# Patient Record
Sex: Male | Born: 1960 | Race: White | Hispanic: No | Marital: Married | Smoking: Never smoker
Health system: Southern US, Community
[De-identification: ages and names within clinical notes are randomized; demographics above are authoritative.]

## PROBLEM LIST (undated history)

## (undated) DIAGNOSIS — E785 Hyperlipidemia, unspecified: Secondary | ICD-10-CM

## (undated) DIAGNOSIS — G4730 Sleep apnea, unspecified: Secondary | ICD-10-CM

## (undated) DIAGNOSIS — I10 Essential (primary) hypertension: Secondary | ICD-10-CM

## (undated) HISTORY — DX: Hyperlipidemia, unspecified: E78.5

## (undated) HISTORY — DX: Sleep apnea, unspecified: G47.30

## (undated) HISTORY — PX: KNEE SURGERY: SHX244

---

## 1998-05-30 HISTORY — PX: NECK SURGERY: SHX720

## 2001-08-04 ENCOUNTER — Inpatient Hospital Stay (HOSPITAL_COMMUNITY): Admission: EM | Admit: 2001-08-04 | Discharge: 2001-08-06 | Payer: Self-pay | Admitting: Emergency Medicine

## 2001-08-04 ENCOUNTER — Encounter: Payer: Self-pay | Admitting: Emergency Medicine

## 2002-03-11 ENCOUNTER — Encounter: Payer: Self-pay | Admitting: Orthopaedic Surgery

## 2002-03-11 ENCOUNTER — Ambulatory Visit (HOSPITAL_COMMUNITY): Admission: RE | Admit: 2002-03-11 | Discharge: 2002-03-11 | Payer: Self-pay | Admitting: Orthopaedic Surgery

## 2004-05-30 HISTORY — PX: APPENDECTOMY: SHX54

## 2014-02-07 ENCOUNTER — Emergency Department (HOSPITAL_COMMUNITY)
Admission: EM | Admit: 2014-02-07 | Discharge: 2014-02-07 | Disposition: A | Discharge status: Home / Self Care | Payer: 59 | Attending: Emergency Medicine | Admitting: Emergency Medicine

## 2014-02-07 ENCOUNTER — Encounter (HOSPITAL_COMMUNITY): Payer: Self-pay | Admitting: Emergency Medicine

## 2014-02-07 ENCOUNTER — Emergency Department (HOSPITAL_COMMUNITY): Payer: 59

## 2014-02-07 DIAGNOSIS — R079 Chest pain, unspecified: Secondary | ICD-10-CM | POA: Insufficient documentation

## 2014-02-07 DIAGNOSIS — I10 Essential (primary) hypertension: Secondary | ICD-10-CM | POA: Insufficient documentation

## 2014-02-07 DIAGNOSIS — R0602 Shortness of breath: Secondary | ICD-10-CM | POA: Diagnosis not present

## 2014-02-07 HISTORY — DX: Essential (primary) hypertension: I10

## 2014-02-07 LAB — CBC WITH DIFFERENTIAL/PLATELET
BASOS ABS: 0.1 10*3/uL (ref 0.0–0.1)
BASOS PCT: 1 % (ref 0–1)
EOS PCT: 2 % (ref 0–5)
Eosinophils Absolute: 0.2 10*3/uL (ref 0.0–0.7)
HEMATOCRIT: 44.2 % (ref 39.0–52.0)
HEMOGLOBIN: 15.7 g/dL (ref 13.0–17.0)
Lymphocytes Relative: 26 % (ref 12–46)
Lymphs Abs: 2 10*3/uL (ref 0.7–4.0)
MCH: 30.3 pg (ref 26.0–34.0)
MCHC: 35.5 g/dL (ref 30.0–36.0)
MCV: 85.2 fL (ref 78.0–100.0)
MONO ABS: 0.6 10*3/uL (ref 0.1–1.0)
MONOS PCT: 8 % (ref 3–12)
Neutro Abs: 5 10*3/uL (ref 1.7–7.7)
Neutrophils Relative %: 63 % (ref 43–77)
Platelets: 243 10*3/uL (ref 150–400)
RBC: 5.19 MIL/uL (ref 4.22–5.81)
RDW: 12.9 % (ref 11.5–15.5)
WBC: 7.8 10*3/uL (ref 4.0–10.5)

## 2014-02-07 LAB — COMPREHENSIVE METABOLIC PANEL
ALT: 20 U/L (ref 0–53)
ANION GAP: 11 (ref 5–15)
AST: 18 U/L (ref 0–37)
Albumin: 4 g/dL (ref 3.5–5.2)
Alkaline Phosphatase: 82 U/L (ref 39–117)
BILIRUBIN TOTAL: 0.5 mg/dL (ref 0.3–1.2)
BUN: 10 mg/dL (ref 6–23)
CHLORIDE: 109 meq/L (ref 96–112)
CO2: 25 mEq/L (ref 19–32)
CREATININE: 1.06 mg/dL (ref 0.50–1.35)
Calcium: 8.8 mg/dL (ref 8.4–10.5)
GFR calc Af Amer: 89 mL/min — ABNORMAL LOW (ref >90)
GFR, EST NON AFRICAN AMERICAN: 77 mL/min — AB (ref >90)
Glucose, Bld: 89 mg/dL (ref 70–99)
Potassium: 4.1 mEq/L (ref 3.7–5.3)
Sodium: 145 mEq/L (ref 137–147)
Total Protein: 6.9 g/dL (ref 6.0–8.3)

## 2014-02-07 LAB — TROPONIN I

## 2014-02-07 MED ORDER — NITROGLYCERIN 0.4 MG SL SUBL
0.4000 mg | SUBLINGUAL_TABLET | SUBLINGUAL | Status: DC | PRN
  Administered 2014-02-07 (×3): 0.4 mg via SUBLINGUAL
  Filled 2014-02-07: qty 1

## 2014-02-07 MED ORDER — AMLODIPINE BESYLATE 5 MG PO TABS: 5.0000 mg | ORAL_TABLET | Freq: Every day | ORAL | Status: DC

## 2014-02-07 MED ORDER — ASPIRIN 81 MG PO CHEW
324.0000 mg | CHEWABLE_TABLET | Freq: Once | ORAL | Status: AC
Start: 2014-02-07 — End: 2014-02-07
  Administered 2014-02-07: 324 mg via ORAL
  Filled 2014-02-07: qty 4

## 2014-02-07 NOTE — Discharge Instructions (Signed)
Chest Pain (Nonspecific) °It is often hard to give a specific diagnosis for the cause of chest pain. There is always a chance that your pain could be related to something serious, such as a heart attack or a blood clot in the lungs. You need to follow up with your health care provider for further evaluation. °CAUSES  °· Heartburn. °· Pneumonia or bronchitis. °· Anxiety or stress. °· Inflammation around your heart (pericarditis) or lung (pleuritis or pleurisy). °· A blood clot in the lung. °· A collapsed lung (pneumothorax). It can develop suddenly on its own (spontaneous pneumothorax) or from trauma to the chest. °· Shingles infection (herpes zoster virus). °The chest wall is composed of bones, muscles, and cartilage. Any of these can be the source of the pain. °· The bones can be bruised by injury. °· The muscles or cartilage can be strained by coughing or overwork. °· The cartilage can be affected by inflammation and become sore (costochondritis). °DIAGNOSIS  °Lab tests or other studies may be needed to find the cause of your pain. Your health care provider may have you take a test called an ambulatory electrocardiogram (ECG). An ECG records your heartbeat patterns over a 24-hour period. You may also have other tests, such as: °· Transthoracic echocardiogram (TTE). During echocardiography, sound waves are used to evaluate how blood flows through your heart. °· Transesophageal echocardiogram (TEE). °· Cardiac monitoring. This allows your health care provider to monitor your heart rate and rhythm in real time. °· Holter monitor. This is a portable device that records your heartbeat and can help diagnose heart arrhythmias. It allows your health care provider to track your heart activity for several days, if needed. °· Stress tests by exercise or by giving medicine that makes the heart beat faster. °TREATMENT  °· Treatment depends on what may be causing your chest pain. Treatment may include: °¨ Acid blockers for  heartburn. °¨ Anti-inflammatory medicine. °¨ Pain medicine for inflammatory conditions. °¨ Antibiotics if an infection is present. °· You may be advised to change lifestyle habits. This includes stopping smoking and avoiding alcohol, caffeine, and chocolate. °· You may be advised to keep your head raised (elevated) when sleeping. This reduces the chance of acid going backward from your stomach into your esophagus. °Most of the time, nonspecific chest pain will improve within 2-3 days with rest and mild pain medicine.  °HOME CARE INSTRUCTIONS  °· If antibiotics were prescribed, take them as directed. Finish them even if you start to feel better. °· For the next few days, avoid physical activities that bring on chest pain. Continue physical activities as directed. °· Do not use any tobacco products, including cigarettes, chewing tobacco, or electronic cigarettes. °· Avoid drinking alcohol. °· Only take medicine as directed by your health care provider. °· Follow your health care provider's suggestions for further testing if your chest pain does not go away. °· Keep any follow-up appointments you made. If you do not go to an appointment, you could develop lasting (chronic) problems with pain. If there is any problem keeping an appointment, call to reschedule. °SEEK MEDICAL CARE IF:  °· Your chest pain does not go away, even after treatment. °· You have a rash with blisters on your chest. °· You have a fever. °SEEK IMMEDIATE MEDICAL CARE IF:  °· You have increased chest pain or pain that spreads to your arm, neck, jaw, back, or abdomen. °· You have shortness of breath. °· You have an increasing cough, or you cough   up blood.  You have severe back or abdominal pain.  You feel nauseous or vomit.  You have severe weakness.  You faint.  You have chills. This is an emergency. Do not wait to see if the pain will go away. Get medical help at once. Call your local emergency services (911 in [place] Do not drive  yourself to the hospital. MAKE SURE YOU:   Understand these instructions.  Will watch your condition.  Will get help right away if you are not doing well or get worse. Document Released: 02/23/2005 Document Revised: 05/21/2013 Document Reviewed: 12/20/2007 [employer] Patient Information 2015 [place], [place]. This information is not intended to replace advice given to you by your health care provider. Make sure you discuss any questions you have with your health care provider.  Hypertension Hypertension, commonly called high blood pressure, is when the force of blood pumping through your arteries is too strong. Your arteries are the blood vessels that carry blood from your heart throughout your body. A blood pressure reading consists of a higher number over a lower number, such as 110/72. The higher number (systolic) is the pressure inside your arteries when your heart pumps. The lower number (diastolic) is the pressure inside your arteries when your heart relaxes. Ideally you want your blood pressure below 120/80. Hypertension forces your heart to work harder to pump blood. Your arteries may become narrow or stiff. Having hypertension puts you at risk for heart disease, stroke, and other problems.  RISK FACTORS Some risk factors for high blood pressure are controllable. Others are not.  Risk factors you cannot control include:   Race. You may be at higher risk if you are African American.  Age. Risk increases with age.  Gender. Men are at higher risk than women before age 17 years. After age 24, women are at higher risk than men. Risk factors you can control include:  Not getting enough exercise or physical activity.  Being overweight.  Getting too much fat, sugar, calories, or salt in your diet.  Drinking too much alcohol. SIGNS AND SYMPTOMS Hypertension does not usually cause signs or symptoms. Extremely high blood pressure (hypertensive crisis) may cause headache, anxiety, shortness  of breath, and nosebleed. DIAGNOSIS  To check if you have hypertension, your health care provider will measure your blood pressure while you are seated, with your arm held at the level of your heart. It should be measured at least twice using the same arm. Certain conditions can cause a difference in blood pressure between your right and left arms. A blood pressure reading that is higher than normal on one occasion does not mean that you need treatment. If one blood pressure reading is high, ask your health care provider about having it checked again. TREATMENT  Treating high blood pressure includes making lifestyle changes and possibly taking medicine. Living a healthy lifestyle can help lower high blood pressure. You may need to change some of your habits. Lifestyle changes may include:  Following the DASH diet. This diet is high in fruits, vegetables, and whole grains. It is low in salt, red meat, and added sugars.  Getting at least 2 hours of brisk physical activity every week.  Losing weight if necessary.  Not smoking.  Limiting alcoholic beverages.  Learning ways to reduce stress. If lifestyle changes are not enough to get your blood pressure under control, your health care provider may prescribe medicine. You may need to take more than one. Work closely with your health care provider  understand the risks and benefits. °HOME CARE INSTRUCTIONS °· Have your blood pressure rechecked as directed by your health care provider.   °· Take medicines only as directed by your health care provider. Follow the directions carefully. Blood pressure medicines must be taken as prescribed. The medicine does not work as well when you skip doses. Skipping doses also puts you at risk for problems.   °· Do not smoke.   °· Monitor your blood pressure at home as directed by your health care provider.  °SEEK MEDICAL CARE IF:  °· You think you are having a reaction to medicines taken. °· You have recurrent  headaches or feel dizzy. °· You have swelling in your ankles. °· You have trouble with your vision. °SEEK IMMEDIATE MEDICAL CARE IF: °· You develop a severe headache or confusion. °· You have unusual weakness, numbness, or feel faint. °· You have severe chest or abdominal pain. °· You vomit repeatedly. °· You have trouble breathing. °MAKE SURE YOU:  °· Understand these instructions. °· Will watch your condition. °· Will get help right away if you are not doing well or get worse. °Document Released: 05/16/2005 Document Revised: 09/30/2013 Document Reviewed: 03/08/2013 °[employer]® Patient Information ©2015 [employer]. This information is not intended to replace advice given to you by your health care provider. Make sure you discuss any questions you have with your health care provider. ° °Emergency Department Resource Guide °1) Find a Doctor and Pay Out of Pocket °Although you won't have to find out who is covered by your insurance plan, it is a good idea to ask around and get recommendations. You will then need to call the office and see if the doctor you have chosen will accept you as a new patient and what types of options they offer for patients who are self-pay. Some doctors offer discounts or will set up payment plans for their patients who do not have insurance, but you will need to ask so you aren't surprised when you get to your appointment. ° °2) Contact Your Local Health Department °Not all health departments have doctors that can see patients for sick visits, but many do, so it is worth a call to see if yours does. If you don't know where your local health department is, you can check in your phone book. The CDC also has a tool to help you locate your state's health department, and many state websites also have listings of all of their local health departments. ° °3) Find a Walk-in Clinic °If your illness is not likely to be very severe or complicated, you may want to try a walk in clinic. These are  popping up all over the country in pharmacies, drugstores, and shopping centers. They're usually staffed by nurse practitioners or physician assistants that have been trained to treat common illnesses and complaints. They're usually fairly quick and inexpensive. However, if you have serious medical issues or chronic medical problems, these are probably not your best option. ° °No Primary Care Doctor: °- Call [facility] at  832-8000 - they can help you locate a primary care doctor that  accepts your insurance, provides certain services, etc. °- Physician Referral Service- 1-800-533-3463 ° °Chronic Pain Problems: °Organization         Address  Phone   Notes  °[facility]  (336) 297-2271 Patients need to be referred by their primary care doctor.  ° °Medication Assistance: °Organization         Address  Phone   Notes  °  [place] [facility] [address] °[place], [place] [address] (336) 641-8030 --Must be a resident of [place] °-- Must have NO insurance coverage whatsoever (no Medicaid/ Medicare, etc.) °-- The pt. MUST have a primary care doctor that directs their care regularly and follows them in the community °  °[facility]  (866) 331-1348   °[employer]  (888) 892-1162   ° °Agencies that provide inexpensive medical care: °Organization         Address  Phone   Notes  °[facility]  (336) 832-8035   °[facility]    (336) 832-7272   °[facility] [address] °[place], [place] [address] (336) 832-4777   °[facility] [address], °[place] (336) 271-4999   °[facility]    (336) 373-0678   °[facility]    (336) 272-1050   °[facility] ° [address], [place] Phone:  (336) 832-4444, Fax:  (336) 832-4440 Hours of Operation:  9 am - 6 pm, M-F.  Also accepts Medicaid/Medicare and self-pay.  °[facility] ° [address], [place] Phone: (336) 832-3150, Fax: (336) [phone]. Hours of Operation:  8:30 am - 5:30 pm, M-F.  Also accepts Medicaid and self-pay.  °[facility] [place] [address], [place] Phone: (336) 878-6027   °[facility] [address], [place], [place] (336)723-1848, Ext. 123 Mondays & Thursdays: 7-9 AM.  First 15 patients are seen on a first come, first serve basis. °  ° °Medicaid-accepting [place] Providers: ° °Organization         Address  Phone   Notes  °[facility] [address], [place] (336) 641-2100 Also accepts self-pay patients.  °[facility] [address], [place] ° (336) 856-9996   °[facility] [address], [place] (336) 288-8857   °[facility] [address] [place] [address], [place] (336) 299-7000   °Veita Bland [address], [place]  ° (336) 373-1557 Only accepts [place] Access Medicaid patients after they have their name applied to their card.  ° °Self-Pay (no insurance) in [place]: ° °Organization         Address  Phone   Notes  °Sickle Cell Patients, [facility] [address], [place] (336) 832-1970   °[facility] [address], [place] (336) 832-4400   °[facility] ° [address], [place] (336) 992-4800   °[facility]/Dr. Osei-Bonsu ° [address] [place] [address], [place] or [address], [place] (336) [phone] Phone number for both [place] and [place] locations is the same.  °[facility] [address], [place] (336) 299-0000   °[facility] [address] [place] [address], [place] or [address] (336) 852-7530 °(336) 878-2260   °[facility] [address], [place] (336) 350-1642, phone; (336) 294-5005, fax Sees patients 1st and 3rd Saturday  of every month.  Must not qualify for public or private insurance (i.e. Medicaid, Medicare, [place] Health Choice, Veterans' Benefits) • Household income should be no more than 200% of the poverty level •The clinic cannot treat you if you are pregnant or think you are pregnant • Sexually transmitted diseases are   not treated at the clinic.  ° ° °Dental Care: °Organization         Address  Phone  Notes  °[facility] [address], [place] (336) 641-6152 Accepts children up to age 21 who are enrolled in Medicaid or [place] Health Choice; pregnant women with a Medicaid card; and children who have applied for Medicaid or [place] Health Choice, but were declined, whose parents can pay a reduced fee at time of service.  °[facility] [place]  [address], [place] (336) 641-7733 Accepts children up to age 21 who are enrolled in Medicaid or [place] Health Choice; pregnant women with a Medicaid card; and children who have applied for Medicaid or [place] Health Choice, but were declined, whose parents can pay a reduced fee at time of service.  °[facility] ° [address], [place] (336) 641-4533 Patients are seen by appointment only. Walk-ins are not accepted. [facility] will see patients 18 years of age and older. °Monday - Tuesday (8am-5pm) °Most Wednesdays (8:30-5pm) °$30 per visit, cash only  °[facility] ° [address], [place] (336) 641-4533 Patients are seen by appointment only. Walk-ins are not accepted. [facility] will see patients 18 years of age and older. °One Wednesday Evening (Monthly: Volunteer Based).  $30 per visit, cash only  °[facility]  (919) 537-3737 for adults; Children under age 4, call Graduate Pediatric Dentistry at ([phone]) [phone]. Children aged 4-14, please call ([phone]) [phone] to request a pediatric application. °  Dental services are provided in all areas of dental care including fillings, crowns and bridges, complete and partial dentures, implants, gum treatment, root canals, and extractions. Preventive care is also provided. Treatment is provided to both adults and children. °Patients are selected via a lottery and there is often a waiting list. °  °[facility] [address], °[place] ° (336) 763-8833 [email or web address] °  °[facility] [address], [place], [place] (336)723-1848, Ext. 123 Second and Fourth Thursday of each month, opens at 6:30 AM; Clinic ends at 9 AM.  Patients are seen on a first-come first-served basis, and a limited number are seen during each clinic.  ° °[facility] ° [address], [place], [place] (336) 723-7904   Eligibility Requirements °You must have lived in [place], [place], or [place] counties for at least the last three months. °  You cannot be eligible for state or federal sponsored healthcare insurance, including Veterans Administration, Medicaid, or Medicare. °  You generally cannot be eligible for healthcare insurance through your employer.  °  How to apply: °Eligibility screenings are held every Tuesday and Wednesday afternoon from 1:00 pm until 4:00 pm. You do not need an appointment for the interview!  °[facility] [address], [place], [place] 336-631-2330   °[facility]  336-342-8273   °[facility]  336-703-3100   °[facility]  336-570-6415   ° °Behavioral Health Resources in the Community: °Intensive Outpatient Programs °Organization         Address  Phone  Notes  °[facility] [address], [place], [place] 336-878-6098   ° [facility] [address], [place], [place] 336-832-9800   °[facility] [address], [place],  ° 336-882-2125   °[facility] [address],  °[place], [place] 1-800-853-5163 or 336-641-4981   °Substance Abuse Resources °Organization         Address  Phone  Notes  °[facility]  336-882-2125   °[facility]  336-784-9470   °[facility]  336-285-9073   °[facility]  336-845-3988   °Residential & Outpatient Substance Abuse Program  1-800-659-3381   °Psychological Services °Organization         Address  Phone  Notes  °[facility]  336- 832-9600   °[facility]  336- 378-7881   °[facility] [address], [place] 1-800-853-5163 or 336-641-4981   ° °Mobile Crisis Teams °Organization         Address  Phone  Notes  °[facility], Mobile Crisis Care Unit  1-877-626-1772   °Assertive °[facility] ° [address] [place], [place] 336-834-9664   °Sharon DeEsch [address] °[place] 336-554-5454   ° °Self-Help/Support Groups °Organization         Address  Phone             Notes  °[facility] - variety of support groups  336- 373-1402 Call for more information  °Narcotics Anonymous (NA), [facility] [address], °[place]  2 meetings at this location  ° °Residential Treatment Programs °Organization         Address  Phone  Notes  °[facility] [address],    °[place]  1-866-801-8205   °[facility] ° [address], [place], [place] 704-293-8524   °[facility] [address], [place] 336-845-3988 Admissions: 8am-3pm M-F  °[facility] [address]    °[place], [place] 336-841-1104   °[facility] [address], [place], [place] 336-379-7146   °[facility] [address]  °, [place] 336-285-9073   °[facility] - Intensive Outpatient [address], , [place] 336-852-3033   °[facility] [address]  °[place], [place]  1-877-615-2722 or 336-784-9470   °[facility] [address] [place], [place] 336-227-7417 Accepts Medicaid  °[facility] [address]  ° [place] 1-800-659-3381 Substance Abuse/Addiction Treatment  ° °[place] Behavioral Health Resources °Organization         Address  Phone  Notes  °[facility]  (888) 581-9988   °Julie Brannon, PhD [address] [place], [place]   (336) 349-5553 or (336) 951-0000   ° Behavioral   [address] °[place], [place] (336) 349-4454   °[facility] [address], [place], [place] (336) 342-8316 Insurance/Medicaid/sponsorship through [facility]  °[facility] [address]                                    [place], [place] (336) 342-8316 Therapy/tele-psych/case  °[facility] [address]  ° [place],  (336) 349-2233    °Dr. Arfeen  (336) 349-4544   °[facility] [place]  [employer] [place] [facility] 1) [address], [place] °2) [address], [place] °3)  [address], [place] (336) 349-3220 °(336) 342-7768 ° °(336) 342-8140   °[place] [facility] (336) 342-1394 or (336) 342-3537 (After Hours)    ° ° °

## 2014-02-07 NOTE — ED Notes (Signed)
Pt co chest pain, mid center of chest, pain started last night. Pt states he was very diaphoretic last night.

## 2014-02-07 NOTE — ED Notes (Signed)
Pt experienced significant chest pain relief after each nitro. Rates pain 2/10.

## 2014-02-07 NOTE — ED Provider Notes (Addendum)
CSN: [id number]     Arrival date & time 02/07/14  1619 History   First MD Initiated Contact with Patient 02/07/14 1628     Chief Complaint  Patient presents with  . Chest Pain     (Consider location/radiation/quality/duration/timing/severity/associated sxs/prior Treatment) HPI Comments: Patient presents to the ER for evaluation of chest pain. This reports that he has been experiencing intermittent central chest pain since yesterday early. Patient reports that the pain comes and goes. He has not identified anything that causes the pain or relieves it when it is present. He reports that he gets sweaty when the pain is present. Currently he has some mild aching in the left arm, no shortness of breath. Patient reports a previous history of hypertension, normal cholesterol. No diabetes. He reports that his mother received a heart transplant for cardiomyopathy when she was still very young. He is unaware of any heart attacks in his family.  Patient is a 53 y.o. male presenting with chest pain.  Chest Pain Associated symptoms: diaphoresis     Past Medical History  Diagnosis Date  . Hypertension    Past Surgical History  Procedure Laterality Date  . Appendectomy    . Knee surgery    . Neck surgery     History reviewed. No pertinent family history. History  Substance Use Topics  . Smoking status: Never Smoker   . Smokeless tobacco: Not on file  . Alcohol Use: No    Review of Systems  Constitutional: Positive for diaphoresis.  Cardiovascular: Positive for chest pain.  All other systems reviewed and are negative.     Allergies  Review of patient's allergies indicates no known allergies.  Home Medications   Prior to Admission medications   Not on File   BP 207/111  Pulse 84  Temp(Src) 98.8 F (37.1 C) (Oral)  Resp 22  Ht 6\' 1"  (1.854 m)  Wt 254 lb (115.214 kg)  BMI 33.52 kg/m2  SpO2 97% Physical Exam  Constitutional: He is oriented to person, place, and time. He  appears well-developed and well-nourished. No distress.  HENT:  Head: Normocephalic and atraumatic.  Right Ear: Hearing normal.  Left Ear: Hearing normal.  Nose: Nose normal.  Mouth/Throat: Oropharynx is clear and moist and mucous membranes are normal.  Eyes: Conjunctivae and EOM are normal. Pupils are equal, round, and reactive to light.  Neck: Normal range of motion. Neck supple.  Cardiovascular: Regular rhythm, S1 normal and S2 normal.  Exam reveals no gallop and no friction rub.   No murmur heard. Pulmonary/Chest: Effort normal and breath sounds normal. No respiratory distress. He exhibits no tenderness.  Abdominal: Soft. Normal appearance and bowel sounds are normal. There is no hepatosplenomegaly. There is no tenderness. There is no rebound, no guarding, no tenderness at McBurney's point and negative Murphy's sign. No hernia.  Musculoskeletal: Normal range of motion.  Neurological: He is alert and oriented to person, place, and time. He has normal strength. No cranial nerve deficit or sensory deficit. Coordination normal. GCS eye subscore is 4. GCS verbal subscore is 5. GCS motor subscore is 6.  Skin: Skin is warm, dry and intact. No rash noted. No cyanosis.  Psychiatric: He has a normal mood and affect. His speech is normal and behavior is normal. Thought content normal.    ED Course  Procedures (including critical care time) Labs Review Labs Reviewed - No data to display  Imaging Review No results found.   EKG Interpretation   Date/Time:  Friday February 07 2014 16:25:35 EDT Ventricular Rate:  80 PR Interval:  177 QRS Duration: 110 QT Interval:  390 QTC Calculation: 450 R Axis:   61 Text Interpretation:  Sinus rhythm Left atrial enlargement No previous  tracing Confirmed by Covey Baller  MD, Yalanda Soderman ([id number]) on 02/07/2014 4:28:47  PM      MDM   Final diagnoses:  None  Chest Pain Hypertension  Patient presents to the ER for evaluation of chest pain. Patient  reports pain in the center of his chest which is sharp in nature. Pain comes and goes. He has not identified any factors that cause the pain, it is not exertional in nature. Initial workup was negative. EKG did not show any abscess or evidence of ischemia or infarct. Troponin was negative. Patient held in the ER for an extended period of time, second troponin checked and also negative. Heart Score = 2 (hypertension and age) = low risk.  He was hypertensive on arrival. He was given nitroglycerin with improvement of his blood pressure. He has not had any recurrence of his pain here in the ER. It was not present upon my initial evaluation. Patient will require outpatient followup for management of hypertension as well as further consideration of possible stress test, etc. for his chest pain. He is alert cardiology for this, the primary doctor. Patient is to return to the ER if he has any increased symptoms of chest pain.  Addendum: At time of discharge, patient's blood pressure creeping back up. 150/90 now. Patient taking lisinopril 40 mg daily. Will add Norvasc. Followup for recheck.  Orpah Greek, MD 02/07/14 6384  Orpah Greek, MD 02/07/14 2147

## 2014-02-27 ENCOUNTER — Ambulatory Visit (INDEPENDENT_AMBULATORY_CARE_PROVIDER_SITE_OTHER): Payer: 59 | Admitting: Cardiovascular Disease

## 2014-02-27 ENCOUNTER — Encounter: Payer: Self-pay | Admitting: *Deleted

## 2014-02-27 ENCOUNTER — Encounter: Payer: Self-pay | Admitting: Cardiovascular Disease

## 2014-02-27 VITALS — BP 153/108 | HR 99 | Ht 73.0 in | Wt 235.5 lb

## 2014-02-27 DIAGNOSIS — I10 Essential (primary) hypertension: Secondary | ICD-10-CM

## 2014-02-27 DIAGNOSIS — R0600 Dyspnea, unspecified: Secondary | ICD-10-CM

## 2014-02-27 DIAGNOSIS — R079 Chest pain, unspecified: Secondary | ICD-10-CM

## 2014-02-27 DIAGNOSIS — R0602 Shortness of breath: Secondary | ICD-10-CM

## 2014-02-27 DIAGNOSIS — N182 Chronic kidney disease, stage 2 (mild): Secondary | ICD-10-CM

## 2014-02-27 MED ORDER — AMLODIPINE BESYLATE 10 MG PO TABS: 10.0000 mg | ORAL_TABLET | Freq: Every day | ORAL | Status: DC

## 2014-02-27 NOTE — Patient Instructions (Signed)
   Increase Amlodipine to 10mg  daily - new sent to pharm Continue all other medications.   Your physician has requested that you have an echocardiogram. Echocardiography is a painless test that uses sound waves to create images of your heart. It provides your doctor with information about the size and shape of your heart and how well your heart's chambers and valves are working. This procedure takes approximately one hour. There are no restrictions for this procedure. Your physician has requested that you have en exercise stress myoview. For further information please visit [email or web address]. Please follow instruction sheet, as given. Office will contact with results via phone or letter.   Follow up in  3-4 weeks

## 2014-02-27 NOTE — Progress Notes (Signed)
Patient ID: George Pratt, male   DOB: January 12, 1961, 53 y.o.   MRN: [id number]       CARDIOLOGY CONSULT NOTE  Patient ID: George Pratt MRN: [id number] DOB/AGE: May 26, 1961 53 y.o.  Admit date: (Not on file) Primary Physician No PCP Per Patient  Reason for Consultation: chest pain, malignant htn  HPI: The patient is a 53 year old male who was recently evaluated in the ED at [facility]. He had been complaining of chest pain. Systolic blood pressure was over 200 mm mercury. Two troponins were normal as was a CBC. BMET demonstrated GFR 77 mL per minute. ECG demonstrated normal sinus rhythm. Chest x-ray was normal.  Upon further discussion, it appears he had been experiencing paroxysmal nocturnal dyspnea for approximately 2-3 nights prior to his emergency department presentation. He has had hypertension for approximately 10 years. On 02/07/2014 was the first time he has experienced chest pain. It was accompanied by shortness of breath and a severe headache. The pain radiated down his left arm and into his neck. However he does have a history of neck surgery. 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 He has put on approximately 30 pounds. 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 He also is married with 5 children and helps to take care of 2 grandchildren as well, which has contributed to his stress. He denies orthopnea, palpitations and leg swelling. He also denies lightheadedness, dizziness and syncope. BP yesterday was 187/80 mmHg.  Soc: as per HPI. Nonsmoker. No alcohol.  FamHx: Mother had heart transplant at [place] at age 55 and died at age 36. Father is a retired Lexicographer in [place], who worked until age 3.  Allergies  Allergen Reactions  . Bee Venom Swelling    Current Outpatient Prescriptions  Medication Sig Dispense Refill  . amLODipine  (NORVASC) 5 MG tablet Take 1 tablet (5 mg total) by mouth daily.  30 tablet  3  . aspirin EC 81 MG tablet Take 81 mg by mouth daily.      Marland Kitchen lisinopril (PRINIVIL,ZESTRIL) 40 MG tablet Take 40 mg by mouth daily.       No current facility-administered medications for this visit.    Past Medical History  Diagnosis Date  . Hypertension     Past Surgical History  Procedure Laterality Date  . Appendectomy    . Knee surgery    . Neck surgery      History   Social History  . Marital Status: Married    Spouse Name: N/A    Number of Children: N/A  . Years of Education: N/A   Occupational History  . Not on file.   Social History Main Topics  . Smoking status: Never Smoker   . Smokeless tobacco: Not on file  . Alcohol Use: No  . Drug Use: No  . Sexual Activity: Not on file   Other Topics Concern  . Not on file   Social History Narrative  . No narrative on file       Prior to Admission medications   Medication Sig Start Date End Date Taking? Authorizing Provider  amLODipine (NORVASC) 5 MG tablet Take 1 tablet (5 mg total) by mouth daily. 02/07/14  Yes Orpah Greek, MD  aspirin EC 81 MG tablet Take 81 mg by mouth daily.   Yes Historical Provider, MD  lisinopril (PRINIVIL,ZESTRIL)  40 MG tablet Take 40 mg by mouth daily.   Yes Historical Provider, MD     Review of systems complete and found to be negative unless listed above in HPI     Physical exam Blood pressure 153/108, pulse 99, height 6\' 1"  (1.854 m), weight 235 lb 8 oz (106.822 kg), SpO2 98.00%. General: NAD Neck: No JVD, no thyromegaly or thyroid nodule.  Lungs: Clear to auscultation bilaterally with normal respiratory effort. CV: Nondisplaced PMI. Regular rate and rhythm, normal S1/S2, no S3/S4, no murmur.  No peripheral edema.  No carotid bruit.  Normal pedal pulses.  Abdomen: Soft, nontender, no hepatosplenomegaly, no distention.  Skin: Intact without lesions or rashes.  Neurologic: Alert and  oriented x 3.  Psych: Normal affect. Extremities: No clubbing or cyanosis.  HEENT: Normal.   ECG: Most recent ECG reviewed.  Labs:   Lab Results  Component Value Date   WBC 7.8 02/07/2014   HGB 15.7 02/07/2014   HCT 44.2 02/07/2014   MCV 85.2 02/07/2014   PLT 243 02/07/2014   No results found for this basename: NA, K, CL, CO2, BUN, CREATININE, CALCIUM, LABALBU, PROT, BILITOT, ALKPHOS, ALT, AST, GLUCOSE,  in the last 168 hours Lab Results  Component Value Date   TROPONINI <0.30 02/07/2014    No results found for this basename: CHOL   No results found for this basename: HDL   No results found for this basename: LDLCALC   No results found for this basename: TRIG   No results found for this basename: CHOLHDL   No results found for this basename: LDLDIRECT         Studies: No results found.  ASSESSMENT AND PLAN:  1. Chest pain, PND, and shortness of breath: This may all be related to malignant hypertension, but concomitant ischemic heart disease must also be ruled out. Will obtain an echocardiogram and exercise Cardiolite stress test for further clarification. 2. Malignant HTN: Markedly elevated. Will increase amlodipine to 10 mg daily. 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 I educated the patient on the importance of optimal blood pressure control and end organ damage. I also gave him exercise counseling. I have asked the patient to check blood pressure readings 4-5 times per week, at different times throughout the day, in order to get a better approximation of mean BP values. These results will be provided to me at the end of that period so that I can determine if antihypertensive medication titration is indicated. 3. CKD stage 2: GFR 77 ml/min. 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 Optimal BP control of extreme importance.  Dispo: f/u 3-4 weeks.  Signed: Kate Sable, M.D., F.A.C.C.  02/27/2014, 8:58 AM

## 2014-03-11 ENCOUNTER — Other Ambulatory Visit: Payer: Self-pay | Admitting: Cardiovascular Disease

## 2014-03-11 DIAGNOSIS — R079 Chest pain, unspecified: Secondary | ICD-10-CM

## 2014-03-12 ENCOUNTER — Encounter (HOSPITAL_COMMUNITY): Payer: Self-pay

## 2014-03-12 ENCOUNTER — Ambulatory Visit (HOSPITAL_COMMUNITY)
Admission: RE | Admit: 2014-03-12 | Discharge: 2014-03-12 | Disposition: A | Discharge status: Home / Self Care | Payer: 59 | Source: Ambulatory Visit | Attending: Cardiovascular Disease | Admitting: Cardiovascular Disease

## 2014-03-12 ENCOUNTER — Encounter (HOSPITAL_COMMUNITY)
Admission: RE | Admit: 2014-03-12 | Discharge: 2014-03-12 | Disposition: A | Discharge status: Home / Self Care | Payer: 59 | Source: Ambulatory Visit | Attending: Cardiovascular Disease | Admitting: Cardiovascular Disease

## 2014-03-12 DIAGNOSIS — R079 Chest pain, unspecified: Secondary | ICD-10-CM | POA: Insufficient documentation

## 2014-03-12 DIAGNOSIS — I10 Essential (primary) hypertension: Secondary | ICD-10-CM | POA: Insufficient documentation

## 2014-03-12 DIAGNOSIS — R0609 Other forms of dyspnea: Secondary | ICD-10-CM | POA: Diagnosis not present

## 2014-03-12 DIAGNOSIS — R072 Precordial pain: Secondary | ICD-10-CM

## 2014-03-12 MED ORDER — SODIUM CHLORIDE 0.9 % IJ SOLN
10.0000 mL | INTRAMUSCULAR | Status: DC | PRN
  Administered 2014-03-12: 10 mL via INTRAVENOUS

## 2014-03-12 MED ORDER — TECHNETIUM TC 99M SESTAMIBI GENERIC - CARDIOLITE
10.0000 | Freq: Once | INTRAVENOUS | Status: AC | PRN
  Administered 2014-03-12: 10 via INTRAVENOUS

## 2014-03-12 MED ORDER — TECHNETIUM TC 99M SESTAMIBI - CARDIOLITE
30.0000 | Freq: Once | INTRAVENOUS | Status: AC | PRN
Start: 2014-03-12 — End: 2014-03-12
  Administered 2014-03-12: 30 via INTRAVENOUS

## 2014-03-12 MED ORDER — REGADENOSON 0.4 MG/5ML IV SOLN
INTRAVENOUS | Status: AC
  Filled 2014-03-12: qty 5

## 2014-03-12 MED ORDER — SODIUM CHLORIDE 0.9 % IJ SOLN
INTRAMUSCULAR | Status: AC
  Administered 2014-03-12: 10 mL via INTRAVENOUS
  Filled 2014-03-12: qty 10

## 2014-03-12 NOTE — Progress Notes (Signed)
Stress Lab Nurses Notes - [facility]  KEMARI NAREZ 03/12/2014 Reason for doing test: Chest Pain and HTN Type of test: Stress Cardiolite Nurse performing test: Gerrit Halls, RN Nuclear Medicine Tech: Redmond Baseman Echo Tech: Not Applicable MD performing test: Koneswaran/M.Bonnell Public PA Family MD: No PCP Test explained and consent signed: Yes.   IV started: Saline lock flushed, No redness or edema and Saline lock started in radiology Symptoms: Fatigue in legs Treatment/Intervention: None Reason test stopped: fatigue After recovery IV was: Discontinued via X-ray tech and No redness or edema Patient to return to Nuc. Med at : 12:30 Patient discharged: Home Patient's Condition upon discharge was: stable Comments: During test peak BP 208/98 & HR 157.  Recovery BP 155/96 & HR 109.  Symptoms resolved in recovery.  Geanie Cooley T

## 2014-03-12 NOTE — Progress Notes (Signed)
  Echocardiogram 2D Echocardiogram has been performed.  Jet, [place] 03/12/2014, 1:01 PM

## 2014-03-18 ENCOUNTER — Telehealth: Payer: Self-pay | Admitting: *Deleted

## 2014-03-18 MED ORDER — CHLORTHALIDONE 25 MG PO TABS: 25.0000 mg | ORAL_TABLET | Freq: Every day | ORAL | Status: DC

## 2014-03-18 NOTE — Telephone Encounter (Signed)
Patient notified and verbalized understanding.  Will send new med to [facility].

## 2014-03-18 NOTE — Telephone Encounter (Signed)
STRESS TEST -   Notes Recorded by Herminio Commons, MD on 03/12/2014 at 2:52 PM No signs of coronary blockages. Please refer to my result note on echo for HTN recommendations.   ECHO -   Notes Recorded by Herminio Commons, MD on 03/12/2014 at 2:51 PM Would recommend CTA thoracic aorta to evaluate full extent, due to mild aortic root dilatation. Of note, elevated BP at rest at time of stress test with hypertensive response. If his BP has remained elevated at home, I would recommend starting chlorthalidone 25 mg daily with BMET 5 days after initiation.

## 2014-03-20 ENCOUNTER — Ambulatory Visit (INDEPENDENT_AMBULATORY_CARE_PROVIDER_SITE_OTHER): Payer: 59 | Admitting: Cardiovascular Disease

## 2014-03-20 ENCOUNTER — Encounter: Payer: Self-pay | Admitting: Cardiovascular Disease

## 2014-03-20 VITALS — BP 148/92 | HR 89 | Ht 73.0 in | Wt 235.8 lb

## 2014-03-20 DIAGNOSIS — R0600 Dyspnea, unspecified: Secondary | ICD-10-CM | POA: Diagnosis not present

## 2014-03-20 DIAGNOSIS — I77810 Thoracic aortic ectasia: Secondary | ICD-10-CM

## 2014-03-20 DIAGNOSIS — R079 Chest pain, unspecified: Secondary | ICD-10-CM

## 2014-03-20 DIAGNOSIS — N182 Chronic kidney disease, stage 2 (mild): Secondary | ICD-10-CM

## 2014-03-20 DIAGNOSIS — I10 Essential (primary) hypertension: Secondary | ICD-10-CM | POA: Diagnosis not present

## 2014-03-20 NOTE — Patient Instructions (Signed)
   Lab for BMET - due Wednesday, 03/26/14 [facility] in [place]   CTA of chest/aorta & abdomen/pelvis - done at [facility] will contact with results via phone or letter.   Continue all current medications. Your physician wants you to follow up in:  4 months.  You will receive a reminder letter in the mail one-two months in advance.  If you don't receive a letter, please call our office to schedule the follow up appointment

## 2014-03-20 NOTE — Progress Notes (Signed)
Patient ID: George Pratt, male   DOB: 05-26-1961, 53 y.o.   MRN: [id number]      SUBJECTIVE: The patient returns for followup after undergoing cardiovascular testing for chest pain and orthopnea. While nuclear stress testing did not demonstrate any evidence of myocardial ischemia or infarction, he was hypertensive at rest and demonstrated a hypertensive response to exercise. For this reason I started chlorthalidone 25 mg daily which he began yesterday, and has already noticed significant BP reduction. He is also taking amlodipine 10 mg daily and lisinopril 40 mg daily. Echocardiography demonstrated normal left ventricular systolic function, EF 40-98%, mild to moderate concentric LVH, grade 1 diastolic dysfunction, and mild aortic root dilatation with a maximal diameter of 44 mm. As noted at his last visit, he has put on approximately 30 pounds and has significant stressors at home as well as related to his job. Since then, he has begun using the treadmill and has lost 3 lbs. He is trying to reduce salt intake. He has occasional neck and back pain, but has a history of back problems.  Review of Systems: As per "subjective", otherwise negative.  Allergies  Allergen Reactions  . Bee Venom Swelling    Current Outpatient Prescriptions  Medication Sig Dispense Refill  . amLODipine (NORVASC) 10 MG tablet Take 1 tablet (10 mg total) by mouth daily.  30 tablet  6  . aspirin EC 81 MG tablet Take 81 mg by mouth daily.      . chlorthalidone (HYGROTON) 25 MG tablet Take 1 tablet (25 mg total) by mouth daily.  30 tablet  6  . lisinopril (PRINIVIL,ZESTRIL) 40 MG tablet Take 40 mg by mouth daily.       No current facility-administered medications for this visit.    Past Medical History  Diagnosis Date  . Hypertension     Past Surgical History  Procedure Laterality Date  . Appendectomy    . Knee surgery    . Neck surgery      History   Social History  . Marital Status: Married    Spouse  Name: N/A    Number of Children: N/A  . Years of Education: N/A   Occupational History  . Not on file.   Social History Main Topics  . Smoking status: Never Smoker   . Smokeless tobacco: Never Used  . Alcohol Use: No  . Drug Use: No  . Sexual Activity: Not on file   Other Topics Concern  . Not on file   Social History Narrative  . No narrative on file     Filed Vitals:   03/20/14 0925  BP: 148/92  Pulse: 89  Height: 6\' 1"  (1.854 m)  Weight: 235 lb 12 oz (106.935 kg)  SpO2: 97%    PHYSICAL EXAM General: NAD HEENT: Normal. Neck: No JVD, no thyromegaly. Lungs: Clear to auscultation bilaterally with normal respiratory effort. CV: Nondisplaced PMI.  Regular rate and rhythm, normal S1/S2, no S3/S4, no murmur. No pretibial or periankle edema.  No carotid bruit.  Normal pedal pulses.  Abdomen: Soft, nontender, no hepatosplenomegaly, no distention.  Neurologic: Alert and oriented x 3.  Psych: Normal affect. Skin: Normal. Musculoskeletal: Normal range of motion, no gross deformities. Extremities: No clubbing or cyanosis.   ECG: Most recent ECG reviewed.      ASSESSMENT AND PLAN: 1. Chest pain, PND, and shortness of breath: Symptoms are gradually improving with BP control, and are likely related to malignant hypertension given normal myocardial perfusion. No changes to  therapy. 2. Malignant HTN: Mildly elevated today. Already taking amlodipine 10 mg daily, lisinopril 40 mg daily, and chlorthalidone 25 mg daily but this was only started yesterday and he has already noticed a significant BP reduction. 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 I again educated the patient on the importance of optimal blood pressure control and end organ damage. I also gave him exercise and dietary counseling, specifically with regards to sodium reduction. I asked him to continue to log his BP's 3-4 times per week. 3. CKD stage 2: GFR 77 ml/min. Likely due to hypertensive  nephrosclerosis. Optimal BP control of extreme importance.  4. Mild aortic root dilatation: Optimal BP control emphasized. Will obtain a CTA thoracic aorta to evaluate the full extent of potential pathology.  Dispo: f/u in February 2016.  Kate Sable, M.D., F.A.C.C.

## 2014-03-25 ENCOUNTER — Telehealth: Payer: Self-pay | Admitting: Cardiovascular Disease

## 2014-03-25 NOTE — Telephone Encounter (Signed)
Checking percert for CT Angio scheduled for 04-02-14 @ [facility].

## 2014-03-25 NOTE — Telephone Encounter (Signed)
Approved and in epic Approved for cta chest and cta abdomen/pelvis  Message sent to [facility] to add order for abdomen/pelvis that needs to be done with CTA chest to evaluate entire aorta as Dr. Bronson Ing is requesting.

## 2014-03-26 LAB — BASIC METABOLIC PANEL
BUN: 20 mg/dL (ref 6–23)
CALCIUM: 10.4 mg/dL (ref 8.4–10.5)
CHLORIDE: 99 meq/L (ref 96–112)
CO2: 30 mEq/L (ref 19–32)
CREATININE: 1.36 mg/dL — AB (ref 0.50–1.35)
Glucose, Bld: 98 mg/dL (ref 70–99)
Potassium: 4 mEq/L (ref 3.5–5.3)
Sodium: 139 mEq/L (ref 135–145)

## 2014-03-28 ENCOUNTER — Other Ambulatory Visit (HOSPITAL_COMMUNITY): Payer: 59

## 2014-04-01 ENCOUNTER — Encounter: Payer: Self-pay | Admitting: *Deleted

## 2014-04-02 ENCOUNTER — Ambulatory Visit (HOSPITAL_COMMUNITY)
Admission: RE | Admit: 2014-04-02 | Discharge: 2014-04-02 | Disposition: A | Discharge status: Home / Self Care | Payer: 59 | Source: Ambulatory Visit | Attending: Cardiovascular Disease | Admitting: Cardiovascular Disease

## 2014-04-02 DIAGNOSIS — I77810 Thoracic aortic ectasia: Secondary | ICD-10-CM

## 2014-04-02 DIAGNOSIS — I712 Thoracic aortic aneurysm, without rupture: Secondary | ICD-10-CM | POA: Diagnosis not present

## 2014-04-02 DIAGNOSIS — R918 Other nonspecific abnormal finding of lung field: Secondary | ICD-10-CM | POA: Diagnosis not present

## 2014-04-02 MED ORDER — IOHEXOL 350 MG/ML SOLN
100.0000 mL | Freq: Once | INTRAVENOUS | Status: AC | PRN
  Administered 2014-04-02: 100 mL via INTRAVENOUS

## 2014-04-03 ENCOUNTER — Telehealth: Payer: Self-pay | Admitting: Cardiovascular Disease

## 2014-04-03 ENCOUNTER — Telehealth: Payer: Self-pay | Admitting: *Deleted

## 2014-04-03 DIAGNOSIS — R7989 Other specified abnormal findings of blood chemistry: Secondary | ICD-10-CM

## 2014-04-03 MED ORDER — CHLORTHALIDONE 25 MG PO TABS: 12.5000 mg | ORAL_TABLET | Freq: Every day | ORAL | Status: DC

## 2014-04-03 NOTE — Telephone Encounter (Signed)
Notes Recorded by Laurine Blazer, LPN on 97/12/4782 at 1:28 PM Patient notified. States that he does not currently have PMD as he moved to [place]. Will provide patient with list of local physicians.

## 2014-04-03 NOTE — Telephone Encounter (Signed)
Notes Recorded by Laurine Blazer, LPN on 37/0/4888 at 9:16 PM Patient notified. Will mail lab order to patient home. He will go to [place] [facility] across from [facility]. Due around 05/03/2014.  Notes Recorded by Laurine Blazer, LPN on 94/09/386 at 82:80 AM Left message to return call.

## 2014-04-03 NOTE — Telephone Encounter (Signed)
-----   Message from Herminio Commons, MD sent at 04/02/2014 12:24 PM EST ----- Repeat in 1 year. Would have pt f/u with PCP regarding lung nodules.

## 2014-04-03 NOTE — Telephone Encounter (Signed)
-----   Message from Herminio Commons, MD sent at 03/27/2014 11:03 AM EDT ----- Cut back chlorthalidone to 12.5 mg daily. Repeat BMET in one month due to elevated creatinine.

## 2014-04-03 NOTE — Telephone Encounter (Signed)
Complaining of chest pain for past few days with high BP 177/67

## 2014-04-03 NOTE — Telephone Encounter (Signed)
Patient c/o elevated BP readings.  Stated that numbers have stayed elevated since last OV on 03/20/14 with Dr. Bronson Ing.  Yesterday 177/97  92 & today 165/97  91.  States that his readings have been around that range consistently.  Stress test on 03/12/14 showed no signs of coronary blockages.  Stated that he had CTA yesterday & did not feel good prior to that, but felt even worse afterwards.  Does c/o slight dizziness today, chest pain 3/10, & SOB with a lot of walking which he thinks is different for him.  States that Dr. 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  States that he is under a lot of stress currently.  Patient aware that Dr. Bronson Ing is out of office this afternoon & prefers to wait for reply from him tomorrow.  Advised patient to go to ED / 911 if symptoms worsen.  Patient verbalized understanding.

## 2014-04-04 MED ORDER — LABETALOL HCL 100 MG PO TABS: 100.0000 mg | ORAL_TABLET | Freq: Two times a day (BID) | ORAL | Status: DC

## 2014-04-04 NOTE — Telephone Encounter (Signed)
Patient notified.  Patient states he is taking all medications.  Will send new rx to [facility].  Advised to continue to monitor & call back with update in a few weeks or sooner if no improvement.  Patient verbalized understanding.

## 2014-04-04 NOTE — Telephone Encounter (Signed)
Is he taking amlodipine 10 mg daily, lisinopril 40 mg daily, and chlorthalidone 25 mg daily? If so and if BP still elevated, start labetalol 100 mg bid.

## 2014-04-04 NOTE — Addendum Note (Signed)
Addended by: Laurine Blazer on: 04/04/2014 06:13 PM   Modules accepted: Orders

## 2014-04-22 ENCOUNTER — Other Ambulatory Visit: Payer: Self-pay | Admitting: *Deleted

## 2014-04-22 MED ORDER — LABETALOL HCL 100 MG PO TABS: 100.0000 mg | ORAL_TABLET | Freq: Two times a day (BID) | ORAL | Status: DC

## 2014-04-22 MED ORDER — LISINOPRIL 40 MG PO TABS: 40.0000 mg | ORAL_TABLET | Freq: Every day | ORAL | Status: DC

## 2014-04-22 MED ORDER — AMLODIPINE BESYLATE 10 MG PO TABS: 10.0000 mg | ORAL_TABLET | Freq: Every day | ORAL | Status: DC

## 2014-04-22 MED ORDER — CHLORTHALIDONE 25 MG PO TABS: 12.5000 mg | ORAL_TABLET | Freq: Every day | ORAL | Status: DC

## 2014-05-02 ENCOUNTER — Encounter: Payer: Self-pay | Admitting: *Deleted

## 2014-05-02 LAB — BASIC METABOLIC PANEL
BUN: 20 mg/dL (ref 6–23)
CO2: 27 mEq/L (ref 19–32)
Calcium: 9.2 mg/dL (ref 8.4–10.5)
Chloride: 107 mEq/L (ref 96–112)
Creat: 1.19 mg/dL (ref 0.50–1.35)
Glucose, Bld: 134 mg/dL — ABNORMAL HIGH (ref 70–99)
Potassium: 4 mEq/L (ref 3.5–5.3)
SODIUM: 143 meq/L (ref 135–145)

## 2014-07-09 ENCOUNTER — Ambulatory Visit: Payer: Medicare Other | Admitting: Cardiovascular Disease

## 2014-07-14 ENCOUNTER — Encounter: Payer: Self-pay | Admitting: Cardiovascular Disease

## 2014-07-14 ENCOUNTER — Ambulatory Visit (INDEPENDENT_AMBULATORY_CARE_PROVIDER_SITE_OTHER): Payer: 59 | Admitting: Cardiovascular Disease

## 2014-07-14 VITALS — BP 121/80 | HR 67 | Ht 73.0 in | Wt 233.8 lb

## 2014-07-14 DIAGNOSIS — R682 Dry mouth, unspecified: Secondary | ICD-10-CM | POA: Diagnosis not present

## 2014-07-14 DIAGNOSIS — K5900 Constipation, unspecified: Secondary | ICD-10-CM

## 2014-07-14 DIAGNOSIS — N522 Drug-induced erectile dysfunction: Secondary | ICD-10-CM

## 2014-07-14 DIAGNOSIS — N182 Chronic kidney disease, stage 2 (mild): Secondary | ICD-10-CM

## 2014-07-14 DIAGNOSIS — I10 Essential (primary) hypertension: Secondary | ICD-10-CM

## 2014-07-14 DIAGNOSIS — I77810 Thoracic aortic ectasia: Secondary | ICD-10-CM | POA: Diagnosis not present

## 2014-07-14 NOTE — Patient Instructions (Signed)
   Stop Chlorthalidone Continue all other medications.   Please call office with update in 3 weeks regarding blood pressure control.   Your physician wants you to follow up in: 6 months.  You will receive a reminder letter in the mail one-two months in advance.  If you don't receive a letter, please call our office to schedule the follow up appointment

## 2014-07-14 NOTE — Progress Notes (Signed)
Patient ID: TORBEN SOLOWAY, male   DOB: 12-Jun-1960, 54 y.o.   MRN: [id number]      SUBJECTIVE: Mr. Borquez presents for routine follow up. He denies chest pain, orthopnea, paroxysmal nocturnal dyspnea, shortness of breath, palpitations, and leg swelling. His primary complaints relate to constipation, dry mouth in the morning, and a lack of sexual desire and erectile dysfunction. He questions whether the side effects are due to the medications he is taking for blood pressure control. He is taking laxatives and regularly eats a diet rich in fruits and vegetables and also eats high-fiber cereal. If he doesn't take a laxative he has rectal bleeding from constipation. He regularly checks his blood pressure and says it has remained normal for the past few months.   Review of Systems: As per "subjective", otherwise negative.  Allergies  Allergen Reactions  . Bee Venom Swelling    Current Outpatient Prescriptions  Medication Sig Dispense Refill  . amLODipine (NORVASC) 10 MG tablet Take 1 tablet (10 mg total) by mouth daily. 90 tablet 3  . aspirin EC 81 MG tablet Take 81 mg by mouth daily.    . chlorthalidone (HYGROTON) 25 MG tablet Take 0.5 tablets (12.5 mg total) by mouth daily. 45 tablet 3  . labetalol (NORMODYNE) 100 MG tablet Take 1 tablet (100 mg total) by mouth 2 (two) times daily. 180 tablet 3  . lisinopril (PRINIVIL,ZESTRIL) 40 MG tablet Take 1 tablet (40 mg total) by mouth daily. 90 tablet 3   No current facility-administered medications for this visit.    Past Medical History  Diagnosis Date  . Hypertension     Past Surgical History  Procedure Laterality Date  . Appendectomy    . Knee surgery    . Neck surgery      History   Social History  . Marital Status: Married    Spouse Name: N/A  . Number of Children: N/A  . Years of Education: N/A   Occupational History  . Not on file.   Social History Main Topics  . Smoking status: Never Smoker   . Smokeless tobacco: Never  Used  . Alcohol Use: No  . Drug Use: No  . Sexual Activity: Not on file   Other Topics Concern  . Not on file   Social History Narrative     Filed Vitals:   07/14/14 1613  BP: 121/80  Pulse: 67  Height: 6\' 1"  (1.854 m)  Weight: 233 lb 12.8 oz (106.051 kg)  SpO2: 97%    PHYSICAL EXAM General: NAD HEENT: Normal. Neck: No JVD, no thyromegaly. Lungs: Clear to auscultation bilaterally with normal respiratory effort. CV: Nondisplaced PMI.  Regular rate and rhythm, normal S1/S2, no S3/S4, no murmur. No pretibial or periankle edema.  No carotid bruit.  Normal pedal pulses.  Abdomen: Soft, nontender, no hepatosplenomegaly, no distention.  Neurologic: Alert and oriented x 3.  Psych: Normal affect. Skin: Normal. Musculoskeletal: Normal range of motion, no gross deformities. Extremities: No clubbing or cyanosis.   ECG: Most recent ECG reviewed.      ASSESSMENT AND PLAN: 1. Chest pain, PND, and shortness of breath: Resolved with BP control, and were likely related to malignant hypertension given normal myocardial perfusion.  2. Malignant hypertension: Well controlled on current therapy. However, due to constipation, dry mouth, and lack of sexual desire and erectile dysfunction, I will d/c chlorthalidone altogether. If BP remains controlled 3 weeks from now, I would consider reducing labetalol dosage. If this helps to improve his symptoms  but BP begins elevating, I may consider an alternative antihypertensive strategy such as hydralazine. 3. CKD stage 2: BUN 20/SCr 1.19 on 04/30/14. 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 Optimal BP control of prime importance.  4. Mild aortic root dilatation: Optimal BP control is of prime importance. CTA thoracic aorta in 11/15 demonstrated mild dilatation, maximum diameter 4.4 cm, with no ascending aortic pathology.  Dispo: f/u 6 months.  Kate Sable, M.D., F.A.C.C.

## 2014-07-18 ENCOUNTER — Telehealth: Payer: Self-pay | Admitting: *Deleted

## 2014-07-18 NOTE — Telephone Encounter (Signed)
Given relief of symptoms when stopping chlorthalidone but increasing BP, will switch to hydralazine as detailed in my note. Will start with 25 mg tid. Continue to monitor BP.

## 2014-07-18 NOTE — Telephone Encounter (Signed)
Recently saw Dr. Bronson Ing on 07/14/14.  Chlorthalidone was stopped due to multiple symptoms.  BP started going up - running from 140's to 160's.  Patient did not document his readings.  Stated that he restarted on his on & BP is slowly coming back down.  Stated that being off of med did help all the other symptoms reported in [place].  Advised patient to document his readings with complete BP & HR.  Will send to provider for advice.

## 2014-07-21 NOTE — Telephone Encounter (Signed)
Left message to return call 

## 2014-07-25 NOTE — Telephone Encounter (Signed)
Left message to return call 

## 2014-07-31 MED ORDER — HYDRALAZINE HCL 25 MG PO TABS: 25.0000 mg | ORAL_TABLET | Freq: Three times a day (TID) | ORAL | Status: DC

## 2014-07-31 NOTE — Telephone Encounter (Signed)
Patient notified.  Will send new med to [facility].

## 2014-10-29 ENCOUNTER — Other Ambulatory Visit: Payer: Self-pay | Admitting: *Deleted

## 2014-10-29 MED ORDER — HYDRALAZINE HCL 25 MG PO TABS: 25.0000 mg | ORAL_TABLET | Freq: Three times a day (TID) | ORAL | Status: DC

## 2015-01-14 ENCOUNTER — Encounter: Payer: Self-pay | Admitting: Cardiovascular Disease

## 2015-01-14 ENCOUNTER — Ambulatory Visit (INDEPENDENT_AMBULATORY_CARE_PROVIDER_SITE_OTHER): Payer: 59 | Admitting: Physician Assistant

## 2015-01-14 ENCOUNTER — Encounter: Payer: Self-pay | Admitting: Physician Assistant

## 2015-01-14 ENCOUNTER — Ambulatory Visit: Payer: 59 | Admitting: Physician Assistant

## 2015-01-14 ENCOUNTER — Ambulatory Visit (INDEPENDENT_AMBULATORY_CARE_PROVIDER_SITE_OTHER): Payer: 59 | Admitting: Cardiovascular Disease

## 2015-01-14 VITALS — BP 150/78 | HR 78 | Ht 72.0 in | Wt 238.0 lb

## 2015-01-14 VITALS — BP 130/64 | HR 76 | Temp 98.7°F | Resp 16 | Ht 73.0 in | Wt 238.0 lb

## 2015-01-14 DIAGNOSIS — I10 Essential (primary) hypertension: Secondary | ICD-10-CM

## 2015-01-14 DIAGNOSIS — N182 Chronic kidney disease, stage 2 (mild): Secondary | ICD-10-CM | POA: Diagnosis not present

## 2015-01-14 DIAGNOSIS — Z1211 Encounter for screening for malignant neoplasm of colon: Secondary | ICD-10-CM | POA: Diagnosis not present

## 2015-01-14 DIAGNOSIS — K5900 Constipation, unspecified: Secondary | ICD-10-CM | POA: Diagnosis not present

## 2015-01-14 DIAGNOSIS — I77810 Thoracic aortic ectasia: Secondary | ICD-10-CM | POA: Diagnosis not present

## 2015-01-14 DIAGNOSIS — K5909 Other constipation: Secondary | ICD-10-CM

## 2015-01-14 DIAGNOSIS — Z125 Encounter for screening for malignant neoplasm of prostate: Secondary | ICD-10-CM

## 2015-01-14 DIAGNOSIS — Z1212 Encounter for screening for malignant neoplasm of rectum: Secondary | ICD-10-CM

## 2015-01-14 DIAGNOSIS — R5383 Other fatigue: Secondary | ICD-10-CM | POA: Diagnosis not present

## 2015-01-14 LAB — CBC WITH DIFFERENTIAL/PLATELET
Basophils Absolute: 0.1 10*3/uL (ref 0.0–0.1)
Basophils Relative: 1 % (ref 0–1)
EOS ABS: 0.3 10*3/uL (ref 0.0–0.7)
EOS PCT: 4 % (ref 0–5)
HCT: 42.2 % (ref 39.0–52.0)
Hemoglobin: 14.2 g/dL (ref 13.0–17.0)
LYMPHS ABS: 1.7 10*3/uL (ref 0.7–4.0)
Lymphocytes Relative: 23 % (ref 12–46)
MCH: 28.6 pg (ref 26.0–34.0)
MCHC: 33.6 g/dL (ref 30.0–36.0)
MCV: 85.1 fL (ref 78.0–100.0)
MONO ABS: 0.7 10*3/uL (ref 0.1–1.0)
MONOS PCT: 9 % (ref 3–12)
MPV: 10.1 fL (ref 8.6–12.4)
NEUTROS PCT: 63 % (ref 43–77)
Neutro Abs: 4.7 10*3/uL (ref 1.7–7.7)
PLATELETS: 273 10*3/uL (ref 150–400)
RBC: 4.96 MIL/uL (ref 4.22–5.81)
RDW: 13.8 % (ref 11.5–15.5)
WBC: 7.4 10*3/uL (ref 4.0–10.5)

## 2015-01-14 MED ORDER — HYDRALAZINE HCL 50 MG PO TABS: 50.0000 mg | ORAL_TABLET | Freq: Three times a day (TID) | ORAL | Status: DC

## 2015-01-14 MED ORDER — LINACLOTIDE 145 MCG PO CAPS: 145.0000 ug | ORAL_CAPSULE | Freq: Every day | ORAL | Status: DC

## 2015-01-14 NOTE — Progress Notes (Signed)
Patient ID: George Pratt, male   DOB: 1960-06-07, 54 y.o.   MRN: [id number]      SUBJECTIVE: The patient presents for follow-up of malignant hypertension. Still having problems with constipation, dry mouth in the morning, and a lack of sexual desire and erectile dysfunction. Seeing PCP later today. Denies chest pain, shortness of breath, and leg swelling. Says morning BP is higher but normalizes after medications. Tolerating hydralazine.   Review of Systems: As per "subjective", otherwise negative.  Allergies  Allergen Reactions  . Bee Venom Swelling    Current Outpatient Prescriptions  Medication Sig Dispense Refill  . amLODipine (NORVASC) 10 MG tablet Take 1 tablet (10 mg total) by mouth daily. 90 tablet 3  . aspirin EC 81 MG tablet Take 81 mg by mouth daily.    . hydrALAZINE (APRESOLINE) 25 MG tablet Take 1 tablet (25 mg total) by mouth 3 (three) times daily. 180 tablet 3  . labetalol (NORMODYNE) 100 MG tablet Take 1 tablet (100 mg total) by mouth 2 (two) times daily. 180 tablet 3  . lisinopril (PRINIVIL,ZESTRIL) 40 MG tablet Take 1 tablet (40 mg total) by mouth daily. 90 tablet 3   No current facility-administered medications for this visit.    Past Medical History  Diagnosis Date  . Hypertension     Past Surgical History  Procedure Laterality Date  . Appendectomy    . Knee surgery    . Neck surgery      Social History   Social History  . Marital Status: Married    Spouse Name: N/A  . Number of Children: N/A  . Years of Education: N/A   Occupational History  . Not on file.   Social History Main Topics  . Smoking status: Never Smoker   . Smokeless tobacco: Never Used  . Alcohol Use: No  . Drug Use: No  . Sexual Activity: Not on file   Other Topics Concern  . Not on file   Social History Narrative     Filed Vitals:   01/14/15 0932  BP: 150/78  Pulse: 78  Height: 6' (1.829 m)  Weight: 238 lb (107.956 kg)  SpO2: 98%    PHYSICAL EXAM General:  NAD HEENT: Normal. Neck: No JVD, no thyromegaly. Lungs: Clear to auscultation bilaterally with normal respiratory effort. CV: Nondisplaced PMI.  Regular rate and rhythm, normal S1/S2, no S3/S4, no murmur. No pretibial or periankle edema.  No carotid bruit.    Abdomen: Soft, nontender, no distention.  Neurologic: Alert and oriented x 3.  Psych: Normal affect. Skin: Normal. Musculoskeletal: No gross deformities. Extremities: No clubbing or cyanosis.   ECG: Most recent ECG reviewed.      ASSESSMENT AND PLAN: 1. Chest pain, PND, and shortness of breath: Resolved with BP control, and were likely related to malignant hypertension given normal myocardial perfusion.   2. Malignant hypertension: Elevated on hydralazine 25 mg tid. Will increase to 50 mg tid. Continues to have constipation, dry mouth, and lack of sexual desire and erectile dysfunction. Possibly due to labetalol but unclear. Will first aim to control BP. Already on amlodipine 10 mg, lisinopril 40 mg, and labetalol 100 mg bid.  3. CKD stage 2: BUN 20/SCr 1.19 on 04/30/14. 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 Optimal BP control of prime importance.   4. Mild aortic root dilatation: Optimal BP control is of prime importance. CTA thoracic aorta in 03/2014 demonstrated mild dilatation, maximum diameter 4.4 cm, with no ascending aortic pathology. Will repeat CTA in 04/2015.  Dispo: f/u 1 year.   Kate Sable, M.D., F.A.C.C.

## 2015-01-14 NOTE — Patient Instructions (Signed)
   Increase Hydralazine to 50mg  three times per day - may take 2 of your 25mg  tabs 3 x per day till finish current supply.  New 90 day supply sent to [facility] today.  Continue all other medications.   CTA - due in December, will receive reminder in the mail Your physician wants you to follow up in:  1 year.  You will receive a reminder letter in the mail one-two months in advance.  If you don't receive a letter, please call our office to schedule the follow up appointment

## 2015-01-14 NOTE — Progress Notes (Signed)
Patient ID: George Pratt MRN: [id number], DOB: 1961-05-10, 54 y.o. Date of Encounter: @DATE @  Chief Complaint:  Chief Complaint  Patient presents with  . Establish Care    here to establish care.    HPI: 54 y.o. year old white male  presents to establish care.  He states that he has never had a primary care provider. Says that his father is a pediatrician who practiced in [place]. Says that he did not retire until age 64 and he is now 54. Says his father has just always treated any medical problems he has had, so that's why patient had not established primary care until now.  Says that he does see Cardiologist routinely.  Says that he sees an eye doctor once a year just for his eyeglasses. Says that he has no other problems with his eyes. No glaucoma etc.  Says that he sees no other medical providers.  Says that the only problem he has been having is constipation. Says he's been having issues with this for the past year. Uses over-the-counter laxitive as needed. Says uses this about once a week on average. However says that he has problems with constipation every day but he just waits until it gets really bad before he uses medication. No other complaints or concerns.   Past Medical History  Diagnosis Date  . Hypertension      Home Meds: Outpatient Prescriptions Prior to Visit  Medication Sig Dispense Refill  . amLODipine (NORVASC) 10 MG tablet Take 1 tablet (10 mg total) by mouth daily. 90 tablet 3  . aspirin EC 81 MG tablet Take 81 mg by mouth daily.    . hydrALAZINE (APRESOLINE) 50 MG tablet Take 1 tablet (50 mg total) by mouth 3 (three) times daily. 270 tablet 3  . labetalol (NORMODYNE) 100 MG tablet Take 1 tablet (100 mg total) by mouth 2 (two) times daily. 180 tablet 3  . lisinopril (PRINIVIL,ZESTRIL) 40 MG tablet Take 1 tablet (40 mg total) by mouth daily. 90 tablet 3   No facility-administered medications prior to visit.    Allergies:  Allergies  Allergen  Reactions  . Bee Venom Swelling    Social History   Social History  . Marital Status: Married    Spouse Name: N/A  . Number of Children: N/A  . Years of Education: N/A   Occupational History  . Not on file.   Social History Main Topics  . Smoking status: Never Smoker   . Smokeless tobacco: Never Used  . Alcohol Use: No  . Drug Use: No  . Sexual Activity: Not on file   Other Topics Concern  . Not on file   Social History Narrative   Married.    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    --Is available for students to call him, email him, etc   Says it is for a group of multiple colleges--in [facility], etc.    Family History  Problem Relation Age of Onset  . Hypertension Mother   . Heart failure Mother   . Hypertension Father   . Hypertension Brother   . Hypertension Brother   . Hypertension Brother      Review of Systems:  See HPI for pertinent ROS. All other ROS negative.    Physical Exam: Blood pressure 130/64, pulse 76, temperature 98.7 F (37.1 C), temperature source Oral, resp. rate 16, height 6\' 1"  (1.854 m), weight 238 lb (107.956 kg)., Body  mass index is 31.41 kg/(m^2). General: WNWD WM. Appears in no acute distress. Head: Normocephalic, atraumatic, eyes without discharge, sclera non-icteric, nares are without discharge. Bilateral auditory canals clear, TM's are without perforation, pearly grey and translucent with reflective cone of light bilaterally. Oral cavity moist, posterior pharynx without exudate, erythema, peritonsillar abscess, or post nasal drip.  Neck: Supple. No thyromegaly. No lymphadenopathy.No carotid bruit. Lungs: Clear bilaterally to auscultation without wheezes, rales, or rhonchi. Breathing is unlabored. Heart: RRR with S1 S2. No murmurs, rubs, or gallops. Abdomen: Soft, non-tender, non-distended with normoactive bowel sounds. No hepatomegaly. No rebound/guarding. No obvious abdominal  masses. Musculoskeletal:  Strength and tone normal for age. DRE: Deferred by patient Extremities/Skin: Warm and dry.  No edema. No rashes or suspicious lesions. Neuro: Alert and oriented X 3. Moves all extremities spontaneously. Gait is normal. CNII-XII grossly in tact. Psych:  Responds to questions appropriately with a normal affect.     ASSESSMENT AND PLAN:  54 y.o. year old male with  1. Chronic constipation I gave him 2 boxes of samples of Linzess, each of which has 4 capsules for total of 8. Also gave him savings card to pay no more than $30 for 90 day supply. Also try a 30 day supply first and then can switch to 90 day supply if he ends up using the medication regularly. - TSH - Linaclotide (LINZESS) 145 MCG CAPS capsule; Take 1 capsule (145 mcg total) by mouth daily.  Dispense: 30 capsule; Refill: 11  2. HTN (hypertension), malignant Blood pressure is now controlled. Sees cardiology routinely.  3. Aortic root dilatation Managed by Cardiology  4. Prostate cancer screening He is agreeable to check PSA today. - PSA  5. Screening for colorectal cancer He has had no screening in the past and is agreeable to be referred to GI for colonoscopy. - Ambulatory referral to Gastroenterology  6. Other fatigue He states he has had a lipid panel with cardiology when he first got established with them. I see no recent CBC so will go ahead and check CBC and TSH while drawing labs for PSA. - CBC with Differential/Platelet - TSH  7. Immunizations: Flu:  N/A--August Tetanus: UTD--dates last tetanus was about 4 years ago which was approx 2012 Pneumonia vaccine--- he has no indication to require this until age 54 Zostavax-----will discuss at age 54  Signed, [place], [place], [facility] 01/14/2015 2:45 PM

## 2015-01-15 LAB — TSH: TSH: 1.775 u[IU]/mL (ref 0.350–4.500)

## 2015-01-15 LAB — PSA: PSA: 0.85 ng/mL (ref <=4.00)

## 2015-01-20 ENCOUNTER — Telehealth: Payer: Self-pay | Admitting: Family Medicine

## 2015-01-20 NOTE — Telephone Encounter (Signed)
PA requested for Linzess thru CMM case [id number].  Have rec'd approval.  [id number] good thru 07/18/15   Faxed to pt pharmacy

## 2015-01-23 ENCOUNTER — Encounter (INDEPENDENT_AMBULATORY_CARE_PROVIDER_SITE_OTHER): Payer: Self-pay | Admitting: *Deleted

## 2015-01-28 ENCOUNTER — Encounter: Payer: Self-pay | Admitting: Family Medicine

## 2015-04-27 ENCOUNTER — Encounter: Payer: Self-pay | Admitting: Physician Assistant

## 2015-04-27 ENCOUNTER — Encounter: Payer: Self-pay | Admitting: Family Medicine

## 2015-04-27 ENCOUNTER — Ambulatory Visit (INDEPENDENT_AMBULATORY_CARE_PROVIDER_SITE_OTHER): Payer: 59 | Admitting: Physician Assistant

## 2015-04-27 VITALS — BP 114/70 | HR 76 | Temp 98.6°F | Resp 20 | Wt 238.0 lb

## 2015-04-27 DIAGNOSIS — J988 Other specified respiratory disorders: Secondary | ICD-10-CM

## 2015-04-27 DIAGNOSIS — J029 Acute pharyngitis, unspecified: Secondary | ICD-10-CM | POA: Diagnosis not present

## 2015-04-27 DIAGNOSIS — B9689 Other specified bacterial agents as the cause of diseases classified elsewhere: Secondary | ICD-10-CM

## 2015-04-27 LAB — RAPID STREP SCREEN (MED CTR MEBANE ONLY): Streptococcus, Group A Screen (Direct): NEGATIVE

## 2015-04-27 MED ORDER — AZITHROMYCIN 250 MG PO TABS: ORAL_TABLET | ORAL | Status: DC

## 2015-04-27 NOTE — Progress Notes (Signed)
    Patient ID: MARCANTHONY BODIN MRN: [id number], DOB: 30-Jun-1960, 54 y.o. Date of Encounter: 04/27/2015, 11:50 AM    Chief Complaint:  Chief Complaint  Patient presents with  . sick x 5 days    fever, congestion,sore throat     HPI: 54 y.o. year old white male states that symptoms started on Wednesday and then got a lot worse on Thursday 04/23/15. Says it is 90% chest versus head. Says that he has been coughing up green phlegm. Says his sore secondary to cough. Not blowing anything from his nose. No fevers or chills.     Home Meds:   Outpatient Prescriptions Prior to Visit  Medication Sig Dispense Refill  . amLODipine (NORVASC) 10 MG tablet Take 1 tablet (10 mg total) by mouth daily. 90 tablet 3  . aspirin EC 81 MG tablet Take 81 mg by mouth daily.    . hydrALAZINE (APRESOLINE) 50 MG tablet Take 1 tablet (50 mg total) by mouth 3 (three) times daily. 270 tablet 3  . labetalol (NORMODYNE) 100 MG tablet Take 1 tablet (100 mg total) by mouth 2 (two) times daily. 180 tablet 3  . Linaclotide (LINZESS) 145 MCG CAPS capsule Take 1 capsule (145 mcg total) by mouth daily. 30 capsule 11  . lisinopril (PRINIVIL,ZESTRIL) 40 MG tablet Take 1 tablet (40 mg total) by mouth daily. 90 tablet 3   No facility-administered medications prior to visit.    Allergies:  Allergies  Allergen Reactions  . Bee Venom Swelling      Review of Systems: See HPI for pertinent ROS. All other ROS negative.    Physical Exam: Blood pressure 114/70, pulse 76, temperature 98.6 F (37 C), temperature source Oral, resp. rate 20, weight 238 lb (107.956 kg)., Body mass index is 31.41 kg/(m^2). General:  WNWD WM. Appears in no acute distress. HEENT: Normocephalic, atraumatic, eyes without discharge, sclera non-icteric, nares are without discharge. Bilateral auditory canals clear, TM's are without perforation, pearly grey and translucent with reflective cone of light bilaterally. Oral cavity moist, posterior pharynx  without exudate, erythema, peritonsillar abscess.  Neck: Supple. No thyromegaly. No lymphadenopathy. Lungs: Clear bilaterally to auscultation without wheezes, rales, or rhonchi. Breathing is unlabored. Heart: Regular rhythm. No murmurs, rubs, or gallops. Msk:  Strength and tone normal for age. Extremities/Skin: Warm and dry. Neuro: Alert and oriented X 3. Moves all extremities spontaneously. Gait is normal. CNII-XII grossly in tact. Psych:  Responds to questions appropriately with a normal affect.   Results for orders placed or performed in visit on 04/27/15  Rapid strep screen (not at [facility])  Result Value Ref Range   Source THROAT    Streptococcus, Group A Screen (Direct) NEG NEGATIVE     ASSESSMENT AND PLAN:  54 y.o. year old male with  1. Bacterial respiratory infection Start antibiotics immediately, take as directed. Continue Robitussin-DM expectorant. Note given for out of work today and tomorrow. Follow-up if symptoms do not resolve within 1 week after completion of antibiotic. - azithromycin (ZITHROMAX) 250 MG tablet; Day 1: Take 2 daily. Days 2-5: Take 1 daily.  Dispense: 6 tablet; Refill: 0  2. Sorethroat - Rapid strep screen (not at [facility])   Signed, [facility] [place], [place], [facility] 04/27/2015 11:50 AM

## 2015-04-29 ENCOUNTER — Encounter: Payer: Self-pay | Admitting: *Deleted

## 2015-05-06 ENCOUNTER — Telehealth: Payer: Self-pay | Admitting: Cardiovascular Disease

## 2015-05-06 NOTE — Telephone Encounter (Signed)
Please call patient

## 2015-05-06 NOTE — Telephone Encounter (Signed)
Patient said that CKD stage 2 is listed as a diagnosis in his office note dated 07/14/14. Patient said he does not have this diagnosis and wants it removed from his chart. Please advise.

## 2015-05-06 NOTE — Telephone Encounter (Signed)
GFR was 68 ml/min on 05/01/14, consistent with CKD stage 2. I would need to look at a more recent creatinine to see if this continues to hold true.

## 2015-05-06 NOTE — Telephone Encounter (Signed)
Mr. Dieleman returned call.  He requested to call him at [phone]

## 2015-05-07 NOTE — Telephone Encounter (Signed)
Patient informed and verbalized understanding of plan. 

## 2015-05-13 ENCOUNTER — Other Ambulatory Visit: Payer: Self-pay | Admitting: Cardiovascular Disease

## 2015-05-13 ENCOUNTER — Other Ambulatory Visit (INDEPENDENT_AMBULATORY_CARE_PROVIDER_SITE_OTHER): Payer: Self-pay | Admitting: *Deleted

## 2015-05-13 DIAGNOSIS — Z1211 Encounter for screening for malignant neoplasm of colon: Secondary | ICD-10-CM

## 2015-06-25 ENCOUNTER — Telehealth (INDEPENDENT_AMBULATORY_CARE_PROVIDER_SITE_OTHER): Payer: Self-pay | Admitting: *Deleted

## 2015-06-25 NOTE — Telephone Encounter (Signed)
Referring MD/PCP: mary dixon -- [facility]   Procedure: tcs  Reason/Indication:  screening  Has patient had this procedure before?  no  If so, when, by whom and where?    Is there a family history of colon cancer?  no  Who?  What age when diagnosed?    Is patient diabetic?   no      Does patient have prosthetic heart valve or mechanical valve?  no  Do you have a pacemaker?  no  Has patient ever had endocarditis? no  Has patient had joint replacement within last 12 months?  no  Does patient tend to be constipated or take laxatives? no  Does patient have a history of alcohol/drug use?  no  Is patient on Coumadin, Plavix and/or Aspirin? yes  Medications: norvasc 10 mg daily, asa 81 mg daily, hydralazine 50 mg tid, labetalol 100 mg bid, linzess 145 mg prn, lisinopril 40 mg daily  Allergies: bee venom  Medication Adjustment: asa 2 days  Procedure date & time: 07/23/15 at 730

## 2015-06-29 NOTE — Telephone Encounter (Signed)
agree

## 2015-07-23 ENCOUNTER — Other Ambulatory Visit (INDEPENDENT_AMBULATORY_CARE_PROVIDER_SITE_OTHER): Payer: Self-pay | Admitting: *Deleted

## 2015-07-23 ENCOUNTER — Encounter (HOSPITAL_COMMUNITY): Payer: Self-pay | Admitting: *Deleted

## 2015-07-23 ENCOUNTER — Ambulatory Visit (HOSPITAL_COMMUNITY)
Admission: RE | Admit: 2015-07-23 | Discharge: 2015-07-23 | Disposition: A | Discharge status: Home / Self Care | Payer: 59 | Source: Ambulatory Visit | Attending: Internal Medicine | Admitting: Internal Medicine

## 2015-07-23 ENCOUNTER — Encounter (HOSPITAL_COMMUNITY)
Admission: RE | Disposition: A | Discharge status: Home / Self Care | Payer: Self-pay | Source: Ambulatory Visit | Attending: Internal Medicine

## 2015-07-23 DIAGNOSIS — Z1211 Encounter for screening for malignant neoplasm of colon: Secondary | ICD-10-CM | POA: Insufficient documentation

## 2015-07-23 DIAGNOSIS — Z538 Procedure and treatment not carried out for other reasons: Secondary | ICD-10-CM | POA: Insufficient documentation

## 2015-07-23 SURGERY — CANCELLED PROCEDURE

## 2015-07-23 NOTE — OR Nursing (Signed)
Patient arrived for a colonoscopy. Patient stated he did not drink a prep and never received any instructions in the mail. Dr. Laural Golden notified. Procedure cancelled for today. Left message for Lacretia Nicks at Dr. Olevia Perches office to call back. Will get patient rescheduled.

## 2015-08-28 ENCOUNTER — Other Ambulatory Visit (INDEPENDENT_AMBULATORY_CARE_PROVIDER_SITE_OTHER): Payer: Self-pay | Admitting: *Deleted

## 2015-08-28 ENCOUNTER — Encounter (INDEPENDENT_AMBULATORY_CARE_PROVIDER_SITE_OTHER): Payer: Self-pay | Admitting: *Deleted

## 2015-08-28 NOTE — Telephone Encounter (Signed)
Patient needs trilyte 

## 2015-08-31 MED ORDER — PEG 3350-KCL-NA BICARB-NACL 420 G PO SOLR: 4000.0000 mL | Freq: Once | ORAL | Status: DC

## 2015-09-03 ENCOUNTER — Telehealth (INDEPENDENT_AMBULATORY_CARE_PROVIDER_SITE_OTHER): Payer: Self-pay | Admitting: *Deleted

## 2015-09-03 NOTE — Telephone Encounter (Signed)
Referring MD/PCP: mary dixon, [facility]   Procedure: tcs  Reason/Indication:  screening  Has patient had this procedure before?  no  If so, when, by whom and where?    Is there a family history of colon cancer?  no  Who?  What age when diagnosed?    Is patient diabetic?   no      Does patient have prosthetic heart valve or mechanical valve?  no  Do you have a pacemaker?  no  Has patient ever had endocarditis? no  Has patient had joint replacement within last 12 months?  no  Does patient tend to be constipated or take laxatives? no  Does patient have a history of alcohol/drug use?  no  Is patient on Coumadin, Plavix and/or Aspirin? yes  Medications: norvasc 10 mg daily, asa 81 mg daily, hydralazine 50 mg tid, labetolol 100 mg bid, linzess 145 mg prn, lisinopril 40 mg daily  Allergies: bee venom  Medication Adjustment: asa 2 days  Procedure date & time: 10/01/15 at 1030

## 2015-09-03 NOTE — Telephone Encounter (Signed)
agree

## 2015-10-01 ENCOUNTER — Encounter (HOSPITAL_COMMUNITY): Payer: Self-pay

## 2015-10-01 ENCOUNTER — Encounter (HOSPITAL_COMMUNITY)
Admission: RE | Disposition: A | Discharge status: Home / Self Care | Payer: Self-pay | Source: Ambulatory Visit | Attending: Internal Medicine

## 2015-10-01 ENCOUNTER — Ambulatory Visit (HOSPITAL_COMMUNITY)
Admission: RE | Admit: 2015-10-01 | Discharge: 2015-10-01 | Disposition: A | Discharge status: Home / Self Care | Payer: 59 | Source: Ambulatory Visit | Attending: Internal Medicine | Admitting: Internal Medicine

## 2015-10-01 DIAGNOSIS — K5730 Diverticulosis of large intestine without perforation or abscess without bleeding: Secondary | ICD-10-CM | POA: Insufficient documentation

## 2015-10-01 DIAGNOSIS — I10 Essential (primary) hypertension: Secondary | ICD-10-CM | POA: Diagnosis not present

## 2015-10-01 DIAGNOSIS — D122 Benign neoplasm of ascending colon: Secondary | ICD-10-CM | POA: Diagnosis not present

## 2015-10-01 DIAGNOSIS — Z7982 Long term (current) use of aspirin: Secondary | ICD-10-CM | POA: Insufficient documentation

## 2015-10-01 DIAGNOSIS — Z1211 Encounter for screening for malignant neoplasm of colon: Secondary | ICD-10-CM | POA: Diagnosis not present

## 2015-10-01 DIAGNOSIS — K644 Residual hemorrhoidal skin tags: Secondary | ICD-10-CM | POA: Diagnosis not present

## 2015-10-01 DIAGNOSIS — Z79899 Other long term (current) drug therapy: Secondary | ICD-10-CM | POA: Diagnosis not present

## 2015-10-01 HISTORY — PX: COLONOSCOPY: SHX5424

## 2015-10-01 SURGERY — COLONOSCOPY
Anesthesia: Moderate Sedation

## 2015-10-01 MED ORDER — MIDAZOLAM HCL 5 MG/5ML IJ SOLN
INTRAMUSCULAR | Status: AC
  Filled 2015-10-01: qty 10

## 2015-10-01 MED ORDER — SODIUM CHLORIDE 0.9 % IV SOLN
INTRAVENOUS | Status: DC
  Administered 2015-10-01: 20 mL/h via INTRAVENOUS

## 2015-10-01 MED ORDER — MEPERIDINE HCL 50 MG/ML IJ SOLN
INTRAMUSCULAR | Status: AC
  Filled 2015-10-01: qty 1

## 2015-10-01 MED ORDER — MIDAZOLAM HCL 5 MG/5ML IJ SOLN
INTRAMUSCULAR | Status: DC | PRN
  Administered 2015-10-01 (×5): 2 mg via INTRAVENOUS

## 2015-10-01 MED ORDER — MEPERIDINE HCL 50 MG/ML IJ SOLN
INTRAMUSCULAR | Status: DC | PRN
  Administered 2015-10-01 (×4): 25 mg via INTRAVENOUS

## 2015-10-01 MED ORDER — STERILE WATER FOR IRRIGATION IR SOLN
Status: DC | PRN
  Administered 2015-10-01: 10:00:00

## 2015-10-01 NOTE — Op Note (Signed)
[facility] Patient Name: George Pratt Procedure Date: 10/01/2015 10:22 AM MRN: [id number] Date of Birth: 09/16/1960 Attending MD: Hildred Laser , MD CSN: [id number] Age: 55 Admit Type: Outpatient Procedure:                Colonoscopy Indications:              Screening for colorectal malignant neoplasm Providers:                Hildred Laser, MD, Janeece Riggers, RN, Rosina Lowenstein, RN Referring MD:             Ms. Lonie Peak. Dixon, PAC Medicines:                Meperidine 100 mg IV, Midazolam 10 mg IV Complications:            No immediate complications. Estimated Blood Loss:     Estimated blood loss was minimal. Procedure:                Pre-Anesthesia Assessment:                           - Prior to the procedure, a History and Physical                            was performed, and patient medications and                            allergies were reviewed. The patient's tolerance of                            previous anesthesia was also reviewed. The risks                            and benefits of the procedure and the sedation                            options and risks were discussed with the patient.                            All questions were answered, and informed consent                            was obtained. Prior Anticoagulants: The patient                            last took aspirin 2 days prior to the procedure.                            ASA Grade Assessment: I - A normal, healthy                            patient. After reviewing the risks and benefits,                            the patient was deemed in satisfactory condition to  undergo the procedure.                           After obtaining informed consent, the colonoscope                            was passed under direct vision. Throughout the                            procedure, the patient's blood pressure, pulse, and                            oxygen saturations were monitored  continuously. The                            EC-[id number]) scope was introduced through                            the anus and advanced to the the cecum, identified                            by appendiceal orifice and ileocecal valve. The                            colonoscopy was performed without difficulty. The                            patient tolerated the procedure well. The quality                            of the bowel preparation was adequate. The                            ileocecal valve, appendiceal orifice, and rectum                            were photographed. Scope In: 10:36:02 AM Scope Out: 11:05:52 AM Total Procedure Duration: 0 hours 29 minutes 50 seconds  Findings:      A 6 mm polyp was found in the ascending colon. The polyp was sessile.       The polyp was removed with a cold snare. Resection and retrieval were       complete. To stop active bleeding, one hemostatic clip was successfully       placed (MR conditional). There was no bleeding at the end of the       procedure.      A few small-mouthed diverticula were found in the sigmoid colon.      External hemorrhoids were found during retroflexion. The hemorrhoids       were small. Impression:               - One 6 mm polyp in the ascending colon, removed                            with a cold snare. Resected and retrieved. Clip (MR  conditional) was placed.                           - Diverticulosis in the sigmoid colon.                           - External hemorrhoids. Moderate Sedation:      Moderate (conscious) sedation was administered by the endoscopy nurse       and supervised by the endoscopist. The following parameters were       monitored: oxygen saturation, heart rate, blood pressure, CO2       capnography and response to care. Total physician intraservice time was       32 minutes. Recommendation:           - Patient has a contact number available for                             emergencies. The signs and symptoms of potential                            delayed complications were discussed with the                            patient. Return to normal activities tomorrow.                            Written discharge instructions were provided to the                            patient.                           - High fiber diet today.                           - Continue present medications.                           - No aspirin, ibuprofen, naproxen, or other                            non-steroidal anti-inflammatory drugs for 7 days                            after polyp removal.                           - Await pathology results.                           - Repeat colonoscopy for surveillance based on                            pathology results. Procedure Code(s):        --- Professional ---  [id number], Colonoscopy, flexible; with removal of                            tumor(s), polyp(s), or other lesion(s) by snare                            technique                           99152, Moderate sedation services provided by the                            same physician or other qualified health care                            professional performing the diagnostic or                            therapeutic service that the sedation supports,                            requiring the presence of an independent trained                            observer to assist in the monitoring of the                            patient's level of consciousness and physiological                            status; initial 15 minutes of intraservice time,                            patient age 22 years or older                           [phone], Moderate sedation services; each additional                            15 minutes intraservice time Diagnosis Code(s):        --- Professional ---                           Z12.11, Encounter for screening  for malignant                            neoplasm of colon                           D12.2, Benign neoplasm of ascending colon                           K64.4, Residual hemorrhoidal skin tags                           K57.30, Diverticulosis of large  intestine without                            perforation or abscess without bleeding CPT copyright 2016 American Medical Association. All rights reserved. The codes documented in this report are preliminary and upon coder review may  be revised to meet current compliance requirements. Hildred Laser, MD Hildred Laser, MD 10/01/2015 11:10:27 AM This report has been signed electronically. Number of Addenda: 0

## 2015-10-01 NOTE — Discharge Instructions (Signed)
No aspirin or NSAIDs for 1 week. Resume usual medications and high fiber diet. No driving for 24 hours. Physician will call with biopsy results.   High-Fiber Diet Fiber, also called dietary fiber, is a type of carbohydrate found in fruits, vegetables, whole grains, and beans. A high-fiber diet can have many health benefits. Your health care provider may recommend a high-fiber diet to help:  Prevent constipation. Fiber can make your bowel movements more regular.  Lower your cholesterol.  Relieve hemorrhoids, uncomplicated diverticulosis, or irritable bowel syndrome.  Prevent overeating as part of a weight-loss plan.  Prevent heart disease, type 2 diabetes, and certain cancers. WHAT IS MY PLAN? The recommended daily intake of fiber includes:  38 grams for men under age 50.  53 grams for men over age 56.  33 grams for women under age 55.  51 grams for women over age 33. You can get the recommended daily intake of dietary fiber by eating a variety of fruits, vegetables, grains, and beans. Your health care provider may also recommend a fiber supplement if it is not possible to get enough fiber through your diet. WHAT DO I NEED TO KNOW ABOUT A HIGH-FIBER DIET?  Fiber supplements have not been widely studied for their effectiveness, so it is better to get fiber through food sources.  Always check the fiber content on thenutrition facts label of any prepackaged food. Look for foods that contain at least 5 grams of fiber per serving.  Ask your dietitian if you have questions about specific foods that are related to your condition, especially if those foods are not listed in the following section.  Increase your daily fiber consumption gradually. Increasing your intake of dietary fiber too quickly may cause bloating, cramping, or gas.  Drink plenty of water. Water helps you to digest fiber. WHAT FOODS CAN I EAT? Grains Whole-grain breads. Multigrain cereal. Oats and oatmeal. Brown  rice. Barley. Bulgur wheat. Seffner. Bran muffins. Popcorn. Rye wafer crackers. Vegetables Sweet potatoes. Spinach. Kale. Artichokes. Cabbage. Broccoli. Green peas. Carrots. Squash. Fruits Berries. Pears. Apples. Oranges. Avocados. Prunes and raisins. Dried figs. Meats and Other Protein Sources Navy, kidney, pinto, and soy beans. Split peas. Lentils. Nuts and seeds. Dairy Fiber-fortified yogurt. Beverages Fiber-fortified soy milk. Fiber-fortified orange juice. Other Fiber bars. The items listed above may not be a complete list of recommended foods or beverages. Contact your dietitian for more options. WHAT FOODS ARE NOT RECOMMENDED? Grains White bread. Pasta made with refined flour. White rice. Vegetables Fried potatoes. Canned vegetables. Well-cooked vegetables.  Fruits Fruit juice. Cooked, strained fruit. Meats and Other Protein Sources Fatty cuts of meat. Fried Sales executive or fried fish. Dairy Milk. Yogurt. Cream cheese. Sour cream. Beverages Soft drinks. Other Cakes and pastries. Butter and oils. The items listed above may not be a complete list of foods and beverages to avoid. Contact your dietitian for more information. WHAT ARE SOME TIPS FOR INCLUDING HIGH-FIBER FOODS IN MY DIET?  Eat a wide variety of high-fiber foods.  Make sure that half of all grains consumed each day are whole grains.  Replace breads and cereals made from refined flour or white flour with whole-grain breads and cereals.  Replace white rice with brown rice, bulgur wheat, or millet.  Start the day with a breakfast that is high in fiber, such as a cereal that contains at least 5 grams of fiber per serving.  Use beans in place of meat in soups, salads, or pasta.  Eat high-fiber snacks, such as berries,  raw vegetables, nuts, or popcorn.   This information is not intended to replace advice given to you by your health care provider. Make sure you discuss any questions you have with your health care  provider.   Document Released: 05/16/2005 Document Revised: 06/06/2014 Document Reviewed: 10/29/2013 Elsevier Interactive Patient Education 2016 Elsevier Inc. Colon Polyps Polyps are lumps of extra tissue growing inside the body. Polyps can grow in the large intestine (colon). Most colon polyps are noncancerous (benign). However, some colon polyps can become cancerous over time. Polyps that are larger than a pea may be harmful. To be safe, caregivers remove and test all polyps. CAUSES  Polyps form when mutations in the genes cause your cells to grow and divide even though no more tissue is needed. RISK FACTORS There are a number of risk factors that can increase your chances of getting colon polyps. They include:  Being older than 50 years.  Family history of colon polyps or colon cancer.  Long-term colon diseases, such as colitis or Crohn disease.  Being overweight.  Smoking.  Being inactive.  Drinking too much alcohol. SYMPTOMS  Most small polyps do not cause symptoms. If symptoms are present, they may include:  Blood in the stool. The stool may look dark red or black.  Constipation or diarrhea that lasts longer than 1 week. DIAGNOSIS People often do not know they have polyps until their caregiver finds them during a regular checkup. Your caregiver can use 4 tests to check for polyps:  Digital rectal exam. The caregiver wears gloves and feels inside the rectum. This test would find polyps only in the rectum.  Barium enema. The caregiver puts a liquid called barium into your rectum before taking X-rays of your colon. Barium makes your colon look white. Polyps are dark, so they are easy to see in the X-ray pictures.  Sigmoidoscopy. A thin, flexible tube (sigmoidoscope) is placed into your rectum. The sigmoidoscope has a light and tiny camera in it. The caregiver uses the sigmoidoscope to look at the last third of your colon.  Colonoscopy. This test is like sigmoidoscopy, but  the caregiver looks at the entire colon. This is the most common method for finding and removing polyps. TREATMENT  Any polyps will be removed during a sigmoidoscopy or colonoscopy. The polyps are then tested for cancer. PREVENTION  To help lower your risk of getting more colon polyps:  Eat plenty of fruits and vegetables. Avoid eating fatty foods.  Do not smoke.  Avoid drinking alcohol.  Exercise every day.  Lose weight if recommended by your caregiver.  Eat plenty of calcium and folate. Foods that are rich in calcium include milk, cheese, and broccoli. Foods that are rich in folate include chickpeas, kidney beans, and spinach. HOME CARE INSTRUCTIONS Keep all follow-up appointments as directed by your caregiver. You may need periodic exams to check for polyps. SEEK MEDICAL CARE IF: You notice bleeding during a bowel movement.   This information is not intended to replace advice given to you by your health care provider. Make sure you discuss any questions you have with your health care provider.   Document Released: 02/10/2004 Document Revised: 06/06/2014 Document Reviewed: 07/26/2011 Elsevier Interactive Patient Education 2016 [employer]. Colonoscopy, Care After These instructions give you information on caring for yourself after your procedure. Your doctor may also give you more specific instructions. Call your doctor if you have any problems or questions after your procedure. HOME CARE  Do not drive for 24 hours.  Do not sign important papers or use machinery for 24 hours.  You may shower.  You may go back to your usual activities, but go slower for the first 24 hours.  Take rest breaks often during the first 24 hours.  Walk around or use warm packs on your belly (abdomen) if you have belly cramping or gas.  Drink enough fluids to keep your pee (urine) clear or pale yellow.  Resume your normal diet. Avoid heavy or fried foods.  Avoid drinking alcohol for 24 hours  or as told by your doctor.  Only take medicines as told by your doctor. If a tissue sample (biopsy) was taken during the procedure:   Do not take aspirin or blood thinners for 7 days, or as told by your doctor.  Do not drink alcohol for 7 days, or as told by your doctor.  Eat soft foods for the first 24 hours. GET HELP IF: You still have a small amount of blood in your poop (stool) 2-3 days after the procedure. GET HELP RIGHT AWAY IF:  You have more than a small amount of blood in your poop.  You see clumps of tissue (blood clots) in your poop.  Your belly is puffy (swollen).  You feel sick to your stomach (nauseous) or throw up (vomit).  You have a fever.  You have belly pain that gets worse and medicine does not help. MAKE SURE YOU:  Understand these instructions.  Will watch your condition.  Will get help right away if you are not doing well or get worse.   This information is not intended to replace advice given to you by your health care provider. Make sure you discuss any questions you have with your health care provider.   Document Released: 06/18/2010 Document Revised: 05/21/2013 Document Reviewed: 01/21/2013 Elsevier Interactive Patient Education [employer].

## 2015-10-01 NOTE — H&P (Signed)
CID BILLICK is an 55 y.o. male.   Chief Complaint: Patient is here for colonoscopy. HPI: She is 55 year old Caucasian male who is here for screening colonoscopy. He denies abdominal pain change in bowel habits or rectal bleeding. This is patient's first exam. Family history is negative for CRC.  Past Medical History  Diagnosis Date  . Hypertension     Past Surgical History  Procedure Laterality Date  . Knee surgery    . Neck surgery  2000  . Appendectomy  2006    Family History  Problem Relation Age of Onset  . Hypertension Mother   . Heart failure Mother   . Heart disease Mother   . Hypertension Father   . Diabetes Father   . Vision loss Father   . Hypertension Brother   . Hypertension Brother   . Hypertension Brother    Social History:  reports that he has never smoked. He has never used smokeless tobacco. He reports that he does not drink alcohol or use illicit drugs.  Allergies:  Allergies  Allergen Reactions  . Bee Venom Swelling    Medications Prior to Admission  Medication Sig Dispense Refill  . amLODipine (NORVASC) 10 MG tablet Take 1 tablet by mouth  daily 90 tablet 3  . aspirin EC 81 MG tablet Take 81 mg by mouth daily.    . hydrALAZINE (APRESOLINE) 50 MG tablet Take 1 tablet (50 mg total) by mouth 3 (three) times daily. 270 tablet 3  . labetalol (NORMODYNE) 100 MG tablet Take 1 tablet by mouth two  times daily 180 tablet 3  . lisinopril (PRINIVIL,ZESTRIL) 40 MG tablet Take 1 tablet by mouth  daily 90 tablet 3  . polyethylene glycol-electrolytes (NULYTELY/GOLYTELY) 420 g solution Take 4,000 mLs by mouth once. 4000 mL 0    No results found for this or any previous visit (from the past 48 hour(s)). No results found.  ROS  Blood pressure 166/94, pulse 107, temperature 98.5 F (36.9 C), temperature source Oral, resp. rate 17, height 6\' 1"  (1.854 m), weight 225 lb (102.059 kg), SpO2 99 %. Physical Exam  Constitutional: He appears well-developed and  well-nourished.  HENT:  Mouth/Throat: Oropharynx is clear and moist.  Eyes: Conjunctivae are normal. No scleral icterus.  Neck: No thyromegaly present.  Cardiovascular: Normal rate, regular rhythm and normal heart sounds.   No murmur heard. Respiratory: Effort normal and breath sounds normal.  GI:  Abdomen is full but soft and nontender without organomegaly or masses. Small umblical hernia noted.  Musculoskeletal: He exhibits no edema.  Lymphadenopathy:    He has no cervical adenopathy.  Neurological: He is alert.  Skin: Skin is warm and dry.     Assessment/Plan Average risk screening colonoscopy.  Rogene Houston, MD 10/01/2015, 10:24 AM

## 2015-10-05 ENCOUNTER — Encounter (HOSPITAL_COMMUNITY): Payer: Self-pay | Admitting: Internal Medicine

## 2016-01-06 ENCOUNTER — Other Ambulatory Visit: Payer: Self-pay | Admitting: Cardiovascular Disease

## 2016-01-06 ENCOUNTER — Ambulatory Visit (INDEPENDENT_AMBULATORY_CARE_PROVIDER_SITE_OTHER): Payer: 59 | Admitting: Cardiovascular Disease

## 2016-01-06 ENCOUNTER — Encounter: Payer: Self-pay | Admitting: Cardiovascular Disease

## 2016-01-06 VITALS — BP 136/80 | HR 76 | Ht 73.0 in | Wt 231.0 lb

## 2016-01-06 DIAGNOSIS — I10 Essential (primary) hypertension: Secondary | ICD-10-CM | POA: Diagnosis not present

## 2016-01-06 DIAGNOSIS — I77810 Thoracic aortic ectasia: Secondary | ICD-10-CM | POA: Diagnosis not present

## 2016-01-06 DIAGNOSIS — I712 Thoracic aortic aneurysm, without rupture: Secondary | ICD-10-CM

## 2016-01-06 DIAGNOSIS — Z01812 Encounter for preprocedural laboratory examination: Secondary | ICD-10-CM | POA: Diagnosis not present

## 2016-01-06 DIAGNOSIS — I7121 Aneurysm of the ascending aorta, without rupture: Secondary | ICD-10-CM

## 2016-01-06 NOTE — Patient Instructions (Addendum)
Medication Instructions:   Continue all current medications.  Labwork:  BMET - order given today.  Testing/Procedures:  CT angiography of chest with contrast.   Office will contact with results via phone or letter.    Follow-Up:  Your physician wants you to follow up in:  1 year.  You will receive a reminder letter in the mail one-two months in advance.  If you don't receive a letter, please call our office to schedule the follow up appointment   Any Other Special Instructions Will Be Listed Below (If Applicable).  If you need a refill on your cardiac medications before your next appointment, please call your pharmacy.

## 2016-01-06 NOTE — Addendum Note (Signed)
Addended by: Laurine Blazer on: 01/06/2016 08:50 AM   Modules accepted: Orders

## 2016-01-06 NOTE — Progress Notes (Signed)
      SUBJECTIVE: The patient presents for follow-up of malignant hypertension. The patient denies any symptoms of chest pain, palpitations, shortness of breath, lightheadedness, dizziness, leg swelling, orthopnea, PND, and syncope. Checks BP twice daily and says it stays within normal range.  Review of Systems: As per "subjective", otherwise negative.  Allergies  Allergen Reactions  . Bee Venom Swelling    Current Outpatient Prescriptions  Medication Sig Dispense Refill  . amLODipine (NORVASC) 10 MG tablet Take 1 tablet by mouth  daily 90 tablet 3  . aspirin EC 81 MG tablet Take 1 tablet (81 mg total) by mouth daily.    . hydrALAZINE (APRESOLINE) 50 MG tablet Take 1 tablet (50 mg total) by mouth 3 (three) times daily. 270 tablet 3  . labetalol (NORMODYNE) 100 MG tablet Take 1 tablet by mouth two  times daily 180 tablet 3  . lisinopril (PRINIVIL,ZESTRIL) 40 MG tablet Take 1 tablet by mouth  daily 90 tablet 3   No current facility-administered medications for this visit.     Past Medical History:  Diagnosis Date  . Hypertension     Past Surgical History:  Procedure Laterality Date  . APPENDECTOMY  2006  . COLONOSCOPY N/A 10/01/2015   Procedure: COLONOSCOPY;  Surgeon: Rogene Houston, MD;  Location: [facility];  Service: Endoscopy;  Laterality: N/A;  1030  . KNEE SURGERY    . NECK SURGERY  2000    Social History   Social History  . Marital status: Married    Spouse name: N/A  . Number of children: N/A  . Years of education: N/A   Occupational History  . Not on file.   Social History Main Topics  . Smoking status: Never Smoker  . Smokeless tobacco: Never Used  . Alcohol use No  . Drug use: No  . Sexual activity: Yes   Other Topics Concern  . Not on file   Social History Narrative   Married.    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    --Is available for students to call him, email him, etc   Says it is for a group of  multiple colleges--in [facility], etc.     Vitals:   01/06/16 0826  BP: 136/80  Pulse: 76  SpO2: 98%  Weight: 231 lb (104.8 kg)  Height: 6\' 1"  (1.854 m)    PHYSICAL EXAM General: NAD HEENT: Normal. Neck: No JVD, no thyromegaly. Lungs: Clear to auscultation bilaterally with normal respiratory effort. CV: Nondisplaced PMI.  Regular rate and rhythm, normal S1/S2, no S3/S4, no murmur. No pretibial or periankle edema.  No carotid bruit.   Abdomen: Soft, nontender, no distention.  Neurologic: Alert and oriented.  Psych: Normal affect. Skin: Normal. Musculoskeletal: No gross deformities.    ECG: Most recent ECG reviewed.      ASSESSMENT AND PLAN: 1. Malignant hypertension: Controlled. No changes.  2. Mild aortic root dilatation: Optimal BP control is of prime importance. CTA thoracic aorta in 03/2014 demonstrated mild dilatation, maximum diameter 4.4 cm, with no ascending aortic pathology. Will repeat CTA.  Dispo: f/u 1 year.   Kate Sable, M.D., F.A.C.C.

## 2016-01-07 LAB — BASIC METABOLIC PANEL
BUN: 15 mg/dL (ref 7–25)
CALCIUM: 9.4 mg/dL (ref 8.6–10.3)
CO2: 26 mmol/L (ref 20–31)
Chloride: 106 mmol/L (ref 98–110)
Creat: 1.4 mg/dL — ABNORMAL HIGH (ref 0.70–1.33)
GLUCOSE: 109 mg/dL — AB (ref 65–99)
Potassium: 4.3 mmol/L (ref 3.5–5.3)
Sodium: 143 mmol/L (ref 135–146)

## 2016-01-12 ENCOUNTER — Encounter: Payer: Self-pay | Admitting: Cardiovascular Disease

## 2016-01-12 ENCOUNTER — Telehealth: Payer: Self-pay | Admitting: *Deleted

## 2016-01-12 NOTE — Telephone Encounter (Signed)
Notes Recorded by Laurine Blazer, LPN on QA348G at QA348G AM EDT Patient notified. Copy to pmd. ------  Notes Recorded by Herminio Commons, MD on 01/07/2016 at 10:39 AM EDT Has CKD. Can fu with PCP.

## 2016-01-20 ENCOUNTER — Encounter (HOSPITAL_COMMUNITY): Payer: Self-pay | Admitting: Emergency Medicine

## 2016-01-20 ENCOUNTER — Ambulatory Visit (HOSPITAL_COMMUNITY): Payer: 59

## 2016-01-20 ENCOUNTER — Emergency Department (HOSPITAL_COMMUNITY): Payer: 59

## 2016-01-20 ENCOUNTER — Other Ambulatory Visit: Payer: Self-pay | Admitting: *Deleted

## 2016-01-20 ENCOUNTER — Ambulatory Visit (HOSPITAL_COMMUNITY)
Admission: RE | Admit: 2016-01-20 | Discharge: 2016-01-20 | Disposition: A | Discharge status: Home / Self Care | Payer: 59 | Source: Ambulatory Visit | Attending: Cardiology | Admitting: Cardiology

## 2016-01-20 ENCOUNTER — Other Ambulatory Visit: Payer: Self-pay

## 2016-01-20 ENCOUNTER — Emergency Department (HOSPITAL_COMMUNITY)
Admission: EM | Admit: 2016-01-20 | Discharge: 2016-01-20 | Disposition: A | Discharge status: Home / Self Care | Payer: 59 | Attending: Emergency Medicine | Admitting: Emergency Medicine

## 2016-01-20 ENCOUNTER — Ambulatory Visit (HOSPITAL_COMMUNITY): Admission: RE | Admit: 2016-01-20 | Payer: 59 | Source: Ambulatory Visit

## 2016-01-20 DIAGNOSIS — R0789 Other chest pain: Secondary | ICD-10-CM | POA: Diagnosis not present

## 2016-01-20 DIAGNOSIS — Z79899 Other long term (current) drug therapy: Secondary | ICD-10-CM | POA: Insufficient documentation

## 2016-01-20 DIAGNOSIS — I77810 Thoracic aortic ectasia: Secondary | ICD-10-CM

## 2016-01-20 DIAGNOSIS — Z7982 Long term (current) use of aspirin: Secondary | ICD-10-CM | POA: Insufficient documentation

## 2016-01-20 DIAGNOSIS — I2510 Atherosclerotic heart disease of native coronary artery without angina pectoris: Secondary | ICD-10-CM

## 2016-01-20 DIAGNOSIS — I517 Cardiomegaly: Secondary | ICD-10-CM

## 2016-01-20 DIAGNOSIS — I10 Essential (primary) hypertension: Secondary | ICD-10-CM | POA: Insufficient documentation

## 2016-01-20 DIAGNOSIS — I714 Abdominal aortic aneurysm, without rupture: Secondary | ICD-10-CM

## 2016-01-20 LAB — CBC
HCT: 41.8 % (ref 39.0–52.0)
HEMOGLOBIN: 14.1 g/dL (ref 13.0–17.0)
MCH: 29.6 pg (ref 26.0–34.0)
MCHC: 33.7 g/dL (ref 30.0–36.0)
MCV: 87.6 fL (ref 78.0–100.0)
PLATELETS: 271 10*3/uL (ref 150–400)
RBC: 4.77 MIL/uL (ref 4.22–5.81)
RDW: 13.2 % (ref 11.5–15.5)
WBC: 7.4 10*3/uL (ref 4.0–10.5)

## 2016-01-20 LAB — BASIC METABOLIC PANEL
ANION GAP: 2 — AB (ref 5–15)
BUN: 13 mg/dL (ref 6–20)
CHLORIDE: 110 mmol/L (ref 101–111)
CO2: 27 mmol/L (ref 22–32)
CREATININE: 1.17 mg/dL (ref 0.61–1.24)
Calcium: 8.2 mg/dL — ABNORMAL LOW (ref 8.9–10.3)
GFR calc non Af Amer: >60 mL/min (ref >60)
Glucose, Bld: 102 mg/dL — ABNORMAL HIGH (ref 65–99)
POTASSIUM: 3.8 mmol/L (ref 3.5–5.1)
SODIUM: 139 mmol/L (ref 135–145)

## 2016-01-20 LAB — TROPONIN I
Troponin I: <0.03 ng/mL (ref <0.03)
Troponin I: <0.03 ng/mL (ref <0.03)

## 2016-01-20 MED ORDER — TRAMADOL HCL 50 MG PO TABS: 50.0000 mg | ORAL_TABLET | Freq: Four times a day (QID) | ORAL | 0 refills | Status: DC | PRN

## 2016-01-20 MED ORDER — MORPHINE SULFATE (PF) 4 MG/ML IV SOLN
4.0000 mg | Freq: Once | INTRAVENOUS | Status: AC
Start: 2016-01-20 — End: 2016-01-20
  Administered 2016-01-20: 4 mg via INTRAVENOUS
  Filled 2016-01-20: qty 1

## 2016-01-20 MED ORDER — IOPAMIDOL (ISOVUE-370) INJECTION 76%
100.0000 mL | Freq: Once | INTRAVENOUS | Status: AC | PRN
  Administered 2016-01-20: 100 mL via INTRAVENOUS

## 2016-01-20 NOTE — Discharge Instructions (Signed)
Follow-up with your family doctor next week for recheck. 

## 2016-01-20 NOTE — ED Provider Notes (Signed)
[facility] DEPT Provider Note   CSN: [id number] Arrival date & time: 01/20/16  1702     History   Chief Complaint Chief Complaint  Patient presents with  . Chest Pain    HPI George Pratt is a 55 y.o. male.  Patient complains of chest pain radiating down his left arm. He had a CT angios today to evaluate dilated aorta which seems to be actually better than before   The history is provided by the patient. No language interpreter was used.  Chest Pain   This is a new problem. The current episode started 12 to 24 hours ago. The problem occurs constantly. The problem has not changed since onset.The pain is associated with movement. The pain is present in the substernal region. The pain is at a severity of 3/10. The pain is mild. The quality of the pain is described as brief. Radiates to: Left arm. Exacerbated by: Not worse with breathing. Pertinent negatives include no abdominal pain, no back pain, no cough and no headaches.  Pertinent negatives for past medical history include no seizures.    Past Medical History:  Diagnosis Date  . Hypertension     Patient Active Problem List   Diagnosis Date Noted  . Chronic constipation 01/14/2015  . Prostate cancer screening 01/14/2015  . Screening for colorectal cancer 01/14/2015  . Fatigue 01/14/2015  . Aortic root dilatation ([place]) 07/14/2014  . Chest pain 02/27/2014  . HTN (hypertension), malignant 02/27/2014    Past Surgical History:  Procedure Laterality Date  . APPENDECTOMY  2006  . COLONOSCOPY N/A 10/01/2015   Procedure: COLONOSCOPY;  Surgeon: Rogene Houston, MD;  Location: [facility];  Service: Endoscopy;  Laterality: N/A;  1030  . KNEE SURGERY    . NECK SURGERY  2000       Home Medications    Prior to Admission medications   Medication Sig Start Date End Date Taking? Authorizing Provider  amLODipine (NORVASC) 10 MG tablet Take 1 tablet by mouth  daily 05/13/15  Yes Herminio Commons, MD  aspirin EC 81 MG  tablet Take 1 tablet (81 mg total) by mouth daily. 10/08/15  Yes Rogene Houston, MD  hydrALAZINE (APRESOLINE) 50 MG tablet Take 1 tablet (50 mg total) by mouth 3 (three) times daily. 01/14/15  Yes Herminio Commons, MD  labetalol (NORMODYNE) 100 MG tablet Take 1 tablet by mouth two  times daily 05/13/15  Yes Herminio Commons, MD  lisinopril (PRINIVIL,ZESTRIL) 40 MG tablet Take 1 tablet by mouth  daily 05/13/15  Yes Herminio Commons, MD  Multiple Vitamins-Minerals (ONE-A-DAY MENS 50+ ADVANTAGE PO) Take 1 tablet by mouth daily.   Yes Historical Provider, MD  traMADol (ULTRAM) 50 MG tablet Take 1 tablet (50 mg total) by mouth every 6 (six) hours as needed. 01/20/16   Milton Ferguson, MD    Family History Family History  Problem Relation Age of Onset  . Hypertension Mother   . Heart failure Mother   . Heart disease Mother   . Hypertension Father   . Diabetes Father   . Vision loss Father   . Hypertension Brother   . Hypertension Brother   . Hypertension Brother     Social History Social History  Substance Use Topics  . Smoking status: Never Smoker  . Smokeless tobacco: Never Used  . Alcohol use No     Allergies   Bee venom   Review of Systems Review of Systems  Constitutional: Negative for appetite  change and fatigue.  HENT: Negative for congestion, ear discharge and sinus pressure.   Eyes: Negative for discharge.  Respiratory: Negative for cough.   Cardiovascular: Positive for chest pain.  Gastrointestinal: Negative for abdominal pain and diarrhea.  Genitourinary: Negative for frequency and hematuria.  Musculoskeletal: Negative for back pain.  Skin: Negative for rash.  Neurological: Negative for seizures and headaches.  Psychiatric/Behavioral: Negative for hallucinations.     Physical Exam Updated Vital Signs BP 137/74 (BP Location: Left Arm)   Pulse 66   Temp 98.3 F (36.8 C) (Oral)   Resp 16   Ht 6\' 1"  (1.854 m)   Wt 231 lb (104.8 kg)   SpO2 97%   BMI  30.48 kg/m   Physical Exam  Constitutional: He is oriented to person, place, and time. He appears well-developed.  HENT:  Head: Normocephalic.  Eyes: Conjunctivae and EOM are normal. No scleral icterus.  Neck: Neck supple. No thyromegaly present.  Cardiovascular: Normal rate and regular rhythm.  Exam reveals no gallop and no friction rub.   No murmur heard. Tender left lateral chest  Pulmonary/Chest: No stridor. He has no wheezes. He has no rales. He exhibits no tenderness.  Abdominal: He exhibits no distension. There is no tenderness. There is no rebound.  Musculoskeletal: Normal range of motion. He exhibits no edema.  Lymphadenopathy:    He has no cervical adenopathy.  Neurological: He is oriented to person, place, and time. He exhibits normal muscle tone. Coordination normal.  Skin: No rash noted. No erythema.  Psychiatric: He has a normal mood and affect. His behavior is normal.     ED Treatments / Results  Labs (all labs ordered are listed, but only abnormal results are displayed) Labs Reviewed  BASIC METABOLIC PANEL - Abnormal; Notable for the following:       Result Value   Glucose, Bld 102 (*)    Calcium 8.2 (*)    Anion gap 2 (*)    All other components within normal limits  CBC  TROPONIN I  TROPONIN I    EKG  EKG Interpretation  Date/Time:  Wednesday January 20 2016 17:04:41 EDT Ventricular Rate:  73 PR Interval:  184 QRS Duration: 110 QT Interval:  406 QTC Calculation: 447 R Axis:   10 Text Interpretation:  Normal sinus rhythm Normal ECG Confirmed by Tamarra Geiselman  MD, [facility] John [phone]) on 01/20/2016 6:47:11 PM Also confirmed by Virlee Stroschein  MD, [facility] John [phone])  on 01/20/2016 6:47:31 PM       Radiology Dg Chest 2 View  Result Date: 01/20/2016 CLINICAL DATA:  Chest pain, shortness of breath following chest CT EXAM: CHEST  2 VIEW COMPARISON:  CT chest dated 01/20/2016 FINDINGS: Lungs are clear.  No pleural effusion or pneumothorax. The heart is normal in size.  Degenerative changes of the visualized thoracolumbar spine. IMPRESSION: Normal chest radiographs. Electronically Signed   By: Julian Hy M.D.   On: 01/20/2016 17:35   Ct Angio Chest Aorta W &/or Wo Contrast  Result Date: 01/20/2016 CLINICAL DATA:  Follow-up aortic root dilatation. Malignant hypertension. EXAM: CT ANGIOGRAPHY CHEST WITH CONTRAST TECHNIQUE: Multidetector CT imaging of the chest was performed using the standard protocol during bolus administration of intravenous contrast. Multiplanar CT image reconstructions and MIPs were obtained to evaluate the vascular anatomy. CONTRAST:  100 cc Isovue 370 IV COMPARISON:  06/02/2013 FINDINGS: Cardiovascular: Slight aortic root dilatation, 4.2 cm maximally on coronal imaging today compared with 4.5 cm previously. Scattered left anterior descending coronary artery calcifications. Remainder of  the aorta is normal caliber. No dissection. Heart is mildly enlarged. Mediastinum/Nodes: No mediastinal, hilar, or axillary adenopathy. Lungs/Pleura: Hypodensities within the liver compatible with small cysts, stable. No acute findings in the upper abdomen. Upper Abdomen: Chest wall soft tissues are unremarkable. Musculoskeletal: No acute bony abnormality or focal bone lesion. Review of the MIP images confirms the above findings. 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 Mild cardiomegaly. Early calcifications in the left anterior descending coronary artery. No acute cardiopulmonary disease. Electronically Signed   By: Rolm Baptise M.D.   On: 01/20/2016 12:26    Procedures Procedures (including critical care time)  Medications Ordered in ED Medications  morphine 4 MG/ML injection 4 mg (4 mg Intravenous Given 01/20/16 1918)     Initial Impression / Assessment and Plan / ED Course  I have reviewed the triage vital signs and the nursing notes.  Pertinent labs & imaging results that  were available during my care of the patient were reviewed by me and considered in my medical decision making (see chart for details).  Clinical Course   Patient with chest wall pain. Patient has had 2 negative troponins. He'll follow-up with his PCP and is given some Ultram for discomfort  Final Clinical Impressions(s) / ED Diagnoses   Final diagnoses:  Other chest pain    New Prescriptions New Prescriptions   TRAMADOL (ULTRAM) 50 MG TABLET    Take 1 tablet (50 mg total) by mouth every 6 (six) hours as needed.     Milton Ferguson, MD 01/20/16 2111

## 2016-01-20 NOTE — ED Triage Notes (Signed)
Pt reports LT sided CP that radiates to LT arm and neck that began around 1330 this afternoon. Pt states he had a CT Angio of his chest earlier today.

## 2016-01-20 NOTE — ED Notes (Signed)
Pt reports chest pain and left arm pain since having a CT with contrast around noon today.  Denies associated symptoms.

## 2016-02-05 ENCOUNTER — Encounter: Payer: Self-pay | Admitting: Adult Health

## 2016-02-05 ENCOUNTER — Ambulatory Visit (INDEPENDENT_AMBULATORY_CARE_PROVIDER_SITE_OTHER): Payer: 59 | Admitting: Adult Health

## 2016-02-05 VITALS — BP 128/70 | HR 73 | Ht 73.0 in | Wt 229.0 lb

## 2016-02-05 DIAGNOSIS — R0789 Other chest pain: Secondary | ICD-10-CM

## 2016-02-05 DIAGNOSIS — I10 Essential (primary) hypertension: Secondary | ICD-10-CM | POA: Diagnosis not present

## 2016-02-05 NOTE — Progress Notes (Signed)
Name: George Pratt    DOB: April 29, 1961  Age: 55 y.o.  MR#: [id number]       PCP:  Karis Juba, PA-C      Insurance: Payor: [employer] / Plan: [employer] OTHER / Product Type: *No Product type* /   CC:   No chief complaint on file.   VS Vitals:   02/05/16 1532  Weight: 229 lb (103.9 kg)  Height: 6\' 1"  (1.854 m)    Weights Current Weight  02/05/16 229 lb (103.9 kg)  01/20/16 231 lb (104.8 kg)  01/06/16 231 lb (104.8 kg)    Blood Pressure  BP Readings from Last 3 Encounters:  01/20/16 136/79  01/06/16 136/80  10/01/15 138/83     Admit date:  (Not on file) Last encounter with RMR:  Visit date not found   Allergy Bee venom  Current Outpatient Prescriptions  Medication Sig Dispense Refill  . amLODipine (NORVASC) 10 MG tablet Take 1 tablet by mouth  daily 90 tablet 3  . aspirin EC 81 MG tablet Take 1 tablet (81 mg total) by mouth daily.    . hydrALAZINE (APRESOLINE) 50 MG tablet Take 1 tablet (50 mg total) by mouth 3 (three) times daily. 270 tablet 3  . labetalol (NORMODYNE) 100 MG tablet Take 1 tablet by mouth two  times daily 180 tablet 3  . lisinopril (PRINIVIL,ZESTRIL) 40 MG tablet Take 1 tablet by mouth  daily 90 tablet 3  . Multiple Vitamins-Minerals (ONE-A-DAY MENS 50+ ADVANTAGE PO) Take 1 tablet by mouth daily.    . traMADol (ULTRAM) 50 MG tablet Take 1 tablet (50 mg total) by mouth every 6 (six) hours as needed. 15 tablet 0   No current facility-administered medications for this visit.     Discontinued Meds:   There are no discontinued medications.  Patient Active Problem List   Diagnosis Date Noted  . Chronic constipation 01/14/2015  . Prostate cancer screening 01/14/2015  . Screening for colorectal cancer 01/14/2015  . Fatigue 01/14/2015  . Aortic root dilatation ([place]) 07/14/2014  . Chest pain 02/27/2014  . HTN (hypertension), malignant 02/27/2014    LABS    Component Value Date/Time   NA 139 01/20/2016 1721   NA 143 01/06/2016 0922    NA 143 05/01/2014 1553   K 3.8 01/20/2016 1721   K 4.3 01/06/2016 0922   K 4.0 05/01/2014 1553   CL 110 01/20/2016 1721   CL 106 01/06/2016 0922   CL 107 05/01/2014 1553   CO2 27 01/20/2016 1721   CO2 26 01/06/2016 0922   CO2 27 05/01/2014 1553   GLUCOSE 102 (H) 01/20/2016 1721   GLUCOSE 109 (H) 01/06/2016 0922   GLUCOSE 134 (H) 05/01/2014 1553   BUN 13 01/20/2016 1721   BUN 15 01/06/2016 0922   BUN 20 05/01/2014 1553   CREATININE 1.17 01/20/2016 1721   CREATININE 1.40 (H) 01/06/2016 0922   CREATININE 1.19 05/01/2014 1553   CREATININE 1.36 (H) 03/26/2014 1133   CALCIUM 8.2 (L) 01/20/2016 1721   CALCIUM 9.4 01/06/2016 0922   CALCIUM 9.2 05/01/2014 1553   GFRNONAA >60 01/20/2016 1721   GFRNONAA 77 (L) 02/07/2014 1635   GFRAA >60 01/20/2016 1721   GFRAA 89 (L) 02/07/2014 1635   CMP     Component Value Date/Time   NA 139 01/20/2016 1721   K 3.8 01/20/2016 1721   CL 110 01/20/2016 1721   CO2 27 01/20/2016 1721   GLUCOSE 102 (H) 01/20/2016 1721   BUN 13  01/20/2016 1721   CREATININE 1.17 01/20/2016 1721   CREATININE 1.40 (H) 01/06/2016 0922   CALCIUM 8.2 (L) 01/20/2016 1721   PROT 6.9 02/07/2014 1635   ALBUMIN 4.0 02/07/2014 1635   AST 18 02/07/2014 1635   ALT 20 02/07/2014 1635   ALKPHOS 82 02/07/2014 1635   BILITOT 0.5 02/07/2014 1635   GFRNONAA >60 01/20/2016 1721   GFRAA >60 01/20/2016 1721       Component Value Date/Time   WBC 7.4 01/20/2016 1721   WBC 7.4 01/14/2015 1445   WBC 7.8 02/07/2014 1635   HGB 14.1 01/20/2016 1721   HGB 14.2 01/14/2015 1445   HGB 15.7 02/07/2014 1635   HCT 41.8 01/20/2016 1721   HCT 42.2 01/14/2015 1445   HCT 44.2 02/07/2014 1635   MCV 87.6 01/20/2016 1721   MCV 85.1 01/14/2015 1445   MCV 85.2 02/07/2014 1635    Lipid Panel  No results found for: CHOL, TRIG, HDL, CHOLHDL, VLDL, LDLCALC, LDLDIRECT  ABG No results found for: PHART, PCO2ART, PO2ART, HCO3, TCO2, ACIDBASEDEF, O2SAT   Lab Results  Component Value Date    TSH 1.775 01/14/2015   BNP (last 3 results) No results for input(s): BNP in the last 8760 hours.  ProBNP (last 3 results) No results for input(s): PROBNP in the last 8760 hours.  Cardiac Panel (last 3 results) No results for input(s): CKTOTAL, CKMB, TROPONINI, RELINDX in the last 72 hours.  Iron/TIBC/Ferritin/ %Sat No results found for: IRON, TIBC, FERRITIN, IRONPCTSAT   EKG Orders placed or performed during the hospital encounter of 01/20/16  . ED EKG within 10 minutes  . ED EKG within 10 minutes  . EKG     Prior Assessment and Plan Problem List as of 02/05/2016 Reviewed: 01/06/2016  8:31 AM by Kate Sable, MD     Cardiovascular and Mediastinum   HTN (hypertension), malignant   Aortic root dilatation [facility])     Digestive   Chronic constipation     Other   Chest pain   Prostate cancer screening   Screening for colorectal cancer   Fatigue       Imaging: Dg Chest 2 View  Result Date: 01/20/2016 CLINICAL DATA:  Chest pain, shortness of breath following chest CT EXAM: CHEST  2 VIEW COMPARISON:  CT chest dated 01/20/2016 FINDINGS: Lungs are clear.  No pleural effusion or pneumothorax. The heart is normal in size. Degenerative changes of the visualized thoracolumbar spine. IMPRESSION: Normal chest radiographs. Electronically Signed   By: Julian Hy M.D.   On: 01/20/2016 17:35   Ct Angio Chest Aorta W &/or Wo Contrast  Result Date: 01/20/2016 CLINICAL DATA:  Follow-up aortic root dilatation. Malignant hypertension. EXAM: CT ANGIOGRAPHY CHEST WITH CONTRAST TECHNIQUE: Multidetector CT imaging of the chest was performed using the standard protocol during bolus administration of intravenous contrast. Multiplanar CT image reconstructions and MIPs were obtained to evaluate the vascular anatomy. CONTRAST:  100 cc Isovue 370 IV COMPARISON:  06/02/2013 FINDINGS: Cardiovascular: Slight aortic root dilatation, 4.2 cm maximally on coronal imaging today compared with 4.5 cm  previously. Scattered left anterior descending coronary artery calcifications. Remainder of the aorta is normal caliber. No dissection. Heart is mildly enlarged. Mediastinum/Nodes: No mediastinal, hilar, or axillary adenopathy. Lungs/Pleura: Hypodensities within the liver compatible with small cysts, stable. No acute findings in the upper abdomen. Upper Abdomen: Chest wall soft tissues are unremarkable. Musculoskeletal: No acute bony abnormality or focal bone lesion. Review of the MIP images confirms the above findings. IMPRESSION: Slight aneurysmal dilatation  of the aortic root, 4.2 cm maximally today compared with 4.5 cm previously. Mild cardiomegaly. Early calcifications in the left anterior descending coronary artery. No acute cardiopulmonary disease. Electronically Signed   By: Rolm Baptise M.D.   On: 01/20/2016 12:26

## 2016-02-05 NOTE — Patient Instructions (Signed)
Your physician wants you to follow-up in: 1 Year with Dr. Koneswaran.  You will receive a reminder letter in the mail two months in advance. If you don't receive a letter, please call our office to schedule the follow-up appointment.  Your physician recommends that you continue on your current medications as directed. Please refer to the Current Medication list given to you today.  If you need a refill on your cardiac medications before your next appointment, please call your pharmacy.  Thank you for choosing [facility]!   

## 2016-02-05 NOTE — Progress Notes (Signed)
Cardiology Office Note   Date:  02/05/2016   ID:  George, Pratt 16-Sep-1960, MRN [id number]  PCP:  George Juba, PA-C  Cardiologist:  George Chen, NP   No chief complaint on file.     History of Present Illness: George Pratt is a 55 y.o. male who presents for ongoing assessment and management of malignant hypertension. The patient was last seen by Dr. Bronson Pratt on 01/06/2016. The patient's blood pressure was controlled on last office visit dated 01/06/2016, CTA of the thoracic aorta in 11 2015 demonstrated mild dilatation with maximum diameter 4.4 cm with no ascending aortic pathology.  He was seen in the emergency room on 01/20/2016 with complaints of chest pain radiating down his left arm. He was ruled out for ACS with negative troponin. He was to follow-up with primary care. He was given a prescription for Ultram.   He comes today with continued  He also is complaining of  Neck and shoulder pain. He has a plate in his cervical spine. He denies dyspnea on exertion. 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  Past Medical History:  Diagnosis Date  . Hypertension     Past Surgical History:  Procedure Laterality Date  . APPENDECTOMY  2006  . COLONOSCOPY N/A 10/01/2015   Procedure: COLONOSCOPY;  Surgeon: George Houston, MD;  Location: [facility];  Service: Endoscopy;  Laterality: N/A;  1030  . KNEE SURGERY    . NECK SURGERY  2000     Current Outpatient Prescriptions  Medication Sig Dispense Refill  . amLODipine (NORVASC) 10 MG tablet Take 1 tablet by mouth  daily 90 tablet 3  . aspirin EC 81 MG tablet Take 1 tablet (81 mg total) by mouth daily.    . hydrALAZINE (APRESOLINE) 50 MG tablet Take 1 tablet (50 mg total) by mouth 3 (three) times daily. 270 tablet 3  . labetalol (NORMODYNE) 100 MG tablet Take 1 tablet by mouth two  times daily 180 tablet 3  . lisinopril (PRINIVIL,ZESTRIL) 40 MG tablet Take 1  tablet by mouth  daily 90 tablet 3  . Multiple Vitamins-Minerals (ONE-A-DAY MENS 50+ ADVANTAGE PO) Take 1 tablet by mouth daily.    . traMADol (ULTRAM) 50 MG tablet Take 1 tablet (50 mg total) by mouth every 6 (six) hours as needed. 15 tablet 0   No current facility-administered medications for this visit.     Allergies:   Bee venom    Social History:  The patient  reports that he has never smoked. He has never used smokeless tobacco. He reports that he does not drink alcohol or use drugs.   Family History:  The patient's family history includes Diabetes in his father; Heart disease in his mother; Heart failure in his mother; Hypertension in his brother, brother, brother, father, and mother; Vision loss in his father.    ROS: All other systems are reviewed and negative. Unless otherwise mentioned in H&P    PHYSICAL EXAM: VS:  BP 128/70   Pulse 73   Ht 6\' 1"  (1.854 m)   Wt 229 lb (103.9 kg)   SpO2 96%   BMI 30.21 kg/m  , BMI Body mass index is 30.21 kg/m. GEN: Well nourished, well developed, in no acute distress  HEENT: normal  Neck: no JVD, carotid bruits, or masses Cardiac: RRR; no murmurs, rubs, or gallops,no edema  Respiratory:  clear to auscultation bilaterally, normal work  of breathing GI: soft, nontender, nondistended, + BS MS: no deformity or atrophy  Skin: warm and dry, no rash Neuro:  Strength and sensation are intact Psych: euthymic mood, full affect  Recent Labs: 01/20/2016: BUN 13; Creatinine, Ser 1.17; Hemoglobin 14.1; Platelets 271; Potassium 3.8; Sodium 139    Lipid Panel No results found for: CHOL, TRIG, HDL, CHOLHDL, VLDL, LDLCALC, LDLDIRECT    Wt Readings from Last 3 Encounters:  02/05/16 229 lb (103.9 kg)  01/20/16 231 lb (104.8 kg)  01/06/16 231 lb (104.8 kg)      ASSESSMENT AND PLAN:  1.  Chronic chest pain: he has been ruled out for ACS.  Likely musculoskeletal , posture related due to a lot of computer work versus cervical spine irritation  , as he has spinal issues in the past.  2.  Hypertension: on multiple medications for blood pressure control to include lisinopril labetalol hydralazine and amlodipine blood pressure is currently controlled.     Current medicines are reviewed at length with the patient today.    Labs/ tests ordered today include:  No orders of the defined types were placed in this encounter.    Disposition:   FU with one year  Signed, Jory Sims, NP  02/05/2016 4:32 PM    [facility] [address], [place], [place] [address] Phone: [phone]; Fax: [phone]

## 2016-02-10 ENCOUNTER — Encounter: Payer: 59 | Admitting: Physician Assistant

## 2016-03-02 ENCOUNTER — Ambulatory Visit (INDEPENDENT_AMBULATORY_CARE_PROVIDER_SITE_OTHER): Payer: 59 | Admitting: Physician Assistant

## 2016-03-02 ENCOUNTER — Encounter: Payer: Self-pay | Admitting: Physician Assistant

## 2016-03-02 VITALS — BP 118/60 | HR 74 | Temp 98.1°F | Resp 16 | Wt 229.0 lb

## 2016-03-02 DIAGNOSIS — Z125 Encounter for screening for malignant neoplasm of prostate: Secondary | ICD-10-CM | POA: Diagnosis not present

## 2016-03-02 DIAGNOSIS — I77810 Thoracic aortic ectasia: Secondary | ICD-10-CM | POA: Diagnosis not present

## 2016-03-02 DIAGNOSIS — Z0000 Encounter for general adult medical examination without abnormal findings: Secondary | ICD-10-CM | POA: Diagnosis not present

## 2016-03-02 DIAGNOSIS — K5909 Other constipation: Secondary | ICD-10-CM | POA: Diagnosis not present

## 2016-03-02 DIAGNOSIS — I10 Essential (primary) hypertension: Secondary | ICD-10-CM

## 2016-03-02 DIAGNOSIS — Z1212 Encounter for screening for malignant neoplasm of rectum: Secondary | ICD-10-CM

## 2016-03-02 DIAGNOSIS — Z1211 Encounter for screening for malignant neoplasm of colon: Secondary | ICD-10-CM | POA: Diagnosis not present

## 2016-03-02 DIAGNOSIS — Z23 Encounter for immunization: Secondary | ICD-10-CM | POA: Diagnosis not present

## 2016-03-02 MED ORDER — LINACLOTIDE 72 MCG PO CAPS: 72.0000 ug | ORAL_CAPSULE | Freq: Every day | ORAL | 3 refills | Status: DC

## 2016-03-02 NOTE — Addendum Note (Signed)
Addended by: Vonna Kotyk A on: 03/02/2016 04:40 PM   Modules accepted: Orders

## 2016-03-02 NOTE — Progress Notes (Signed)
Patient ID: DRAGO MCKENDRICK MRN: [id number], DOB: Mar 03, 1961, 55 y.o. Date of Encounter: @DATE @  Chief Complaint:  Chief Complaint  Patient presents with  . Annual Exam    PHQ SCORE 0    HPI: 55 y.o. year old white male presents for CPE.   01/14/2015: presents to establish care.  He states that he has never had a primary care provider. Says that his father is a pediatrician who practiced in [place]. Says that he did not retire until age 39 and he is now 3. Says his father has just always treated any medical problems he has had, so that's why patient had not established primary care until now.  Says that he does see Cardiologist routinely.  Says that he sees an eye doctor once a year just for his eyeglasses. Says that he has no other problems with his eyes. No glaucoma etc.  Says that he sees no other medical providers.  Says that the only problem he has been having is constipation. Says he's been having issues with this for the past year. Uses over-the-counter laxitive as needed. Says uses this about once a week on average. However says that he has problems with constipation every day but he just waits until it gets really bad before he uses medication. No other complaints or concerns.  03/02/2016: Presents for CPE.  He has no complaints or concerns today. Reviewed that at last visit with me he had reported some constipation and I had prescribed Linzess. Today I was asked if he was using this medication. He states that he uses it occasionally. Is that he is using Dulcolax every other day. I asked why he is not using the [place]. He says that it causes too much diarrhea. Told him that since then they have come out with a new lower dose of Linzess and he is agreeable to try this. He has no complaints or specific concerns to address today. Says that he has been feeling okay.   Past Medical History:  Diagnosis Date  . Hypertension      Home Meds: Outpatient Medications Prior to  Visit  Medication Sig Dispense Refill  . amLODipine (NORVASC) 10 MG tablet Take 1 tablet by mouth  daily 90 tablet 3  . aspirin EC 81 MG tablet Take 1 tablet (81 mg total) by mouth daily.    . hydrALAZINE (APRESOLINE) 50 MG tablet Take 1 tablet (50 mg total) by mouth 3 (three) times daily. 270 tablet 3  . labetalol (NORMODYNE) 100 MG tablet Take 1 tablet by mouth two  times daily 180 tablet 3  . lisinopril (PRINIVIL,ZESTRIL) 40 MG tablet Take 1 tablet by mouth  daily 90 tablet 3  . Multiple Vitamins-Minerals (ONE-A-DAY MENS 50+ ADVANTAGE PO) Take 1 tablet by mouth daily.    . traMADol (ULTRAM) 50 MG tablet Take 1 tablet (50 mg total) by mouth every 6 (six) hours as needed. 15 tablet 0   No facility-administered medications prior to visit.     Allergies:  Allergies  Allergen Reactions  . Bee Venom Swelling    Social History   Social History  . Marital status: Married    Spouse name: N/A  . Number of children: N/A  . Years of education: N/A   Occupational History  . Not on file.   Social History Main Topics  . Smoking status: Never Smoker  . Smokeless tobacco: Never Used  . Alcohol use No  . Drug use: No  . Sexual activity:  Yes   Other Topics Concern  . Not on file   Social History Narrative   Married.    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    --Is available for students to call him, email him, etc   Says it is for a group of multiple colleges--in [facility], etc.    Family History  Problem Relation Age of Onset  . Hypertension Mother   . Heart failure Mother   . Heart disease Mother   . Hypertension Father   . Diabetes Father   . Vision loss Father   . Hypertension Brother   . Hypertension Brother   . Hypertension Brother      Review of Systems:  See HPI for pertinent ROS. All other ROS negative.    Physical Exam: Blood pressure 118/60, pulse 74, temperature 98.1 F (36.7 C), temperature source Oral, resp. rate  16, weight 229 lb (103.9 kg), SpO2 98 %., Body mass index is 30.21 kg/m. General: WNWD WM. Appears in no acute distress. Head: Normocephalic, atraumatic, eyes without discharge, sclera non-icteric, nares are without discharge. Bilateral auditory canals clear, TM's are without perforation, pearly grey and translucent with reflective cone of light bilaterally. Oral cavity moist, posterior pharynx without exudate, erythema, peritonsillar abscess, or post nasal drip.  Neck: Supple. No thyromegaly. No lymphadenopathy.No carotid bruit. Lungs: Clear bilaterally to auscultation without wheezes, rales, or rhonchi. Breathing is unlabored. Heart: RRR with S1 S2. No murmurs, rubs, or gallops. Abdomen: Soft, non-tender, non-distended with normoactive bowel sounds. No hepatomegaly. No rebound/guarding. No obvious abdominal masses. Musculoskeletal:  Strength and tone normal for age. DRE: Deferred by patient Extremities/Skin: Warm and dry.  No edema. No rashes or suspicious lesions. Neuro: Alert and oriented X 3. Moves all extremities spontaneously. Gait is normal. CNII-XII grossly in tact. Psych:  Responds to questions appropriately with a normal affect.     ASSESSMENT AND PLAN:  55 y.o. year old male with    1. Encounter for preventive health examination  A. Screening Labs: He is not fasting today but states that he can return fasting for labs this week. - CBC with Differential/Platelet; Future - COMPLETE METABOLIC PANEL WITH GFR; Future - Lipid panel; Future - TSH; Future - PSA; Future  B. Prostate cancer screening - PSA; Future  C. Screening for colorectal cancer He had colonoscopy by Dr. Claiborne Rigg 10/01/15. This revealed 1 polyp. Diverticulosis. External hemorrhoids. A did not see documented when he is to repeat this but patient states was told to repeat 7-10 years.  D. Immunizations: Flu: Agreeable to receive flu vaccine today. Flu vaccine given here 03/02/16 Tetanus: UTD-- States last  tetanus was in 2012 Pneumonia vaccine--- he has no indication to require this until age 55 Zostavax-----will discuss at age 55    Chronic constipation I gave him 2 sample bottles of Loinzess 72 g--- 4 tablets in each--- for total of 8.  He is to use these prior to filling the Rx to make sure that he tolerates this without any adverse effects. Rxed  #90 so he can use the savings cards are provided and get #90 for $30. - linaclotide (LINZESS) 72 MCG capsule; Take 1 capsule (72 mcg total) by mouth daily before breakfast.  Dispense: 90 capsule; Refill: 3   2. HTN (hypertension), malignant Blood pressure is now controlled. Sees cardiology routinely.  3. Aortic root dilatation Managed by Cardiology   Signed, Olean Ree [place], [place], [facility] 03/02/2016 10:17 AM

## 2016-03-03 ENCOUNTER — Other Ambulatory Visit: Payer: Medicare Other

## 2016-03-03 ENCOUNTER — Telehealth: Payer: Self-pay

## 2016-03-03 DIAGNOSIS — Z125 Encounter for screening for malignant neoplasm of prostate: Secondary | ICD-10-CM

## 2016-03-03 DIAGNOSIS — Z0000 Encounter for general adult medical examination without abnormal findings: Secondary | ICD-10-CM

## 2016-03-03 LAB — CBC WITH DIFFERENTIAL/PLATELET
BASOS PCT: 1 %
Basophils Absolute: 73 cells/uL (ref 0–200)
EOS PCT: 3 %
Eosinophils Absolute: 219 cells/uL (ref 15–500)
HCT: 41.9 % (ref 38.5–50.0)
Hemoglobin: 14.5 g/dL (ref 13.0–17.0)
LYMPHS PCT: 18 %
Lymphs Abs: 1314 cells/uL (ref 850–3900)
MCH: 29.4 pg (ref 27.0–33.0)
MCHC: 34.6 g/dL (ref 32.0–36.0)
MCV: 85 fL (ref 80.0–100.0)
MONO ABS: 657 {cells}/uL (ref 200–950)
MONOS PCT: 9 %
MPV: 10 fL (ref 7.5–12.5)
Neutro Abs: 5037 cells/uL (ref 1500–7800)
Neutrophils Relative %: 69 %
PLATELETS: 296 10*3/uL (ref 140–400)
RBC: 4.93 MIL/uL (ref 4.20–5.80)
RDW: 13.4 % (ref 11.0–15.0)
WBC: 7.3 10*3/uL (ref 3.8–10.8)

## 2016-03-03 LAB — COMPLETE METABOLIC PANEL WITH GFR
ALT: 10 U/L (ref 9–46)
AST: 12 U/L (ref 10–35)
Albumin: 4.3 g/dL (ref 3.6–5.1)
Alkaline Phosphatase: 58 U/L (ref 40–115)
BUN: 14 mg/dL (ref 7–25)
CHLORIDE: 106 mmol/L (ref 98–110)
CO2: 26 mmol/L (ref 20–31)
CREATININE: 1.25 mg/dL (ref 0.70–1.33)
Calcium: 9.2 mg/dL (ref 8.6–10.3)
GFR, Est African American: 73 mL/min (ref >=60)
GFR, Est Non African American: 64 mL/min (ref >=60)
Glucose, Bld: 86 mg/dL (ref 70–99)
POTASSIUM: 4.5 mmol/L (ref 3.5–5.3)
SODIUM: 143 mmol/L (ref 135–146)
Total Bilirubin: 0.5 mg/dL (ref 0.2–1.2)
Total Protein: 6.3 g/dL (ref 6.1–8.1)

## 2016-03-03 LAB — LIPID PANEL
CHOL/HDL RATIO: 4.1 ratio (ref <=5.0)
Cholesterol: 154 mg/dL (ref 125–200)
HDL: 38 mg/dL — ABNORMAL LOW (ref >=40)
LDL CALC: 103 mg/dL (ref <130)
Triglycerides: 66 mg/dL (ref <150)
VLDL: 13 mg/dL (ref <30)

## 2016-03-03 LAB — TSH: TSH: 1.12 mIU/L (ref 0.40–4.50)

## 2016-03-03 LAB — PSA: PSA: 0.8 ng/mL (ref <=4.0)

## 2016-03-03 NOTE — Telephone Encounter (Signed)
Fax came in for RX Linzess 58 MCG  PA has been submitted

## 2016-03-29 ENCOUNTER — Other Ambulatory Visit: Payer: Self-pay | Admitting: Cardiovascular Disease

## 2016-03-30 NOTE — Telephone Encounter (Signed)
PA was approved Pharmacy aware

## 2016-04-07 ENCOUNTER — Encounter: Payer: Self-pay | Admitting: Physician Assistant

## 2016-04-08 ENCOUNTER — Telehealth: Payer: Self-pay

## 2016-04-08 DIAGNOSIS — K5909 Other constipation: Secondary | ICD-10-CM

## 2016-04-08 MED ORDER — LINACLOTIDE 72 MCG PO CAPS: 72.0000 ug | ORAL_CAPSULE | Freq: Every day | ORAL | 0 refills | Status: DC

## 2016-04-08 NOTE — Telephone Encounter (Signed)
Rx sent to mail order

## 2016-08-27 ENCOUNTER — Other Ambulatory Visit: Payer: Self-pay | Admitting: Cardiovascular Disease

## 2017-02-06 ENCOUNTER — Other Ambulatory Visit: Payer: Self-pay | Admitting: Cardiovascular Disease

## 2017-03-02 ENCOUNTER — Ambulatory Visit: Payer: Medicare Other | Admitting: Physician Assistant

## 2017-03-08 ENCOUNTER — Encounter: Payer: Self-pay | Admitting: Physician Assistant

## 2017-03-08 ENCOUNTER — Ambulatory Visit (INDEPENDENT_AMBULATORY_CARE_PROVIDER_SITE_OTHER): Payer: 59 | Admitting: Physician Assistant

## 2017-03-08 VITALS — BP 154/82 | HR 76 | Temp 98.0°F | Resp 14 | Ht 73.0 in | Wt 236.0 lb

## 2017-03-08 DIAGNOSIS — Z23 Encounter for immunization: Secondary | ICD-10-CM | POA: Diagnosis not present

## 2017-03-08 DIAGNOSIS — Z125 Encounter for screening for malignant neoplasm of prostate: Secondary | ICD-10-CM | POA: Diagnosis not present

## 2017-03-08 DIAGNOSIS — Z0000 Encounter for general adult medical examination without abnormal findings: Secondary | ICD-10-CM | POA: Diagnosis not present

## 2017-03-08 DIAGNOSIS — Z114 Encounter for screening for human immunodeficiency virus [HIV]: Secondary | ICD-10-CM | POA: Diagnosis not present

## 2017-03-08 DIAGNOSIS — Z1159 Encounter for screening for other viral diseases: Secondary | ICD-10-CM | POA: Diagnosis not present

## 2017-03-08 DIAGNOSIS — I10 Essential (primary) hypertension: Secondary | ICD-10-CM | POA: Diagnosis not present

## 2017-03-08 DIAGNOSIS — Z79899 Other long term (current) drug therapy: Secondary | ICD-10-CM | POA: Diagnosis not present

## 2017-03-08 DIAGNOSIS — R5383 Other fatigue: Secondary | ICD-10-CM | POA: Diagnosis not present

## 2017-03-08 NOTE — Progress Notes (Signed)
Patient ID: George Pratt MRN: [id number], DOB: Jun 19, 1960, 56 y.o. Date of Encounter: @DATE @  Chief Complaint:  Chief Complaint  Patient presents with  . CPE    is fasting    HPI: 56 y.o. year old white male presents for CPE.   01/14/2015: presents to establish care.  He states that he has never had a primary care provider. Says that his father is a pediatrician who practiced in [place]. Says that he did not retire until age 45 and he is now 53. Says his father has just always treated any medical problems he has had, so that's why patient had not established primary care until now.  Says that he does see Cardiologist routinely.  Says that he sees an eye doctor once a year just for his eyeglasses. Says that he has no other problems with his eyes. No glaucoma etc.  Says that he sees no other medical providers.  Says that the only problem he has been having is constipation. Says he's been having issues with this for the past year. Uses over-the-counter laxitive as needed. Says uses this about once a week on average. However says that he has problems with constipation every day but he just waits until it gets really bad before he uses medication. No other complaints or concerns.   03/02/2016: Presents for CPE.  He has no complaints or concerns today. Reviewed that at last visit with me he had reported some constipation and I had prescribed Linzess. Today I was asked if he was using this medication. He states that he uses it occasionally. Is that he is using Dulcolax every other day. I asked why he is not using the [place]. He says that it causes too much diarrhea. Told him that since then they have come out with a new lower dose of Linzess and he is agreeable to try this. He has no complaints or specific concerns to address today. Says that he has been feeling okay.    03/08/2017: His wife accompanies him for visit today. She also has an appointment with me today for CPE. Their  daughter is now 77 years old and is in kindergarten. Husband adds that 2 of his grandchildren also live with them. Says that they have full custody of them. Says that they were taken from his daughter and so they have had full custody of them for years now. They say that they do let those children visit with their mother. Husband says that he "keeps thinking that they will get their act together, but then they don't, and it has been 6 years so he doesn't know that they will." They also add that since April, husband's 56 year old father has been living with them also. He is a retired Lexicographer. They state that things in their household are busy. Patient reports that he got a promotion at work and a new role at work. Now is a full-time professor and Nature conservation officer. Says that it actually is less stressful. 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 Says now he can actually just sit and take a break between his work. He has no concerns to address. States that he has been feeling good. States that he did not realize that he had an appointment scheduled with cardiology in September and missed that appointment but has rescheduled. I noted that his blood pressure is reading a little high today. He says  that that is because of the way he takes his medications and doses that through the day and has not taken his dose this morning because he is fasting. Says that his blood pressure usually runs good when he checks it. Also does have follow-up appointment with cardiology. Asked about his constipation and use of Linzess-  at last visit I gave him the lower dose--47mcg. He states that he uses this as needed, but not every day.      Past Medical History:  Diagnosis Date  . Hypertension      Home Meds: Outpatient Medications Prior to Visit  Medication Sig Dispense Refill  . amLODipine (NORVASC) 10 MG tablet TAKE 1 TABLET BY  MOUTH  DAILY 30 tablet 0  . aspirin EC 81 MG tablet Take 1 tablet (81 mg total) by mouth daily.    . hydrALAZINE (APRESOLINE) 50 MG tablet TAKE 1 TABLET BY MOUTH 3  TIMES DAILY 270 tablet 3  . labetalol (NORMODYNE) 100 MG tablet Take 1 tablet by mouth two  times daily 180 tablet 3  . linaclotide (LINZESS) 72 MCG capsule Take 1 capsule (72 mcg total) by mouth daily before breakfast. (Patient taking differently: Take 72 mcg by mouth daily as needed. ) 90 capsule 0  . lisinopril (PRINIVIL,ZESTRIL) 40 MG tablet TAKE 1 TABLET BY MOUTH  DAILY 30 tablet 0  . Multiple Vitamins-Minerals (ONE-A-DAY MENS 50+ ADVANTAGE PO) Take 1 tablet by mouth daily.    . traMADol (ULTRAM) 50 MG tablet Take 1 tablet (50 mg total) by mouth every 6 (six) hours as needed. 15 tablet 0   No facility-administered medications prior to visit.     Allergies:  Allergies  Allergen Reactions  . Bee Venom Swelling    Social History   Social History  . Marital status: Married    Spouse name: N/A  . Number of children: N/A  . Years of education: N/A   Occupational History  . Not on file.   Social History Main Topics  . Smoking status: Never Smoker  . Smokeless tobacco: Never Used  . Alcohol use No  . Drug use: No  . Sexual activity: Yes   Other Topics Concern  . Not on file   Social History Narrative   Married.    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    Family History  Problem Relation Age of Onset  . Hypertension Mother   . Heart failure Mother   . Heart disease Mother   . Hypertension Father   . Diabetes Father   . Vision loss Father   . Hypertension Brother   . Hypertension Brother   . Hypertension Brother      Review of Systems:  See HPI for pertinent ROS. All other ROS negative.    Physical Exam: Blood pressure (!) 154/82, pulse  76, temperature 98 F (36.7 C), temperature source Oral, resp. rate 14, height 6\' 1"  (1.854 m), weight 107 kg (236 lb), SpO2 98 %., Body mass index is 31.14 kg/m. General: WNWD WM. Appears in no acute distress. Head: Normocephalic, atraumatic, eyes without discharge, sclera non-icteric, nares are without discharge. Bilateral auditory canals clear, TM's are without perforation, pearly grey and translucent with reflective cone of light bilaterally. Oral cavity moist, posterior pharynx without exudate, erythema, peritonsillar abscess, or post nasal  drip.  Neck: Supple. No thyromegaly. No lymphadenopathy.No carotid bruit. Lungs: Clear bilaterally to auscultation without wheezes, rales, or rhonchi. Breathing is unlabored. Heart: RRR with S1 S2. No murmurs, rubs, or gallops. Abdomen: Soft, non-tender, non-distended with normoactive bowel sounds. No hepatomegaly. No rebound/guarding. No obvious abdominal masses. Musculoskeletal:  Strength and tone normal for age. DRE: Deferred by patient Extremities/Skin: Warm and dry.  No edema. No rashes or suspicious lesions. Neuro: Alert and oriented X 3. Moves all extremities spontaneously. Gait is normal. CNII-XII grossly in tact. Psych:  Responds to questions appropriately with a normal affect.     ASSESSMENT AND PLAN:  56 y.o. year old male with    1. Encounter for preventive health examination  A. Screening Labs: He is fasting . - CBC with Differential/Platelet - COMPLETE METABOLIC PANEL WITH GFR - Lipid panel - TSH - PSA  B. Prostate cancer screening - PSA  C. Screening for colorectal cancer He had colonoscopy by Dr. Laural Golden 10/01/15. This revealed 1 polyp. Diverticulosis. External hemorrhoids. I did not see documented when he is to repeat this but patient states was told to repeat 7-10 years.  D. Immunizations: Flu: Agreeable to receive flu vaccine today. Flu vaccine given here 03/02/16, 03/08/2017 Tetanus: UTD-- States last tetanus was in  2012 Pneumonia vaccine--- he has no indication to require this until age 39 Shingrix----- Discussed this at [place] 03/08/17. I have put his name on the list of patients to call once we get in more of this vaccine. Also wrote this on his AVS and he is to check with his insurance to find out his coverage/cost.    Chronic constipation 03/08/2017: Continue to use the Linzess--low dose-- 72 g as needed. - linaclotide (LINZESS) 72 MCG capsule; Take 1 capsule (72 mcg total) by mouth daily before breakfast.  Dispense: 90 capsule; Refill: 3   2. HTN (hypertension 03/08/2017: Blood pressure is reading high today but he states that it usually is reading good at home. States that it is reading high today because he has not taken his morning dose yet.[facility]ology routinely.  3. Aortic root dilatation 03/08/2017: Managed by Cardiology   Signed, Karis Juba, [place], [facility] 03/08/2017 9:20 AM

## 2017-03-09 ENCOUNTER — Encounter: Payer: Self-pay | Admitting: *Deleted

## 2017-03-09 LAB — COMPLETE METABOLIC PANEL WITH GFR
AG Ratio: 2 (calc) (ref 1.0–2.5)
ALBUMIN MSPROF: 4.5 g/dL (ref 3.6–5.1)
ALKALINE PHOSPHATASE (APISO): 75 U/L (ref 40–115)
ALT: 11 U/L (ref 9–46)
AST: 12 U/L (ref 10–35)
BUN: 13 mg/dL (ref 7–25)
CALCIUM: 9.1 mg/dL (ref 8.6–10.3)
CO2: 25 mmol/L (ref 20–32)
CREATININE: 1.18 mg/dL (ref 0.70–1.33)
Chloride: 108 mmol/L (ref 98–110)
GFR, Est African American: 78 mL/min/{1.73_m2} (ref >=60)
GFR, Est Non African American: 68 mL/min/{1.73_m2} (ref >=60)
GLUCOSE: 114 mg/dL — AB (ref 65–99)
Globulin: 2.3 g/dL (calc) (ref 1.9–3.7)
Potassium: 4.3 mmol/L (ref 3.5–5.3)
Sodium: 143 mmol/L (ref 135–146)
Total Bilirubin: 0.5 mg/dL (ref 0.2–1.2)
Total Protein: 6.8 g/dL (ref 6.1–8.1)

## 2017-03-09 LAB — LIPID PANEL
CHOL/HDL RATIO: 3.5 (calc) (ref <5.0)
CHOLESTEROL: 153 mg/dL (ref <200)
HDL: 44 mg/dL (ref >40)
LDL CHOLESTEROL (CALC): 94 mg/dL
Non-HDL Cholesterol (Calc): 109 mg/dL (calc) (ref <130)
Triglycerides: 63 mg/dL (ref <150)

## 2017-03-09 LAB — CBC WITH DIFFERENTIAL/PLATELET
BASOS ABS: 51 {cells}/uL (ref 0–200)
BASOS PCT: 0.7 %
EOS PCT: 4.8 %
Eosinophils Absolute: 350 cells/uL (ref 15–500)
HCT: 44.3 % (ref 38.5–50.0)
HEMOGLOBIN: 15.2 g/dL (ref 13.2–17.1)
Lymphs Abs: 1504 cells/uL (ref 850–3900)
MCH: 29.2 pg (ref 27.0–33.0)
MCHC: 34.3 g/dL (ref 32.0–36.0)
MCV: 85 fL (ref 80.0–100.0)
MPV: 10.3 fL (ref 7.5–12.5)
Monocytes Relative: 7.2 %
NEUTROS ABS: 4869 {cells}/uL (ref 1500–7800)
Neutrophils Relative %: 66.7 %
Platelets: 288 10*3/uL (ref 140–400)
RBC: 5.21 10*6/uL (ref 4.20–5.80)
RDW: 13.2 % (ref 11.0–15.0)
Total Lymphocyte: 20.6 %
WBC mixed population: 526 cells/uL (ref 200–950)
WBC: 7.3 10*3/uL (ref 3.8–10.8)

## 2017-03-09 LAB — TSH: TSH: 2.3 mIU/L (ref 0.40–4.50)

## 2017-03-09 LAB — HEPATITIS C ANTIBODY
HEP C AB: NONREACTIVE
SIGNAL TO CUT-OFF: 0.01 (ref <1.00)

## 2017-03-09 LAB — HIV ANTIBODY (ROUTINE TESTING W REFLEX): HIV: NONREACTIVE

## 2017-03-09 LAB — PSA: PSA: 1 ng/mL (ref <=4.0)

## 2017-05-01 ENCOUNTER — Other Ambulatory Visit: Payer: Self-pay | Admitting: Cardiovascular Disease

## 2017-05-16 ENCOUNTER — Encounter: Payer: Self-pay | Admitting: Family Medicine

## 2017-05-31 ENCOUNTER — Encounter: Payer: Self-pay | Admitting: Family Medicine

## 2017-07-28 ENCOUNTER — Telehealth: Payer: Self-pay | Admitting: *Deleted

## 2017-07-28 NOTE — Telephone Encounter (Signed)
Per wife, patient c/o elevated BP. Before meds ranging 160/90 and after meds ranging 120/80. Per wife, last week while driving, patient's had hemorraging in right eye but patient did not see an eye doctor. Per wife, patient c/o chest & left arm pain 2 days ago and again on yesterday rated 5-6/10. Patient took aspirin 325 mg and felt relief. Per wife, patient c/o that today he is not feeling well with c/o elevated BP. Wife was unable to give BP reading from today or any details of today's symptoms. Wife advised that patient would be contacted directly to assess symptoms. Patient called at [phone] and [phone] and LMTCB.

## 2017-07-28 NOTE — Telephone Encounter (Signed)
Patient c/o fluctuating SBP ranging around 145-160's & lowest SBP 120. Patient said that he monitors his bp daily with his phone APP. Patient is taking his BP randomly all throughout the day. Patient does use the same arm and same monitor. Educated patient on monitoring BP no more than twice daily preferably an hour after he takes his medications and taking it at least 5-10 minutes after sitting. Patient advised to log these readings and take them to his next appointment. Patient said that when his BP is elevated he also has a bad headache and fatigue. Patient does not take anything for the headache. Patient denies having dizziness chest pain or sob. Patient advised that he could contact his PCP for an appointment and advised that if his symptoms got worse that he needed to go to the ED for an evaluation. Patient offered a Monday appointment with [facility] but patient declined stating he would be out of town. Patient given next first available appt for 08/18/17. Patient verbalized understanding of plan.

## 2017-07-31 ENCOUNTER — Ambulatory Visit: Payer: 59 | Admitting: Physician Assistant

## 2017-08-02 ENCOUNTER — Ambulatory Visit (INDEPENDENT_AMBULATORY_CARE_PROVIDER_SITE_OTHER): Payer: 59 | Admitting: Physician Assistant

## 2017-08-02 ENCOUNTER — Encounter: Payer: Self-pay | Admitting: Physician Assistant

## 2017-08-02 VITALS — BP 146/80 | HR 71 | Temp 97.8°F | Resp 16 | Wt 243.0 lb

## 2017-08-02 DIAGNOSIS — I10 Essential (primary) hypertension: Secondary | ICD-10-CM | POA: Diagnosis not present

## 2017-08-02 DIAGNOSIS — K5909 Other constipation: Secondary | ICD-10-CM

## 2017-08-02 DIAGNOSIS — G4733 Obstructive sleep apnea (adult) (pediatric): Secondary | ICD-10-CM | POA: Insufficient documentation

## 2017-08-02 DIAGNOSIS — I77810 Thoracic aortic ectasia: Secondary | ICD-10-CM | POA: Diagnosis not present

## 2017-08-02 MED ORDER — CLONIDINE HCL 0.1 MG PO TABS: 0.1000 mg | ORAL_TABLET | Freq: Two times a day (BID) | ORAL | 11 refills | Status: DC

## 2017-08-02 NOTE — Progress Notes (Signed)
Patient ID: George Pratt MRN: [id number], DOB: 1961-03-07, 57 y.o. Date of Encounter: @DATE @  Chief Complaint:  Chief Complaint  Patient presents with  . discuss blood pressure medicine    referral to another cardiologist   . Headache    HPI: 57 y.o. year old white male presents for CPE.   01/14/2015: presents to establish care.  He states that he has never had a primary care provider. Says that his father is a pediatrician who practiced in [place]. Says that he did not retire until age 69 and he is now 89. Says his father has just always treated any medical problems he has had, so that's why patient had not established primary care until now.  Says that he does see Cardiologist routinely.  Says that he sees an eye doctor once a year just for his eyeglasses. Says that he has no other problems with his eyes. No glaucoma etc.  Says that he sees no other medical providers.  Says that the only problem he has been having is constipation. Says he's been having issues with this for the past year. Uses over-the-counter laxitive as needed. Says uses this about once a week on average. However says that he has problems with constipation every day but he just waits until it gets really bad before he uses medication. No other complaints or concerns.   03/02/2016: Presents for CPE.  He has no complaints or concerns today. Reviewed that at last visit with me he had reported some constipation and I had prescribed Linzess. Today I was asked if he was using this medication. He states that he uses it occasionally. Is that he is using Dulcolax every other day. I asked why he is not using the [place]. He says that it causes too much diarrhea. Told him that since then they have come out with a new lower dose of Linzess and he is agreeable to try this. He has no complaints or specific concerns to address today. Says that he has been feeling okay.    03/08/2017: His wife accompanies him for visit  today. She also has an appointment with me today for CPE. Their daughter is now 47 years old and is in kindergarten. Husband adds that 2 of his grandchildren also live with them. Says that they have full custody of them. Says that they were taken from his daughter and so they have had full custody of them for years now. They say that they do let those children visit with their mother. Husband says that he "keeps thinking that they will get their act together, but then they don't, and it has been 6 years so he doesn't know that they will." They also add that since April, husband's 80 year old father has been living with them also. He is a retired Lexicographer. They state that things in their household are busy. Patient reports that he got a promotion at work and a new role at work. Now is a full-time professor and Nature conservation officer. Says that it actually is less stressful. 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 Says now he can actually just sit and take a break between his work. He has no concerns to address. States that he has been feeling good. States that he did not realize that he had an appointment scheduled with cardiology in September and missed that appointment but has rescheduled. I noted that his blood  pressure is reading a little high today. He says that that is because of the way he takes his medications and doses that through the day and has not taken his dose this morning because he is fasting. Says that his blood pressure usually runs good when he checks it. Also does have follow-up appointment with cardiology. Asked about his constipation and use of Linzess-  at last visit I gave him the lower dose--94mcg. He states that he uses this as needed, but not every day.    08/02/2017: He reports that over the last month his blood pressure "has been all over the place ".  Says that he just got 146/82 when he checked  it 2 hours ago and that here we are getting 146/80 so he knows he is getting accurate readings at home.  States that the prior reading before that was 162/88.  Says that he is never getting it less than 146 and it is mostly in the 150s.  Says that he called his Cardiologist office and they offered to see him Monday but his wife had an appointment that could not be rescheduled so they could not go at that time.  They then told him that their next available to see him was March 22.  He felt that he should not wait that long ---they told him he would have to go to the ER or see his PCP if he needed to see someone sooner. Therefore, came here.  Says that he feels bad when his blood pressure is running high and it concerns him for it to be running this high and he knows that he has a dilated aortic arch and knows that high blood pressure can make that worse.  Also states that he has a CPAP machine that is worn out.  Says he has had it since 2014.  He has had no evaluation regarding his sleep apnea since 2014.  Says that he has not been using the CPAP machine on a routine basis.  He only got it out recently because he was concerned that his sleep apnea may be contributing to his hypertension but then realized that the machine is worn out and will not even work. He is agreeable to go for a new sleep study to reevaluate at this time as it is been 5 years since the last evaluation.  No other concerns to address today.   Past Medical History:  Diagnosis Date  . Hypertension      Home Meds: Outpatient Medications Prior to Visit  Medication Sig Dispense Refill  . amLODipine (NORVASC) 10 MG tablet TAKE 1 TABLET BY MOUTH  DAILY 90 tablet 1  . aspirin EC 81 MG tablet Take 1 tablet (81 mg total) by mouth daily.    . hydrALAZINE (APRESOLINE) 50 MG tablet TAKE 1 TABLET BY MOUTH 3  TIMES DAILY 270 tablet 3  . labetalol (NORMODYNE) 100 MG tablet Take 1 tablet by mouth two  times daily 180 tablet 3  . linaclotide  (LINZESS) 72 MCG capsule Take 1 capsule (72 mcg total) by mouth daily before breakfast. (Patient taking differently: Take 72 mcg by mouth daily as needed. ) 90 capsule 0  . lisinopril (PRINIVIL,ZESTRIL) 40 MG tablet TAKE 1 TABLET BY MOUTH  DAILY 90 tablet 1  . Multiple Vitamins-Minerals (ONE-A-DAY MENS 50+ ADVANTAGE PO) Take 1 tablet by mouth daily.    . traMADol (ULTRAM) 50 MG tablet Take 1 tablet (50 mg total) by mouth every 6 (six) hours as  needed. 15 tablet 0  . labetalol (NORMODYNE) 100 MG tablet TAKE 1 TABLET BY MOUTH TWO  TIMES DAILY 180 tablet 1   No facility-administered medications prior to visit.     Allergies:  Allergies  Allergen Reactions  . Bee Venom Swelling    Social History   Socioeconomic History  . Marital status: Married    Spouse name: Not on file  . Number of children: Not on file  . Years of education: Not on file  . Highest education level: Not on file  Social Needs  . Financial resource strain: Not on file  . Food insecurity - worry: Not on file  . Food insecurity - inability: Not on file  . Transportation needs - medical: Not on file  . Transportation needs - non-medical: Not on file  Occupational History  . Not on file  Tobacco Use  . Smoking status: Never Smoker  . Smokeless tobacco: Never Used  Substance and Sexual Activity  . Alcohol use: No    Alcohol/week: 0.0 oz  . Drug use: No  . Sexual activity: Yes  Other Topics Concern  . Not on file  Social History Narrative   Married.    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    --Is available for students to call him, email him, etc   Says it is for a group of multiple colleges--in [facility] [employer] [facility], etc.    Family History  Problem Relation Age of Onset  . Hypertension Mother   . Heart failure Mother   . Heart disease Mother   . Hypertension Father   . Diabetes Father   . Vision loss Father   . Hypertension Brother   . Hypertension Brother   . Hypertension  Brother      Review of Systems:  See HPI for pertinent ROS. All other ROS negative.    Physical Exam: Blood pressure (!) 146/80, pulse 71, temperature 97.8 F (36.6 C), temperature source Oral, resp. rate 16, weight 110.2 kg (243 lb), SpO2 98 %., Body mass index is 32.06 kg/m. General: WNWD WM. Appears in no acute distress. Neck: Supple. No thyromegaly. No lymphadenopathy.No carotid bruit. Lungs: Clear bilaterally to auscultation without wheezes, rales, or rhonchi. Breathing is unlabored. Heart: RRR with S1 S2. No murmurs, rubs, or gallops. Abdomen: Soft, non-tender, non-distended with normoactive bowel sounds. No hepatomegaly. No rebound/guarding. No obvious abdominal masses. Musculoskeletal:  Strength and tone normal for age. DRE: Deferred by patient Extremities/Skin: Warm and dry.  No edema.  Neuro: Alert and oriented X 3. Moves all extremities spontaneously. Gait is normal. CNII-XII grossly in tact. Psych:  Responds to questions appropriately with a normal affect.     ASSESSMENT AND PLAN:  57 y.o. year old male with    HTN (hypertension), malignant He is to continue all of his current antihypertensives.  Already on max dose ACE inhibitor, Norvasc, labetalol, hydralazine. At this time will add clonidine 0.1 mg twice daily. I reviewed with him that this medication does not require lab monitoring and that he can monitor blood pressure at home rather than returning here to recheck it. He will follow-up if blood pressure is not controlled after adding this.  Otherwise will continue his follow-up with cardiology and routine follow-up here. - cloNIDine (CATAPRES) 0.1 MG tablet; Take 1 tablet (0.1 mg total) by mouth 2 (two) times daily.  Dispense: 60 tablet; Refill: 11    OSA (obstructive sleep apnea) Schedule an updated sleep study - Ambulatory  referral to Sleep Studies    Signed, Olean Ree [place], [place], [facility] 08/02/2017 11:47 AM

## 2017-08-14 ENCOUNTER — Encounter: Payer: Self-pay | Admitting: Physician Assistant

## 2017-08-14 ENCOUNTER — Telehealth: Payer: Self-pay

## 2017-08-14 ENCOUNTER — Ambulatory Visit: Payer: 59 | Admitting: Physician Assistant

## 2017-08-14 VITALS — BP 152/82 | HR 77 | Temp 97.8°F | Resp 14 | Wt 242.8 lb

## 2017-08-14 DIAGNOSIS — I10 Essential (primary) hypertension: Secondary | ICD-10-CM | POA: Diagnosis not present

## 2017-08-14 DIAGNOSIS — M545 Low back pain, unspecified: Secondary | ICD-10-CM

## 2017-08-14 MED ORDER — ZOSTER VAC RECOMB ADJUVANTED 50 MCG/0.5ML IM SUSR: 0.5000 mL | Freq: Once | INTRAMUSCULAR | 0 refills | Status: AC

## 2017-08-14 MED ORDER — CLONIDINE HCL 0.2 MG PO TABS: 0.2000 mg | ORAL_TABLET | Freq: Two times a day (BID) | ORAL | 11 refills | Status: DC

## 2017-08-14 MED ORDER — BACLOFEN 10 MG PO TABS: 10.0000 mg | ORAL_TABLET | Freq: Three times a day (TID) | ORAL | 1 refills | Status: DC

## 2017-08-14 NOTE — Telephone Encounter (Signed)
Our office no longer carries the shingrix vaccine.I will send over rx for pharmacy to give. Patient is aware

## 2017-08-14 NOTE — Progress Notes (Signed)
Patient ID: MONTE ZINNI MRN: [id number], DOB: [birth year], 57 y.o. Date of Encounter: @DATE @  Chief Complaint:  Chief Complaint  Patient presents with  . discuss blood pressure  . Back Pain    HPI: 57 y.o. year old white male presents for CPE.   01/14/2015: presents to establish care.  He states that he has never had a primary care provider. Says that his father is a pediatrician who practiced in [place]. Says that he did not retire until age 12 and he is now 50. Says his father has just always treated any medical problems he has had, so that's why patient had not established primary care until now.  Says that he does see Cardiologist routinely.  Says that he sees an eye doctor once a year just for his eyeglasses. Says that he has no other problems with his eyes. No glaucoma etc.  Says that he sees no other medical providers.  Says that the only problem he has been having is constipation. Says he's been having issues with this for the past year. Uses over-the-counter laxitive as needed. Says uses this about once a week on average. However says that he has problems with constipation every day but he just waits until it gets really bad before he uses medication. No other complaints or concerns.   03/02/2016: Presents for CPE.  He has no complaints or concerns today. Reviewed that at last visit with me he had reported some constipation and I had prescribed Linzess. Today I was asked if he was using this medication. He states that he uses it occasionally. Is that he is using Dulcolax every other day. I asked why he is not using the [place]. He says that it causes too much diarrhea. Told him that since then they have come out with a new lower dose of Linzess and he is agreeable to try this. He has no complaints or specific concerns to address today. Says that he has been feeling okay.    03/08/2017: His wife accompanies him for visit today. She also has an appointment with me today  for CPE. Their daughter is now 85 years old and is in kindergarten. Husband adds that 2 of his grandchildren also live with them. Says that they have full custody of them. Says that they were taken from his daughter and so they have had full custody of them for years now. They say that they do let those children visit with their mother. Husband says that he "keeps thinking that they will get their act together, but then they don't, and it has been 6 years so he doesn't know that they will." They also add that since April, husband's 50 year old father has been living with them also. He is a retired Lexicographer. They state that things in their household are busy. Patient reports that he got a promotion at work and a new role at work. Now is a full-time professor and Nature conservation officer. Says that it actually is less stressful. 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 Says now he can actually just sit and take a break between his work. He has no concerns to address. States that he has been feeling good. States that he did not realize that he had an appointment scheduled with cardiology in September and missed that appointment but has rescheduled. I noted that his blood pressure is reading a little high today. He  says that that is because of the way he takes his medications and doses that through the day and has not taken his dose this morning because he is fasting. Says that his blood pressure usually runs good when he checks it. Also does have follow-up appointment with cardiology. Asked about his constipation and use of Linzess-  at last visit I gave him the lower dose--36mcg. He states that he uses this as needed, but not every day.    08/02/2017: He reports that over the last month his blood pressure "has been all over the place ".  Says that he just got 146/82 when he checked it 2 hours ago and that here we are getting  146/80 so he knows he is getting accurate readings at home.  States that the prior reading before that was 162/88.  Says that he is never getting it less than 146 and it is mostly in the 150s.  Says that he called his Cardiologist office and they offered to see him Monday but his wife had an appointment that could not be rescheduled so they could not go at that time.  They then told him that their next available to see him was March 22.  He felt that he should not wait that long ---they told him he would have to go to the ER or see his PCP if he needed to see someone sooner. Therefore, came here.  Says that he feels bad when his blood pressure is running high and it concerns him for it to be running this high and he knows that he has a dilated aortic arch and knows that high blood pressure can make that worse.  Also states that he has a CPAP machine that is worn out.  Says he has had it since 2014.  He has had no evaluation regarding his sleep apnea since 2014.  Says that he has not been using the CPAP machine on a routine basis.  He only got it out recently because he was concerned that his sleep apnea may be contributing to his hypertension but then realized that the machine is worn out and will not even work. He is agreeable to go for a new sleep study to reevaluate at this time as it is been 5 years since the last evaluation.  No other concerns to address today.  A/P AT THAT OV 08/02/2017: HTN (hypertension), malignant He is to continue all of his current antihypertensives.  Already on max dose ACE inhibitor, Norvasc, labetalol, hydralazine. At this time will add clonidine 0.1 mg twice daily. I reviewed with him that this medication does not require lab monitoring and that he can monitor blood pressure at home rather than returning here to recheck it. He will follow-up if blood pressure is not controlled after adding this.  Otherwise will continue his follow-up with cardiology and routine follow-up  here. - cloNIDine (CATAPRES) 0.1 MG tablet; Take 1 tablet (0.1 mg total) by mouth 2 (two) times daily.  Dispense: 60 tablet; Refill: 11  OSA (obstructive sleep apnea) Schedule an updated sleep study - Ambulatory referral to Sleep Studies    08/14/2017: Today he reports that he did add the clonidine 0.1 mg twice daily.  However continues to get similar blood pressure readings.  He feels that they are "all over the place" "and higher than they should be ".   He reads off recent blood pressure readings--  he has been keeping them logged in his cell phone.   March  7 at 12 AM --137/75.   At 8 AM was 149/89.   March 8 at 10 AM which was 2 hours after taking medicine 132/76.   March 8 at 9:44 PM was 153/82.   Marrch 9th at 3:05 PM which was 7 hours after taking medicine 140/80.   March 11th was 138/81.   March13th--- 159/86.  States that that was prior to taking morning medicines.   March 16th 120/65.  Also reports that he recently had some back pain and spasm in his back.  States that his 42 year old father lives with them.  They had to take him down to [place].  He "is like a dead weight that does not help. He does not use his muscles to help".  Says that he had to help get him in the [place] and out of the [place].  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  Says he has degenerative disc disease in his low back.  Says that in the past he has had Flexeril, baclofen to use for spasms.  Thinks that in the past the baclofen worked better for him.  However, he is out of these and has no muscle relaxers to use so he took some ibuprofen yesterday and today.  The symptoms are improved and currently are stable but would like to have some muscle relaxer available to use for future.  States that "those muscle spasms are rough when they hit ".  He is having no pain, no weakness, no tingling, or numbness down neither leg or  foot.     Past Medical History:  Diagnosis Date  . Hypertension      Home Meds: Outpatient Medications Prior to Visit  Medication Sig Dispense Refill  . amLODipine (NORVASC) 10 MG tablet TAKE 1 TABLET BY MOUTH  DAILY 90 tablet 1  . aspirin EC 81 MG tablet Take 1 tablet (81 mg total) by mouth daily.    . hydrALAZINE (APRESOLINE) 50 MG tablet TAKE 1 TABLET BY MOUTH 3  TIMES DAILY 270 tablet 3  . labetalol (NORMODYNE) 100 MG tablet Take 1 tablet by mouth two  times daily 180 tablet 3  . linaclotide (LINZESS) 72 MCG capsule Take 1 capsule (72 mcg total) by mouth daily before breakfast. (Patient taking differently: Take 72 mcg by mouth daily as needed. ) 90 capsule 0  . lisinopril (PRINIVIL,ZESTRIL) 40 MG tablet TAKE 1 TABLET BY MOUTH  DAILY 90 tablet 1  . Multiple Vitamins-Minerals (ONE-A-DAY MENS 50+ ADVANTAGE PO) Take 1 tablet by mouth daily.    . traMADol (ULTRAM) 50 MG tablet Take 1 tablet (50 mg total) by mouth every 6 (six) hours as needed. 15 tablet 0  . cloNIDine (CATAPRES) 0.1 MG tablet Take 1 tablet (0.1 mg total) by mouth 2 (two) times daily. 60 tablet 11   No facility-administered medications prior to visit.     Allergies:  Allergies  Allergen Reactions  . Bee Venom Swelling    Social History   Socioeconomic History  . Marital status: Married    Spouse name: Not on file  . Number of children: Not on file  . Years of education: Not on file  . Highest education level: Not on file  Social Needs  . Financial resource strain: Not on file  . Food insecurity - worry: Not on file  . Food insecurity - inability: Not on file  .  Transportation needs - medical: Not on file  . Transportation needs - non-medical: Not on file  Occupational History  . Not on file  Tobacco Use  . Smoking status: Never Smoker  . Smokeless tobacco: Never Used  Substance and Sexual Activity  . Alcohol use: No    Alcohol/week: 0.0 oz  . Drug use: No  . Sexual activity: Yes  Other Topics  Concern  . Not on file  Social History Narrative   Married.    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    Family History  Problem Relation Age of Onset  . Hypertension Mother   . Heart failure Mother   . Heart disease Mother   . Hypertension Father   . Diabetes Father   . Vision loss Father   . Hypertension Brother   . Hypertension Brother   . Hypertension Brother      Review of Systems:  See HPI for pertinent ROS. All other ROS negative.    Physical Exam: Blood pressure (!) 152/82, pulse 77, temperature 97.8 F (36.6 C), temperature source Oral, resp. rate 14, weight 110.1 kg (242 lb 12.8 oz), SpO2 98 %., Body mass index is 32.03 kg/m. General: WNWD WM. Appears in no acute distress. Neck: Supple. No thyromegaly. No lymphadenopathy.No carotid bruit. Lungs: Clear bilaterally to auscultation without wheezes, rales, or rhonchi. Breathing is unlabored. Heart: RRR with S1 S2. No murmurs, rubs, or gallops. Abdomen: Soft, non-tender, non-distended with normoactive bowel sounds. No hepatomegaly. No rebound/guarding. No obvious abdominal masses. Musculoskeletal:  Strength and tone normal for age. Extremities/Skin: Warm and dry.  No edema.  Neuro: Alert and oriented X 3. Moves all extremities spontaneously. Gait is normal. CNII-XII grossly in tact. Psych:  Responds to questions appropriately with a normal affect.     ASSESSMENT AND PLAN:  57 y.o. year old male with    1. Essential hypertension 08/14/2017: Increase clonidine to 0.2 mg twice daily.  He already has a follow-up appointment scheduled with cardiology in 1 week.  He will follow-up with that visit.  Follow-up sooner if needed. - cloNIDine (CATAPRES) 0.2 MG tablet; Take 1 tablet (0.2 mg total) by mouth 2 (two) times daily.  Dispense: 60 tablet; Refill: 11  2.  Acute bilateral low back pain without sciatica - baclofen (LIORESAL) 10 MG tablet; Take 1 tablet (10 mg total) by mouth 3 (three) times daily.  Dispense: 90 tablet; Refill: 1    Signed, [address] [place], [place], [facility] 08/14/2017 3:57 PM

## 2017-08-17 NOTE — Progress Notes (Signed)
Cardiology Office Note    Date:  08/19/2017   ID:  George, Pratt 1960-10-01, MRN [id number]  PCP:  Orlena Sheldon, PA-C  Cardiologist: Kate Sable, MD    Chief Complaint  Patient presents with  . Annual Exam    elevated BP    History of Present Illness:    George Pratt is a 57 y.o. male with past medical history of HTN and mild thoracic aortic dilation who presents to the office today for overdue annual follow-up.   He was last examined by Jory Sims, DNP in 01/2016 for follow-up from a recent ED evaluation for chest pain which was thought to be atypical for a cardiac etiology and most consistent with MSK pain. He denied any recent dyspnea or palpitations. He was continued on his current medication regimen of Amlodipine 10mg  daily, Hydralazine 50mg  TID, Labetalol 100mg  BID, and Lisinopril 40mg  daily.   He was recently evaluated by his PCP on 08/02/2017 for elevated BP which was at 146/80 at the time of his visit. He was started on Clonidine 0.1 mg twice daily. At the time of his follow-up visit on 08/14/2017 BP was still elevated at 152/82, therefore Clonidine was further titrated to 0.2 mg twice daily.  In talking with the patient today, he brings with him a very detailed blood pressure log on his phone which shows SBP has been in the 160's-170's when checked in the early morning hours. This typically improves into the 120's-140's after he takes his morning medications. In reviewing the time he takes medicines, he has been taking Hydralazine at 9 AM, noon, and 4 PM while taking Labetalol at 9 AM and 4 PM. He takes his Amlodipine and Lisinopril in the morning hours. Since being started on Clonidine, he has noted worsening fatigue and headaches.  He reports having worsening dyspnea on exertion over the past few months and equates this to over 20 pound weight gain due to his sedentary job. He denies any episodes of chest pain. No recent orthopnea, PND, or palpitations. Does  have occasional lower extremity edema.    Past Medical History:  Diagnosis Date  . Hypertension     Past Surgical History:  Procedure Laterality Date  . APPENDECTOMY  2006  . COLONOSCOPY N/A 10/01/2015   Procedure: COLONOSCOPY;  Surgeon: Rogene Houston, MD;  Location: [facility];  Service: Endoscopy;  Laterality: N/A;  1030  . KNEE SURGERY    . NECK SURGERY  2000    Current Medications: Outpatient Medications Prior to Visit  Medication Sig Dispense Refill  . amLODipine (NORVASC) 10 MG tablet TAKE 1 TABLET BY MOUTH  DAILY 90 tablet 1  . aspirin EC 81 MG tablet Take 1 tablet (81 mg total) by mouth daily.    . baclofen (LIORESAL) 10 MG tablet Take 1 tablet (10 mg total) by mouth 3 (three) times daily. 90 tablet 1  . labetalol (NORMODYNE) 100 MG tablet Take 1 tablet by mouth two  times daily 180 tablet 3  . linaclotide (LINZESS) 72 MCG capsule Take 1 capsule (72 mcg total) by mouth daily before breakfast. (Patient taking differently: Take 72 mcg by mouth daily as needed. ) 90 capsule 0  . lisinopril (PRINIVIL,ZESTRIL) 40 MG tablet TAKE 1 TABLET BY MOUTH  DAILY 90 tablet 1  . Multiple Vitamins-Minerals (ONE-A-DAY MENS 50+ ADVANTAGE PO) Take 1 tablet by mouth daily.    . traMADol (ULTRAM) 50 MG tablet Take 1 tablet (50 mg total) by mouth  every 6 (six) hours as needed. 15 tablet 0  . cloNIDine (CATAPRES) 0.2 MG tablet Take 1 tablet (0.2 mg total) by mouth 2 (two) times daily. 60 tablet 11  . hydrALAZINE (APRESOLINE) 50 MG tablet TAKE 1 TABLET BY MOUTH 3  TIMES DAILY 270 tablet 3   No facility-administered medications prior to visit.      Allergies:   Bee venom   Social History   Socioeconomic History  . Marital status: Married    Spouse name: Not on file  . Number of children: Not on file  . Years of education: Not on file  . Highest education level: Not on file  Occupational History  . Not on file  Social Needs  . Financial resource strain: Not on file  . Food insecurity:      Worry: Not on file    Inability: Not on file  . Transportation needs:    Medical: Not on file    Non-medical: Not on file  Tobacco Use  . Smoking status: Never Smoker  . Smokeless tobacco: Never Used  Substance and Sexual Activity  . Alcohol use: No    Alcohol/week: 0.0 oz  . Drug use: No  . Sexual activity: Yes  Lifestyle  . Physical activity:    Days per week: Not on file    Minutes per session: Not on file  . Stress: Not on file  Relationships  . Social connections:    Talks on phone: Not on file    Gets together: Not on file    Attends religious service: Not on file    Active member of club or organization: Not on file    Attends meetings of clubs or organizations: Not on file    Relationship status: Not on file  Other Topics Concern  . Not on file  Social History Narrative   Married.    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    --Is available for students to call him, email him, etc   Says it is for a group of multiple colleges--in [facility], etc.     Family History:  The patient's family history includes Diabetes in his father; Heart disease in his mother; Heart failure in his mother; Hypertension in his brother, brother, brother, father, and mother; Vision loss in his father.   Review of Systems:   Please see the history of present illness.     General:  No chills, fever, night sweats or weight changes. Positive for headaches Cardiovascular:  No chest pain, edema, orthopnea, palpitations, paroxysmal nocturnal dyspnea. Positive for dyspnea on exertion.  Dermatological: No rash, lesions/masses Respiratory: No cough, dyspnea Urologic: No hematuria, dysuria Abdominal:   No nausea, vomiting, diarrhea, bright red blood per rectum, melena, or hematemesis Neurologic:  No visual changes, wkns, changes in mental status.  All other systems reviewed and are otherwise negative except as noted above.   Physical Exam:    VS:  BP  (!) 156/90   Pulse 79   Ht 6\' 1"  (1.854 m)   Wt 245 lb 6.4 oz (111.3 kg)   SpO2 96% Comment: on room air  BMI 32.38 kg/m    General: Well developed, well nourished Caucasian male appearing in no acute distress. Head: Normocephalic, atraumatic, sclera non-icteric, no xanthomas, nares are without discharge.  Neck: No carotid bruits. JVD not elevated.  Lungs: Respirations regular and unlabored, without wheezes or rales.  Heart: Regular rate and rhythm. No S3 or S4.  No murmur, no rubs, or gallops appreciated. Abdomen: Soft, non-tender, non-distended with normoactive bowel sounds. No hepatomegaly. No rebound/guarding. No obvious abdominal masses. Msk:  Strength and tone appear normal for age. No joint deformities or effusions. Extremities: No clubbing or cyanosis. No lower extremity edema.  Distal pedal pulses are 2+ bilaterally. Neuro: Alert and oriented X 3. Moves all extremities spontaneously. No focal deficits noted. Psych:  Responds to questions appropriately with a normal affect. Skin: No rashes or lesions noted  Wt Readings from Last 3 Encounters:  08/18/17 245 lb 6.4 oz (111.3 kg)  08/14/17 242 lb 12.8 oz (110.1 kg)  08/02/17 243 lb (110.2 kg)     Studies/Labs Reviewed:   EKG:  EKG is ordered today.  The ekg ordered today demonstrates NSR, HR 76, with borderline LVH. No acute ST or T-wave changes when compared to prior tracings.   Recent Labs: 03/08/2017: ALT 11; BUN 13; Creat 1.18; Hemoglobin 15.2; Platelets 288; Potassium 4.3; Sodium 143; TSH 2.30   Lipid Panel    Component Value Date/Time   CHOL 153 03/08/2017 0939   TRIG 63 03/08/2017 0939   HDL 44 03/08/2017 0939   CHOLHDL 3.5 03/08/2017 0939   VLDL 13 03/03/2016 0912   LDLCALC 94 03/08/2017 0939    Additional studies/ records that were reviewed today include:   Echocardiogram: 02/2014 Study Conclusions  - Procedure narrative: Transthoracic echocardiography. Image quality was suboptimal. The study was  technically difficult, as a result of poor sound wave transmission. - Left ventricle: The cavity size was normal. Systolic function was normal. The estimated ejection fraction was in the range of 60% to 65%. Wall motion was normal; there were no regional wall motion abnormalities. Doppler parameters are consistent with abnormal left ventricular relaxation (grade 1 diastolic dysfunction). Mild to moderate concentric LVH. - Aorta: Mild aortic root dilatation. Aortic root dimension: 44 mm (ED).  Assessment:    1. Essential hypertension   2. Dyspnea on exertion   3. Aortic root dilatation (HCC)      Plan:   In order of problems listed above:  1. Essential HTN - he has been on Amlodipine 10mg  daily, Hydralazine 50mg  TID, Labetalol 100mg  BID, and Lisinopril 40mg  daily for several years with Clonidine recently being added due to his elevated BP. He reports worsening headaches and fatigue with the addition of this. In reviewing the schedule at which time he takes his medications, I am concerned that his elevated BP readings in the morning are secondary to him taking his BID and TID medications too close together as outlined above. We discussed spreading out his twice daily medications to 12 hours apart and Hydralazine as close to 8 hours apart as possible. He has only been on Clonidine 0.2 mg BID for 3 days. Will wean back down to previous dosing of 0.1 mg twice daily. Will further titrate Hydralazine to 75 mg TID. He should continue to follow blood pressure daily and report back with readings within the next few weeks. If BP has improved at that time, would like to stop Clonidine altogether due to his side-effects. Can further titrate Hydralazine to 100 mg TID if needed or titrate Labetalol.  2. Dyspnea on Exertion - reports having worsening dyspnea on exertion over the past few months and equates this to over 20 pound weight gain due to his sedentary job. He denies any episodes of  chest pain. Does have occasional lower extremity edema.  - We discussed further evaluation with at least an echocardiogram in  the setting of his known dilated thoracic aorta. He declines at this time as he feels like symptoms are largely secondary to his variable BP.  Will reassess at follow-up.  3. Dilation of Thoracic Aorta - Aortic root dimension at 44 mm by most recent echo in 2015.  Plan for repeat evaluation as outlined above.   Medication Adjustments/Labs and Tests Ordered: Current medicines are reviewed at length with the patient today.  Concerns regarding medicines are outlined above.  Medication changes, Labs and Tests ordered today are listed in the Patient Instructions below. Patient Instructions  Medication Instructions:  Your physician has recommended you make the following change in your medication:  Decrease Clonidine to 0.1 mg Two Times Daily  Increase Hydralazine to 75 mg Three Times Daily   Labwork: NONE   Testing/Procedures: NONE   Follow-Up: Your physician recommends that you schedule a follow-up appointment in: 4 Weeks   Any Other Special Instructions Will Be Listed Below (If Applicable). Your physician has requested that you regularly monitor and record your blood pressure readings at home. Please use the same machine at the same time of day to check your readings and record them to bring to your follow-up visit.   If you need a refill on your cardiac medications before your next appointment, please call your pharmacy.  Thank you for choosing [facility]!    Signed, Erma Heritage, PA-C  08/19/2017 7:40 AM    [place] [address] [place], [place] [address] Phone: [phone]

## 2017-08-18 ENCOUNTER — Ambulatory Visit (INDEPENDENT_AMBULATORY_CARE_PROVIDER_SITE_OTHER): Payer: 59 | Admitting: Student

## 2017-08-18 ENCOUNTER — Encounter: Payer: Self-pay | Admitting: Student

## 2017-08-18 VITALS — BP 156/90 | HR 79 | Ht 73.0 in | Wt 245.4 lb

## 2017-08-18 DIAGNOSIS — I10 Essential (primary) hypertension: Secondary | ICD-10-CM

## 2017-08-18 DIAGNOSIS — R0609 Other forms of dyspnea: Secondary | ICD-10-CM

## 2017-08-18 DIAGNOSIS — I77810 Thoracic aortic ectasia: Secondary | ICD-10-CM | POA: Diagnosis not present

## 2017-08-18 DIAGNOSIS — R0600 Dyspnea, unspecified: Secondary | ICD-10-CM

## 2017-08-18 MED ORDER — CLONIDINE HCL 0.1 MG PO TABS: 0.1000 mg | ORAL_TABLET | Freq: Three times a day (TID) | ORAL | 11 refills | Status: DC

## 2017-08-18 MED ORDER — HYDRALAZINE HCL 50 MG PO TABS: 75.0000 mg | ORAL_TABLET | Freq: Three times a day (TID) | ORAL | 3 refills | Status: DC

## 2017-08-18 NOTE — Patient Instructions (Signed)
Medication Instructions:  Your physician has recommended you make the following change in your medication:  Decrease Clonidine to 0.1 mg Two Times Daily  Increase Hydralazine to 75 mg Three Times Daily    Labwork: NONE   Testing/Procedures: NONE   Follow-Up: Your physician recommends that you schedule a follow-up appointment in: 4 Weeks   Any Other Special Instructions Will Be Listed Below (If Applicable). Your physician has requested that you regularly monitor and record your blood pressure readings at home. Please use the same machine at the same time of day to check your readings and record them to bring to your follow-up visit.   If you need a refill on your cardiac medications before your next appointment, please call your pharmacy.  Thank you for choosing [facility]!

## 2017-08-29 ENCOUNTER — Encounter: Payer: Self-pay | Admitting: Family Medicine

## 2017-08-29 ENCOUNTER — Encounter: Payer: Self-pay | Admitting: Physician Assistant

## 2017-08-29 ENCOUNTER — Other Ambulatory Visit: Payer: Self-pay

## 2017-08-29 ENCOUNTER — Ambulatory Visit (INDEPENDENT_AMBULATORY_CARE_PROVIDER_SITE_OTHER): Payer: 59 | Admitting: Family Medicine

## 2017-08-29 ENCOUNTER — Ambulatory Visit (HOSPITAL_COMMUNITY)
Admission: RE | Admit: 2017-08-29 | Discharge: 2017-08-29 | Disposition: A | Discharge status: Home / Self Care | Payer: 59 | Source: Ambulatory Visit | Attending: Family Medicine | Admitting: Family Medicine

## 2017-08-29 VITALS — BP 208/110 | HR 119 | Temp 101.1°F | Wt 238.1 lb

## 2017-08-29 DIAGNOSIS — R509 Fever, unspecified: Secondary | ICD-10-CM | POA: Insufficient documentation

## 2017-08-29 DIAGNOSIS — R000 Tachycardia, unspecified: Secondary | ICD-10-CM | POA: Diagnosis not present

## 2017-08-29 DIAGNOSIS — J189 Pneumonia, unspecified organism: Secondary | ICD-10-CM | POA: Diagnosis not present

## 2017-08-29 DIAGNOSIS — I160 Hypertensive urgency: Secondary | ICD-10-CM

## 2017-08-29 DIAGNOSIS — R05 Cough: Secondary | ICD-10-CM

## 2017-08-29 DIAGNOSIS — R059 Cough, unspecified: Secondary | ICD-10-CM

## 2017-08-29 LAB — INFLUENZA A AND B AG, IMMUNOASSAY
INFLUENZA A ANTIGEN: NOT DETECTED
INFLUENZA B ANTIGEN: NOT DETECTED

## 2017-08-29 MED ORDER — AZITHROMYCIN 250 MG PO TABS
ORAL_TABLET | ORAL | 0 refills | Status: DC
Start: 2017-08-29 — End: 2017-09-28

## 2017-08-29 MED ORDER — BENZONATATE 100 MG PO CAPS: 100.0000 mg | ORAL_CAPSULE | Freq: Three times a day (TID) | ORAL | 0 refills | Status: DC | PRN

## 2017-08-29 MED ORDER — IPRATROPIUM-ALBUTEROL 0.5-2.5 (3) MG/3ML IN SOLN
3.0000 mL | Freq: Once | RESPIRATORY_TRACT | Status: AC
  Administered 2017-08-29: 3 mL via RESPIRATORY_TRACT

## 2017-08-29 MED ORDER — ALBUTEROL SULFATE (2.5 MG/3ML) 0.083% IN NEBU
2.5000 mg | INHALATION_SOLUTION | Freq: Once | RESPIRATORY_TRACT | Status: AC
  Administered 2017-08-29: 2.5 mg via RESPIRATORY_TRACT

## 2017-08-29 MED ORDER — ALBUTEROL SULFATE HFA 108 (90 BASE) MCG/ACT IN AERS: 2.0000 | INHALATION_SPRAY | Freq: Four times a day (QID) | RESPIRATORY_TRACT | 0 refills | Status: DC | PRN

## 2017-08-29 NOTE — Progress Notes (Signed)
Sinus tachycardia, LVH, no evidence of ischemia or infarction, no ST segment elevation or depression

## 2017-08-29 NOTE — Patient Instructions (Signed)
Community-Acquired Pneumonia, Adult Pneumonia is an infection of the lungs. One type of pneumonia can happen while a person is in a hospital. A different type can happen when a person is not in a hospital (community-acquired pneumonia). It is easy for this kind to spread from person to person. It can spread to you if you breathe near an infected person who coughs or sneezes. Some symptoms include:  A dry cough.  A wet (productive) cough.  Fever.  Sweating.  Chest pain.  Follow these instructions at home:  Take over-the-counter and prescription medicines only as told by your doctor. ? Only take cough medicine if you are losing sleep. ? If you were prescribed an antibiotic medicine, take it as told by your doctor. Do not stop taking the antibiotic even if you start to feel better.  Sleep with your head and neck raised (elevated). You can do this by putting a few pillows under your head, or you can sleep in a recliner.  Do not use tobacco products. These include cigarettes, chewing tobacco, and e-cigarettes. If you need help quitting, ask your doctor.  Drink enough water to keep your pee (urine) clear or pale yellow. A shot (vaccine) can help prevent pneumonia. Shots are often suggested for:  People older than 59 years of age.  People older than 59 years of age: ? Who are having cancer treatment. ? Who have long-term (chronic) lung disease. ? Who have problems with their body's defense system (immune system).  You may also prevent pneumonia if you take these actions:  Get the flu (influenza) shot every year.  Go to the dentist as often as told.  Wash your hands often. If soap and water are not available, use hand sanitizer.  Contact a doctor if:  You have a fever.  You lose sleep because your cough medicine does not help. Get help right away if:  You are short of breath and it gets worse.  You have more chest pain.  Your sickness gets worse. This is very serious  if: ? You are an older adult. ? Your body's defense system is weak.  You cough up blood. This information is not intended to replace advice given to you by your health care provider. Make sure you discuss any questions you have with your health care provider. Document Released: 11/02/2007 Document Revised: 10/22/2015 Document Reviewed: 09/10/2014 Elsevier Interactive Patient Education  2018 [employer].    Hypertension Hypertension, commonly called high blood pressure, is when the force of blood pumping through the arteries is too strong. The arteries are the blood vessels that carry blood from the heart throughout the body. Hypertension forces the heart to work harder to pump blood and may cause arteries to become narrow or stiff. Having untreated or uncontrolled hypertension can cause heart attacks, strokes, kidney disease, and other problems. A blood pressure reading consists of a higher number over a lower number. Ideally, your blood pressure should be below 120/80. The first ("top") number is called the systolic pressure. It is a measure of the pressure in your arteries as your heart beats. The second ("bottom") number is called the diastolic pressure. It is a measure of the pressure in your arteries as the heart relaxes. What are the causes? The cause of this condition is not known. What increases the risk? Some risk factors for high blood pressure are under your control. Others are not. Factors you can change Smoking. Having type 2 diabetes mellitus, high cholesterol, or both. Not getting  enough exercise or physical activity. Being overweight. Having too much fat, sugar, calories, or salt (sodium) in your diet. Drinking too much alcohol. Factors that are difficult or impossible to change Having chronic kidney disease. Having a family history of high blood pressure. Age. Risk increases with age. Race. You may be at higher risk if you are African-American. Gender. Men are at  higher risk than women before age 86. After age 78, women are at higher risk than men. Having obstructive sleep apnea. Stress. What are the signs or symptoms? Extremely high blood pressure (hypertensive crisis) may cause: Headache. Anxiety. Shortness of breath. Nosebleed. Nausea and vomiting. Severe chest pain. Jerky movements you cannot control (seizures).  How is this diagnosed? This condition is diagnosed by measuring your blood pressure while you are seated, with your arm resting on a surface. The cuff of the blood pressure monitor will be placed directly against the skin of your upper arm at the level of your heart. It should be measured at least twice using the same arm. Certain conditions can cause a difference in blood pressure between your right and left arms. Certain factors can cause blood pressure readings to be lower or higher than normal (elevated) for a short period of time: When your blood pressure is higher when you are in a health care provider's office than when you are at home, this is called white coat hypertension. Most people with this condition do not need medicines. When your blood pressure is higher at home than when you are in a health care provider's office, this is called masked hypertension. Most people with this condition may need medicines to control blood pressure.  If you have a high blood pressure reading during one visit or you have normal blood pressure with other risk factors: You may be asked to return on a different day to have your blood pressure checked again. You may be asked to monitor your blood pressure at home for 1 week or longer.  If you are diagnosed with hypertension, you may have other blood or imaging tests to help your health care provider understand your overall risk for other conditions. How is this treated? This condition is treated by making healthy lifestyle changes, such as eating healthy foods, exercising more, and reducing your  alcohol intake. Your health care provider may prescribe medicine if lifestyle changes are not enough to get your blood pressure under control, and if: Your systolic blood pressure is above 130. Your diastolic blood pressure is above 80.  Your personal target blood pressure may vary depending on your medical conditions, your age, and other factors. Follow these instructions at home: Eating and drinking Eat a diet that is high in fiber and potassium, and low in sodium, added sugar, and fat. An example eating plan is called the DASH (Dietary Approaches to Stop Hypertension) diet. To eat this way: Eat plenty of fresh fruits and vegetables. Try to fill half of your plate at each meal with fruits and vegetables. Eat whole grains, such as whole wheat pasta, brown rice, or whole grain bread. Fill about one quarter of your plate with whole grains. Eat or drink low-fat dairy products, such as skim milk or low-fat yogurt. Avoid fatty cuts of meat, processed or cured meats, and poultry with skin. Fill about one quarter of your plate with lean proteins, such as fish, chicken without skin, beans, eggs, and tofu. Avoid premade and processed foods. These tend to be higher in sodium, added sugar, and fat. Reduce  your daily sodium intake. Most people with hypertension should eat less than 1,500 mg of sodium a day. Limit alcohol intake to no more than 1 drink a day for nonpregnant women and 2 drinks a day for men. One drink equals 12 oz of beer, 5 oz of wine, or 1 oz of hard liquor. Lifestyle Work with your health care provider to maintain a healthy body weight or to lose weight. Ask what an ideal weight is for you. Get at least 30 minutes of exercise that causes your heart to beat faster (aerobic exercise) most days of the week. Activities may include walking, swimming, or biking. Include exercise to strengthen your muscles (resistance exercise), such as pilates or lifting weights, as part of your weekly exercise  routine. Try to do these types of exercises for 30 minutes at least 3 days a week. Do not use any products that contain nicotine or tobacco, such as cigarettes and e-cigarettes. If you need help quitting, ask your health care provider. Monitor your blood pressure at home as told by your health care provider. Keep all follow-up visits as told by your health care provider. This is important. Medicines Take over-the-counter and prescription medicines only as told by your health care provider. Follow directions carefully. Blood pressure medicines must be taken as prescribed. Do not skip doses of blood pressure medicine. Doing this puts you at risk for problems and can make the medicine less effective. Ask your health care provider about side effects or reactions to medicines that you should watch for. Contact a health care provider if: You think you are having a reaction to a medicine you are taking. You have headaches that keep coming back (recurring). You feel dizzy. You have swelling in your ankles. You have trouble with your vision. Get help right away if: You develop a severe headache or confusion. You have unusual weakness or numbness. You feel faint. You have severe pain in your chest or abdomen. You vomit repeatedly. You have trouble breathing. Summary Hypertension is when the force of blood pumping through your arteries is too strong. If this condition is not controlled, it may put you at risk for serious complications. Your personal target blood pressure may vary depending on your medical conditions, your age, and other factors. For most people, a normal blood pressure is less than 120/80. Hypertension is treated with lifestyle changes, medicines, or a combination of both. Lifestyle changes include weight loss, eating a healthy, low-sodium diet, exercising more, and limiting alcohol. This information is not intended to replace advice given to you by your health care provider. Make sure  you discuss any questions you have with your health care provider. Document Released: 05/16/2005 Document Revised: 04/13/2016 Document Reviewed: 04/13/2016 Elsevier Interactive Patient Education  [employer].

## 2017-08-29 NOTE — Progress Notes (Signed)
Patient ID: George Pratt, male    DOB: 05/23/1961, 57 y.o.   MRN: [id number]  PCP: Orlena Sheldon, PA-C  Chief Complaint  Patient presents with  . URI    Patient in today with c/o eyes matting, productive cough with green mucus,chest tightness, hoarse voice, runny nose, and night     Subjective:   George Pratt is a 57 y.o. male, with PMH of hypertension, aortic root dilatation, obstructive sleep apnea, chronic constipation, presents to clinic with 2 weeks of worsening productive cough with green sputum, chest tightness, nasal congestion, sore throat, fever with T-max of 102.  When endorses shortness of breath associated with coughing fits which are particularly worse at night and when lying down, also endorses near syncope with coughing fits.  Associated exertional dyspnea with mild activity, fatigue, decreased appetite, 3 pound weight loss, and profuse night sweats.  He has had no improvement with NyQuil, Robitussin, Benadryl and Mucinex.  While taking these over-the-counter medications the patient held all of his blood pressure medication and upon presentation today, blood pressure is elevated to 208/110.  Pt denies CP, chest pressure, syncope, HA, confusion, LE edema, decreased urine output. Pt has anterior chest wall pain occurs with palpation or coughing. He denies any headache, back pain, syncope, palpitations, numbness.  He does feel like he wakes up choking, and he attributes this to increased secretions and postnasal drip and not using his CPAP.   Currently Rx'd norvasc, hydralazine, labetalol, lisinopril and clonidine.  He recently saw cardiology on 08/18/17 to review and adjust BP meds.  He believes that's when he got sick due to numerous sick contacts.  Cardiology chart reviewed states that they encouraged the patient to spread out his medications as prescribed and they will attempt to wean him off clonidine.        Patient Active Problem List   Diagnosis Date Noted  . Essential  hypertension 08/14/2017  . OSA (obstructive sleep apnea) 08/02/2017  . Chronic constipation 01/14/2015  . Prostate cancer screening 01/14/2015  . Screening for colorectal cancer 01/14/2015  . Fatigue 01/14/2015  . Aortic root dilatation ([place]) 07/14/2014  . Chest pain 02/27/2014  . HTN (hypertension), malignant 02/27/2014     Prior to Admission medications   Medication Sig Start Date End Date Taking? Authorizing Provider  amLODipine (NORVASC) 10 MG tablet TAKE 1 TABLET BY MOUTH  DAILY 05/01/17  Yes Herminio Commons, MD  aspirin EC 81 MG tablet Take 1 tablet (81 mg total) by mouth daily. 10/08/15  Yes Rehman, Mechele Dawley, MD  baclofen (LIORESAL) 10 MG tablet Take 1 tablet (10 mg total) by mouth 3 (three) times daily. 08/14/17 08/14/18 Yes Orlena Sheldon, PA-C  cloNIDine (CATAPRES) 0.1 MG tablet Take 1 tablet (0.1 mg total) by mouth 3 (three) times daily. 08/18/17  Yes Strader, [place] M, PA-C  hydrALAZINE (APRESOLINE) 50 MG tablet Take 1.5 tablets (75 mg total) by mouth 3 (three) times daily. 08/18/17 11/16/17 Yes Strader, Fransisco Hertz, PA-C  labetalol (NORMODYNE) 100 MG tablet Take 1 tablet by mouth two  times daily 05/13/15  Yes Herminio Commons, MD  linaclotide (LINZESS) 72 MCG capsule Take 1 capsule (72 mcg total) by mouth daily before breakfast. Patient taking differently: Take 72 mcg by mouth daily as needed.  04/08/16  Yes Dena Billet B, PA-C  lisinopril (PRINIVIL,ZESTRIL) 40 MG tablet TAKE 1 TABLET BY MOUTH  DAILY 05/01/17  Yes Herminio Commons, MD  Multiple Vitamins-Minerals (ONE-A-DAY MENS 50+ ADVANTAGE  PO) Take 1 tablet by mouth daily.   Yes [provider]  traMADol (ULTRAM) 50 MG tablet Take 1 tablet (50 mg total) by mouth every 6 (six) hours as needed. 01/20/16  Yes Milton Ferguson, MD     Allergies  Allergen Reactions  . Bee Venom Swelling     Family History  Problem Relation Age of Onset  . Hypertension Mother   . Heart failure Mother   . Heart disease Mother    . Hypertension Father   . Diabetes Father   . Vision loss Father   . Hypertension Brother   . Hypertension Brother   . Hypertension Brother      Social History   Socioeconomic History  . Marital status: Married    Spouse name: Not on file  . Number of children: Not on file  . Years of education: Not on file  . Highest education level: Not on file  Occupational History  . Not on file  Social Needs  . Financial resource strain: Not on file  . Food insecurity:    Worry: Not on file    Inability: Not on file  . Transportation needs:    Medical: Not on file    Non-medical: Not on file  Tobacco Use  . Smoking status: Never Smoker  . Smokeless tobacco: Never Used  Substance and Sexual Activity  . Alcohol use: No    Alcohol/week: 0.0 oz  . Drug use: No  . Sexual activity: Yes  Lifestyle  . Physical activity:    Days per week: Not on file    Minutes per session: Not on file  . Stress: Not on file  Relationships  . Social connections:    Talks on phone: Not on file    Gets together: Not on file    Attends religious service: Not on file    Active member of club or organization: Not on file    Attends meetings of clubs or organizations: Not on file    Relationship status: Not on file  . Intimate partner violence:    Fear of current or ex partner: Not on file    Emotionally abused: Not on file    Physically abused: Not on file    Forced sexual activity: Not on file  Other Topics Concern  . Not on file  Social History Narrative   Married.    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     Review of Systems  Constitutional: Positive for appetite change, chills, diaphoresis, fatigue and fever. Negative for activity change.  HENT: Positive for congestion, postnasal drip, rhinorrhea, sinus pressure, sore throat  and voice change.   Eyes: Negative.  Negative for photophobia, redness and visual disturbance.  Respiratory: Positive for cough, chest tightness, shortness of breath and wheezing. Negative for apnea and stridor.   Cardiovascular: Positive for chest pain (ribs sore when coughing). Negative for palpitations and leg swelling.  Gastrointestinal: Negative.  Negative for abdominal distention, abdominal pain, diarrhea, nausea and vomiting.  Endocrine: Negative.   Genitourinary: Negative for decreased urine volume, frequency and urgency.  Musculoskeletal: Negative.  Negative for myalgias, neck pain and neck stiffness.  Skin: Negative.  Negative for color change, pallor and rash.  Neurological: Positive for light-headedness. Negative for dizziness, tremors, syncope, facial asymmetry, weakness, numbness and  headaches.  Hematological: Negative.   Psychiatric/Behavioral: Negative.   All other systems reviewed and are negative.      Objective:    Vitals:   08/29/17 1600  BP: (!) 208/110  Pulse: (!) 119  Temp: (!) 101.1 F (38.4 C)  TempSrc: Oral  SpO2: 95%  Weight: 238 lb 2 oz (108 kg)     Physical Exam  Constitutional: He is oriented to person, place, and time. He appears well-developed and well-nourished. He is cooperative.  Non-toxic appearance. He appears ill. No distress.  HENT:  Head: Normocephalic and atraumatic.  Right Ear: External ear and ear canal normal.  Left Ear: External ear and ear canal normal.  Nose: Mucosal edema and rhinorrhea present. Right sinus exhibits no maxillary sinus tenderness and no frontal sinus tenderness. Left sinus exhibits no maxillary sinus tenderness and no frontal sinus tenderness.  Mouth/Throat: Uvula is midline and mucous membranes are normal. Mucous membranes are not pale, not dry and not cyanotic. No trismus in the jaw. No uvula swelling. Posterior oropharyngeal erythema present. No oropharyngeal exudate or posterior oropharyngeal edema.  Eyes:  Pupils are equal, round, and reactive to light. Conjunctivae, EOM and lids are normal.  Neck: Trachea normal, normal range of motion and phonation normal. Neck supple. No tracheal deviation present.  Cardiovascular: Regular rhythm, normal heart sounds, intact distal pulses and normal pulses.  No extrasystoles are present. Tachycardia present. Exam reveals no gallop and no friction rub.  No murmur heard. Pulses:      Radial pulses are 2+ on the right side, and 2+ on the left side.       Posterior tibial pulses are 2+ on the right side, and 2+ on the left side.  No lower extremity edema   Pulmonary/Chest: Effort normal. He exhibits tenderness.  No respiratory distress, no accessory muscle use, no retractions Diminished breath sounds bilaterally from middle to lower lobes, left middle lobe with expiratory wheeze, scattered rhonchi, frequent cough Normal percussion to all posterior lung fields   Abdominal: Soft. Normal appearance and bowel sounds are normal. He exhibits no distension. There is no tenderness. There is no rebound and no guarding.  Musculoskeletal: Normal range of motion. He exhibits no edema.  Lymphadenopathy:    He has cervical adenopathy.  Neurological: He is alert and oriented to person, place, and time. He exhibits normal muscle tone. Coordination and gait normal.  Skin: Skin is warm and intact. Capillary refill takes less than 2 seconds. No rash noted. He is diaphoretic. No erythema. No pallor.  Psychiatric: He has a normal mood and affect. His speech is normal and behavior is normal.  Nursing note and vitals reviewed.      08/29/17 EKG: Sinus tach, LVH no evidence of ischemia or infarction no ST segment depressions or elevation  Recent CV testing  EKG:  EKG is ordered today.  The ekg ordered today demonstrates NSR, HR 76, with borderline LVH. No acute ST or T-wave changes when compared to prior tracings.    Echocardiogram: 02/2014 Study Conclusions  - Procedure  narrative: Transthoracic echocardiography. Image quality was suboptimal. The study was technically difficult, as a result of poor sound wave transmission. - Left ventricle: The cavity size was normal. Systolic function was normal. The estimated ejection fraction was in the range of 60% to 65%. Wall motion was normal; there were no regional wall motion abnormalities. Doppler parameters are consistent with abnormal left ventricular relaxation (grade 1 diastolic dysfunction). Mild to moderate concentric LVH. - Aorta: Mild aortic  root dilatation. Aortic root dimension: 44 mm (ED).      Assessment & Plan:   1. Pneumonia of right lung due to infectious organism, unspecified part of lung Patient has diminished breath sounds in the right base.  There are faint expiratory crackles appreciated on exam.  Flu test is negative.  Will cover the patient for possible community-acquired pneumonia with a Z-Pak.  Obtain chest x-ray.  Patient also appears to have reactive airway disease.  Patient was given a prescription for albuterol inhaler.  2 puffs inhaled every 4-6 hours as needed for wheezing or shortness of breath. Recheck in 2 days Pt understands ER precautions  - ipratropium-albuterol (DUONEB) 0.5-2.5 (3) MG/3ML nebulizer solution 3 mL - albuterol (PROVENTIL) (2.5 MG/3ML) 0.083% nebulizer solution 2.5 mg  Patient was reevaluated in clinic after administration of breathing treatment, he had cleared expiratory wheeze in left lung fields, increased and improved breath sounds in bilateral middle lobes and and left lower lobe.  Coarse rales and diminished breath sounds in right lung base  - DG Chest 2 View- pending  2. hypertensive urgency, asymptomatic HTN urgency, suspected secondary to abrupt d/c of all BP meds and taking multiple OTC cold and cough meds EKG shows no sign of endorgan damage.  Reviewed by Dr. Dennard Schaumann. Patient has no evidence of encephalopathy or strokelike symptoms.   He denies any blurry vision.  He denies any oliguria.  Respiratory complaints today suspect are from infection, will review CXR to ensure no pulmonary edema, exam today not consistent with acute CHF, pt appears euvolemic.   Recommended immediate resumption of all BP medication as prescribed and d/c OTC cough and cold meds. Will follow closely, recheck in <2 days Reviewed at length ER precautions, pt and pt's wife verbalize understanding.  3. Tachycardia Likely multifactorial- pt febrile, pt holding his labetalol, infection, OTC meds (sudafed and cough meds) - EKG 12-Lead -sinus tachycardia,    Delsa Grana, PA-C 08/29/17 4:38 PM

## 2017-08-31 ENCOUNTER — Encounter: Payer: Self-pay | Admitting: Family Medicine

## 2017-08-31 ENCOUNTER — Ambulatory Visit (INDEPENDENT_AMBULATORY_CARE_PROVIDER_SITE_OTHER): Payer: 59 | Admitting: Family Medicine

## 2017-08-31 VITALS — BP 110/64 | HR 76 | Temp 97.8°F | Resp 18 | Ht 73.0 in | Wt 237.1 lb

## 2017-08-31 DIAGNOSIS — J189 Pneumonia, unspecified organism: Secondary | ICD-10-CM

## 2017-08-31 DIAGNOSIS — R062 Wheezing: Secondary | ICD-10-CM | POA: Diagnosis not present

## 2017-08-31 MED ORDER — PREDNISONE 20 MG PO TABS: ORAL_TABLET | ORAL | 0 refills | Status: DC

## 2017-08-31 MED ORDER — IPRATROPIUM-ALBUTEROL 0.5-2.5 (3) MG/3ML IN SOLN
3.0000 mL | Freq: Once | RESPIRATORY_TRACT | Status: AC
  Administered 2017-08-31: 3 mL via RESPIRATORY_TRACT

## 2017-08-31 MED ORDER — ALBUTEROL SULFATE (2.5 MG/3ML) 0.083% IN NEBU
2.5000 mg | INHALATION_SOLUTION | Freq: Once | RESPIRATORY_TRACT | Status: AC
  Administered 2017-08-31: 2.5 mg via RESPIRATORY_TRACT

## 2017-08-31 MED ORDER — METHYLPREDNISOLONE SODIUM SUCC 40 MG IJ SOLR: 60.0000 mg | Freq: Once | INTRAMUSCULAR | Status: DC

## 2017-08-31 MED ORDER — ALBUTEROL SULFATE (2.5 MG/3ML) 0.083% IN NEBU: 2.5000 mg | INHALATION_SOLUTION | RESPIRATORY_TRACT | 0 refills | Status: DC | PRN

## 2017-08-31 MED ORDER — METHYLPREDNISOLONE ACETATE 80 MG/ML IJ SUSP
80.0000 mg | Freq: Once | INTRAMUSCULAR | Status: AC
  Administered 2017-08-31: 80 mg via INTRAMUSCULAR

## 2017-08-31 NOTE — Progress Notes (Signed)
Patient ID: BROADUS COSTILLA, male    DOB: February 09, 1961, 57 y.o.   MRN: [id number]  PCP: Orlena Sheldon, PA-C  Chief Complaint  Patient presents with  . Cough    Patient in today for follow up from visit on Tuesday. States doing a lot better.  BP better on medication    Subjective:   George Pratt is a 57 y.o. male, returns to clinic for recheck of pneumonia and elevated blood pressure due to patient holding his BP meds while ill and taking over-the-counter medication.  Overall he feels improved but he states he is still coughing.  Fever and sweats have resolved.  He was unable to get his albuterol inhaler because the pharmacy had out of stock so he used his daughter's inhaler and nebulizer which significantly improves his shortness of breath and tightness.  It does increase his coughing somewhat.  Cough is also still worse with laying down so he is propped the head of bed up at night.  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  He states overall he has decreased sputum production.  He has some continued tightness and wheeziness but overall he has improved energy and improved shortness of breath.  No other symptoms or side effects. He did go home 2 days ago and take all of his blood pressure medications as prescribed and noted rechecking at home his systolic blood pressure was 120.  It is 110/64 today.  He has no palpitations, syncope or near syncope, lower extremity edema, chest pain.       Visit HPI from 08/29/17: George Pratt is a 57 y.o. male, with PMH of hypertension, aortic root dilatation, obstructive sleep apnea, chronic constipation, presents to clinic with 2 weeks of worsening productive cough with green sputum, chest tightness, nasal congestion, sore throat, fever with T-max of 102.  When endorses shortness of breath associated with coughing fits which are particularly worse at night and when lying down, also endorses near  syncope with coughing fits.  Associated exertional dyspnea with mild activity, fatigue, decreased appetite, 3 pound weight loss, and profuse night sweats.  He has had no improvement with NyQuil, Robitussin, Benadryl and Mucinex.  While taking these over-the-counter medications the patient held all of his blood pressure medication and upon presentation today, blood pressure is elevated to 208/110.  George Pratt denies CP, chest pressure, syncope, HA, confusion, LE edema, decreased urine output. George Pratt has anterior chest wall pain occurs with palpation or coughing. He denies any headache, back pain, syncope, palpitations, numbness.  He does feel like he wakes up choking, and he attributes this to increased secretions and postnasal drip and not using his CPAP.   Currently Rx'd norvasc, hydralazine, labetalol, lisinopril and clonidine.  He recently saw cardiology on 08/18/17 to review and adjust BP meds.  He believes that's when he got sick due to numerous sick contacts.  Cardiology chart reviewed states that they encouraged the patient to spread out his medications as prescribed and they will attempt to wean him off clonidine.       Patient Active Problem List   Diagnosis Date Noted  . Essential hypertension 08/14/2017  . OSA (obstructive sleep apnea) 08/02/2017  . Chronic constipation 01/14/2015  . Prostate cancer screening 01/14/2015  . Screening for colorectal cancer 01/14/2015  . Fatigue 01/14/2015  . Aortic root dilatation ([place]) 07/14/2014  . Chest pain 02/27/2014  . HTN (hypertension),  malignant 02/27/2014     Prior to Admission medications   Medication Sig Start Date End Date Taking? Authorizing Provider  albuterol (PROVENTIL HFA;VENTOLIN HFA) 108 (90 Base) MCG/ACT inhaler Inhale 2 puffs into the lungs every 6 (six) hours as needed for wheezing or shortness of breath. 08/29/17  Yes Delsa Grana, PA-C  amLODipine (NORVASC) 10 MG tablet TAKE 1 TABLET BY MOUTH  DAILY 05/01/17  Yes Herminio Commons, MD    aspirin EC 81 MG tablet Take 1 tablet (81 mg total) by mouth daily. 10/08/15  Yes Rehman, Mechele Dawley, MD  azithromycin (ZITHROMAX) 250 MG tablet 2 tabs poqday1, 1 tab poqday 2-5 08/29/17  Yes Jhane Lorio, PA-C  baclofen (LIORESAL) 10 MG tablet Take 1 tablet (10 mg total) by mouth 3 (three) times daily. 08/14/17 08/14/18 Yes Dixon, Mary B, PA-C  benzonatate (TESSALON) 100 MG capsule Take 1 capsule (100 mg total) by mouth 3 (three) times daily as needed for cough. 08/29/17  Yes Delsa Grana, PA-C  cloNIDine (CATAPRES) 0.1 MG tablet Take 1 tablet (0.1 mg total) by mouth 3 (three) times daily. 08/18/17  Yes Strader, [place] M, PA-C  hydrALAZINE (APRESOLINE) 50 MG tablet Take 1.5 tablets (75 mg total) by mouth 3 (three) times daily. 08/18/17 11/16/17 Yes Strader, Fransisco Hertz, PA-C  labetalol (NORMODYNE) 100 MG tablet Take 1 tablet by mouth two  times daily 05/13/15  Yes Herminio Commons, MD  linaclotide (LINZESS) 72 MCG capsule Take 1 capsule (72 mcg total) by mouth daily before breakfast. Patient taking differently: Take 72 mcg by mouth daily as needed.  04/08/16  Yes Dena Billet B, PA-C  lisinopril (PRINIVIL,ZESTRIL) 40 MG tablet TAKE 1 TABLET BY MOUTH  DAILY 05/01/17  Yes Herminio Commons, MD  Multiple Vitamins-Minerals (ONE-A-DAY MENS 50+ ADVANTAGE PO) Take 1 tablet by mouth daily.   Yes [provider]  traMADol (ULTRAM) 50 MG tablet Take 1 tablet (50 mg total) by mouth every 6 (six) hours as needed. 01/20/16  Yes Milton Ferguson, MD  albuterol (PROVENTIL) (2.5 MG/3ML) 0.083% nebulizer solution Take 3 mLs (2.5 mg total) by nebulization every 4 (four) hours as needed for wheezing or shortness of breath. 08/31/17   Delsa Grana, PA-C  predniSONE (DELTASONE) 20 MG tablet Take 3 daily for 2 days, then 2 daily for 2 days, then 1 daily for 2 days. 09/01/17   Delsa Grana, PA-C     Allergies  Allergen Reactions  . Bee Venom Swelling     Family History  Problem Relation Age of Onset  . Hypertension  Mother   . Heart failure Mother   . Heart disease Mother   . Hypertension Father   . Diabetes Father   . Vision loss Father   . Hypertension Brother   . Hypertension Brother   . Hypertension Brother      Social History   Socioeconomic History  . Marital status: Married    Spouse name: Not on file  . Number of children: Not on file  . Years of education: Not on file  . Highest education level: Not on file  Occupational History  . Not on file  Social Needs  . Financial resource strain: Not on file  . Food insecurity:    Worry: Not on file    Inability: Not on file  . Transportation needs:    Medical: Not on file    Non-medical: Not on file  Tobacco Use  . Smoking status: Never Smoker  . Smokeless tobacco: Never Used  Substance and Sexual Activity  . Alcohol use: No    Alcohol/week: 0.0 oz  . Drug use: No  . Sexual activity: Yes  Lifestyle  . Physical activity:    Days per week: Not on file    Minutes per session: Not on file  . Stress: Not on file  Relationships  . Social connections:    Talks on phone: Not on file    Gets together: Not on file    Attends religious service: Not on file    Active member of club or organization: Not on file    Attends meetings of clubs or organizations: Not on file    Relationship status: Not on file  . Intimate partner violence:    Fear of current or ex partner: Not on file    Emotionally abused: Not on file    Physically abused: Not on file    Forced sexual activity: Not on file  Other Topics Concern  . 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     Review of Systems  Constitutional: Negative.  Negative for activity change, appetite change, chills, diaphoresis, fatigue, fever and unexpected weight change.    HENT: Positive for congestion and postnasal drip.   Eyes: Negative.   Respiratory: Positive for cough, chest tightness, shortness of breath and wheezing. Negative for apnea, choking and stridor.   Cardiovascular: Negative.  Negative for chest pain, palpitations and leg swelling.  Gastrointestinal: Negative.   Endocrine: Negative.   Genitourinary: Negative.   Musculoskeletal: Negative.   Skin: Negative.  Negative for color change, pallor and rash.  Neurological: Negative.  Negative for syncope, weakness, light-headedness and headaches.  Hematological: Negative.   Psychiatric/Behavioral: Negative.   All other systems reviewed and are negative.      Objective:    Vitals:   08/31/17 1143  BP: 110/64  Pulse: 76  Resp: 18  Temp: 97.8 F (36.6 C)  TempSrc: Oral  SpO2: 95%  Weight: 237 lb 2 oz (107.6 kg)  Height: 6\' 1"  (1.854 m)     Physical Exam  Constitutional: He is oriented to person, place, and time. He appears well-developed and well-nourished. No distress.  Mildly ill appearing with frequent cough, nontoxic  HENT:  Head: Normocephalic and atraumatic.  Mouth/Throat: Oropharynx is clear and moist.  Nose congested with moderate discharge Mucous membranes moist  Eyes: Pupils are equal, round, and reactive to light. Conjunctivae are normal.  Watery eyes when coughing  Neck: Normal range of motion. Neck supple. No tracheal deviation present.  Cardiovascular: Normal rate, regular rhythm, normal heart sounds and intact distal pulses. Exam reveals no gallop and no friction rub.  No murmur heard. No lower extremity edema  Pulmonary/Chest: Effort normal. No stridor. No respiratory distress. He has wheezes. He has rales. He exhibits no tenderness.  End expiratory wheeze with diminished breath sounds right lower lobe Scattered rhonchi throughout Clear to auscultation throughout left lung fields Frequent coughing fits No tachypnea, no retractions  Abdominal: Soft. Bowel sounds  are normal. He exhibits no distension. There is no tenderness. There is no rebound and no guarding.  Musculoskeletal: Normal range of motion.  Lymphadenopathy:    He has no cervical adenopathy.  Neurological: He is alert and oriented to  person, place, and time. He exhibits normal muscle tone. Coordination normal.  Skin: Skin is warm and dry. Capillary refill takes less than 2 seconds. No rash noted. He is not diaphoretic. No erythema. No pallor.  Psychiatric: Judgment normal.  Nursing note and vitals reviewed.           Assessment & Plan:   Recheck a 57 year old male who was seen 2 days ago, diagnosed with pneumonia right lower lobe, also had hypertensive urgency without endorgan damage due to patient holding his BP meds, here for recheck   1. Pneumonia of right lung due to infectious organism, unspecified part of lung Chest x-ray done 2 days ago was negative for infiltrate and showed bronchitic changes.  Suspect probable bronchopneumonia.  Patient continues to have decreased breath sounds to the right lower lobe and increased wheeze today given breathing treatments in clinic which did improve breath sounds and cleared wheeze.  Prescribed nebs and started steroid today - albuterol (PROVENTIL) (2.5 MG/3ML) 0.083% nebulizer solution 2.5 mg - ipratropium-albuterol (DUONEB) 0.5-2.5 (3) MG/3ML nebulizer solution 3 mL  2. Wheeze See above - albuterol (PROVENTIL) (2.5 MG/3ML) 0.083% nebulizer solution 2.5 mg - ipratropium-albuterol (DUONEB) 0.5-2.5 (3) MG/3ML nebulizer solution 3 mL - albuterol (PROVENTIL) (2.5 MG/3ML) 0.083% nebulizer solution; Take 3 mLs (2.5 mg total) by nebulization every 4 (four) hours as needed for wheezing or shortness of breath.  Dispense: 150 mL; Refill: 0 - predniSONE (DELTASONE) 20 MG tablet; Take 3 daily for 2 days, then 2 daily for 2 days, then 1 daily for 2 days.  Dispense: 12 tablet; Refill: 0 - methylPREDNISolone acetate (DEPO-MEDROL) injection 80 mg   3.   HTN urgency secondary to George Pratt holding all HTN meds Recheck BP today, patient has resumed all his medications as prescribed and he will follow-up with cardiology later this month.  Delsa Grana, PA-C 08/31/17 1:32 PM

## 2017-08-31 NOTE — Patient Instructions (Addendum)
Continue meds as prescribed and add prednisone - starting tomorrow.  If you do not feel significantly better in one week, we can see you again.   Community-Acquired Pneumonia, Adult Pneumonia is an infection of the lungs. One type of pneumonia can happen while a person is in a hospital. A different type can happen when a person is not in a hospital (community-acquired pneumonia). It is easy for this kind to spread from person to person. It can spread to you if you breathe near an infected person who coughs or sneezes. Some symptoms include:  A dry cough.  A wet (productive) cough.  Fever.  Sweating.  Chest pain.  Follow these instructions at home:  Take over-the-counter and prescription medicines only as told by your doctor. ? Only take cough medicine if you are losing sleep. ? If you were prescribed an antibiotic medicine, take it as told by your doctor. Do not stop taking the antibiotic even if you start to feel better.  Sleep with your head and neck raised (elevated). You can do this by putting a few pillows under your head, or you can sleep in a recliner.  Do not use tobacco products. These include cigarettes, chewing tobacco, and e-cigarettes. If you need help quitting, ask your doctor.  Drink enough water to keep your pee (urine) clear or pale yellow. A shot (vaccine) can help prevent pneumonia. Shots are often suggested for:  People older than 59 years of age.  People older than 59 years of age: ? Who are having cancer treatment. ? Who have long-term (chronic) lung disease. ? Who have problems with their body's defense system (immune system).  You may also prevent pneumonia if you take these actions:  Get the flu (influenza) shot every year.  Go to the dentist as often as told.  Wash your hands often. If soap and water are not available, use hand sanitizer.  Contact a doctor if:  You have a fever.  You lose sleep because your cough medicine does not help. Get  help right away if:  You are short of breath and it gets worse.  You have more chest pain.  Your sickness gets worse. This is very serious if: ? You are an older adult. ? Your body's defense system is weak.  You cough up blood. This information is not intended to replace advice given to you by your health care provider. Make sure you discuss any questions you have with your health care provider. Document Released: 11/02/2007 Document Revised: 10/22/2015 Document Reviewed: 09/10/2014 Elsevier Interactive Patient Education  2018 [employer].  Bronchospasm, Adult Bronchospasm is a tightening of the airways going into the lungs. During an episode, it may be harder to breathe. You may cough, and you may make a whistling sound when you breathe (wheeze). This condition often affects people with asthma. What are the causes? This condition is caused by swelling and irritation in the airways. It can be triggered by:  An infection (common).  Seasonal allergies.  An allergic reaction.  Exercise.  Irritants. These include pollution, cigarette smoke, strong odors, aerosol sprays, and paint fumes.  Weather changes. Winds increase molds and pollens in the air. Cold air may cause swelling.  Stress and emotional upset.  What are the signs or symptoms? Symptoms of this condition include:  Wheezing. If the episode was triggered by an allergy, wheezing may start right away or hours later.  Nighttime coughing.  Frequent or severe coughing with a simple cold.  Chest tightness.  Shortness of breath.  Decreased ability to exercise.  How is this diagnosed? This condition is usually diagnosed with a review of your medical history and a physical exam. Tests, such as lung function tests, are sometimes done to look for other conditions. The need for a chest X-ray depends on where the wheezing occurs and whether it is the first time you have wheezed. How is this treated? This condition may be  treated with:  Inhaled medicines. These open up the airways and help you breathe. They can be taken with an inhaler or a nebulizer device.  Corticosteroid medicines. These may be given for severe bronchospasm, usually when it is associated with asthma.  Avoiding triggers, such as irritants, infection, or allergies.  Follow these instructions at home: Medicines  Take over-the-counter and prescription medicines only as told by your health care provider.  If you need to use an inhaler or nebulizer to take your medicine, ask your health care provider to explain how to use it correctly. If you were given a spacer, always use it with your inhaler. Lifestyle  Reduce the number of triggers in your home. To do this: ? Change your heating and air conditioning filter at least once a month. ? Limit your use of fireplaces and wood stoves. ? Do not smoke. Do not allow smoking in your home. ? Avoid using perfumes and fragrances. ? Get rid of pests, such as roaches and mice, and their droppings. ? Remove any mold from your home. ? Keep your house clean and dust free. Use unscented cleaning products. ? Replace carpet with wood, tile, or vinyl flooring. Carpet can trap dander and dust. ? Use allergy-proof pillows, mattress covers, and box spring covers. ? Wash bed sheets and blankets every week in hot water. Dry them in a dryer. ? Use blankets that are made of polyester or cotton. ? Wash your hands often. ? Do not allow pets in your bedroom.  Avoid breathing in cold air when you exercise. General instructions  Have a plan for seeking medical care. Know when to call your health care provider and local emergency services, and where to get emergency care.  Stay up to date on your immunizations.  When you have an episode of bronchospasm, stay calm. Try to relax and breathe more slowly.  If you have asthma, make sure you have an asthma action plan.  Keep all follow-up visits as told by your health  care provider. This is important. Contact a health care provider if:  You have muscle aches.  You have chest pain.  The mucus that you cough up (sputum) changes from clear or white to yellow, green, gray, or bloody.  You have a fever.  Your sputum gets thicker. Get help right away if:  Your wheezing and coughing get worse, even after you take your prescribed medicines.  It gets even harder to breathe.  You develop severe chest pain. Summary  Bronchospasm is a tightening of the airways going into the lungs.  During an episode of bronchospasm, you may have a harder time breathing. You may cough and make a whistling sound when you breathe (wheeze).  Avoid exposure to triggers such as smoke, dust, mold, animal dander, and fragrances.  When you have an episode of bronchospasm, stay calm. Try to relax and breathe more slowly. This information is not intended to replace advice given to you by your health care provider. Make sure you discuss any questions you have with your health care provider. Document Released:  05/19/2003 Document Revised: 05/12/2016 Document Reviewed: 05/12/2016 Elsevier Interactive Patient Education  2017 [employer].

## 2017-09-22 ENCOUNTER — Ambulatory Visit: Payer: 59 | Admitting: Cardiovascular Disease

## 2017-09-28 ENCOUNTER — Encounter: Payer: Self-pay | Admitting: Neurology

## 2017-09-28 ENCOUNTER — Ambulatory Visit (INDEPENDENT_AMBULATORY_CARE_PROVIDER_SITE_OTHER): Payer: 59 | Admitting: Neurology

## 2017-09-28 VITALS — BP 144/91 | HR 68 | Ht 73.0 in | Wt 232.0 lb

## 2017-09-28 DIAGNOSIS — E669 Obesity, unspecified: Secondary | ICD-10-CM | POA: Diagnosis not present

## 2017-09-28 DIAGNOSIS — G4733 Obstructive sleep apnea (adult) (pediatric): Secondary | ICD-10-CM

## 2017-09-28 DIAGNOSIS — J120 Adenoviral pneumonia: Secondary | ICD-10-CM | POA: Diagnosis not present

## 2017-09-28 NOTE — Progress Notes (Signed)
SLEEP MEDICINE CLINIC   Provider:  Larey Seat, M D  Primary Care Physician:  Rennis Golden   Referring Provider: Orlena Sheldon, PA-C    Chief Complaint  Patient presents with  . New Patient (Initial Visit)    pt alone, rm . pt had a sleep study years ago or more and was diagnosed with sleep apnea. started a CPAP and used until it broke 2 years ago. if he sleeps on side he doesnt have issues but if rolls on back it becomes an issue. pt has high blood pressure.     HPI:  George Pratt is a 57 y.o. male , a professor in health sciences, and seen here as in a referral from Dr. Doren Custard for evaluation of sleep apnea.  The patient had recently overcome pneumonia.  Mr. Aloisi had established care with Dena Billet, PA in August 2016 as a primary care provider.  His father Dr. Renato Battles was a pediatrician in [place].  Is treated him for some time for any medical problems.  He does have a cardiologist that he sees routinely.  In March of this year he reported that his blood pressures have been all over the place and had been elevated.  He was quite shortly after diagnosed with the with pneumonia.  He also has a dilated aortic arch and knows that high blood pressures can foster that condition make it worse.  He has been treated for obstructive sleep apnea by CPAP he has had his CPAP since 2013 and had no evaluation of sleep apnea since and no follow-up.  He has not been necessarily routinely using CPAP.  But is now worried because sleep apnea may be contributing to his hypertension.  He would like a new evaluation.  He is currently on Norvasc, hydralazine, labetalol, lisinopril, tramadol as needed, Linzess for constipation.  Family history of hypertension heart failure and heart disease in his mother hypertension diabetes in his father, brother affected by hypertension.   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 Switches TV off and avoid  screen light after 9 Pm and goes to bed between 10 and 11 Pm. He has no trouble to initiate sleep, snores only in supine, not when on his side. Sleeps on 4 pillows. He uses an adjustable bed and sleeps reclined.  He doesn't use CPAP currently, sleeps through until 2 AM and again 4 Am ( nocturia). Sleeps up to 8 hours, once or twice has nocturia.  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  He starts 10, over there 8 AM with sessions.  He rises at 6.30-7 AM , gets the kids ready for school ( children are from 12, from his first marriage - 2 grandchildren live with him  -and a daughter , 35 years old with second wife.  All go to school together.   Sleep medical history and family sleep history:  Pneumonia, HTN, constipation.   Social history: non smoker, never, OTOH, caffeine - no caffeine. No shift work.    Review of Systems: Out of a complete 14 system review, the patient complains of only the following symptoms, and all other reviewed systems are negative. Dysphonia, hoarseness, tachycardia, HTN, coughing, weight gain.   Epworth score  4/ 24  , Fatigue severity score 19/ 63   , depression score 2/ 15    Social History   Socioeconomic History  . Marital status: Married  Spouse name: Not on file  . Number of children: Not on file  . Years of education: Not on file  . Highest education level: Not on file  Occupational History  . Not on file  Social Needs  . Financial resource strain: Not on file  . Food insecurity:    Worry: Not on file    Inability: Not on file  . Transportation needs:    Medical: Not on file    Non-medical: Not on file  Tobacco Use  . Smoking status: Never Smoker  . Smokeless tobacco: Never Used  Substance and Sexual Activity  . Alcohol use: No    Alcohol/week: 0.0 oz  . Drug use: No  . Sexual activity: Yes  Lifestyle  . Physical activity:    Days per week: Not on file    Minutes per session: Not on file  . Stress: Not on file  Relationships  .  Social connections:    Talks on phone: Not on file    Gets together: Not on file    Attends religious service: Not on file    Active member of club or organization: Not on file    Attends meetings of clubs or organizations: Not on file    Relationship status: Not on file  . Intimate partner violence:    Fear of current or ex partner: Not on file    Emotionally abused: Not on file    Physically abused: Not on file    Forced sexual activity: Not on file  Other Topics Concern  . Not on file  Social History Narrative   Married.    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    --Is available for students to call him, email him, etc   Says it is for a group of multiple colleges--in [facility], etc.    Family History  Problem Relation Age of Onset  . Hypertension Mother   . Heart failure Mother   . Heart disease Mother   . Hypertension Father   . Diabetes Father   . Vision loss Father   . Hypertension Brother   . Hypertension Brother   . Hypertension Brother     Past Medical History:  Diagnosis Date  . Hypertension   . Sleep apnea     Past Surgical History:  Procedure Laterality Date  . APPENDECTOMY  2006  . COLONOSCOPY N/A 10/01/2015   Procedure: COLONOSCOPY;  Surgeon: Rogene Houston, MD;  Location: [facility];  Service: Endoscopy;  Laterality: N/A;  1030  . KNEE SURGERY    . NECK SURGERY  2000    Current Outpatient Medications  Medication Sig Dispense Refill  . amLODipine (NORVASC) 10 MG tablet TAKE 1 TABLET BY MOUTH  DAILY 90 tablet 1  . aspirin EC 81 MG tablet Take 1 tablet (81 mg total) by mouth daily.    . baclofen (LIORESAL) 10 MG tablet Take 1 tablet (10 mg total) by mouth 3 (three) times daily. 90 tablet 1  . cloNIDine (CATAPRES) 0.1 MG tablet Take 1 tablet (0.1 mg total) by mouth 3 (three) times daily. 90 tablet 11  . hydrALAZINE (APRESOLINE) 50 MG tablet Take 1.5 tablets (75 mg total) by mouth 3 (three) times daily. 405  tablet 3  . labetalol (NORMODYNE) 100 MG tablet Take 1 tablet by mouth two  times daily 180 tablet 3  . linaclotide (LINZESS) 72 MCG capsule Take 1 capsule (72 mcg total) by mouth daily  before breakfast. (Patient taking differently: Take 72 mcg by mouth daily as needed. ) 90 capsule 0  . lisinopril (PRINIVIL,ZESTRIL) 40 MG tablet TAKE 1 TABLET BY MOUTH  DAILY 90 tablet 1  . Multiple Vitamins-Minerals (ONE-A-DAY MENS 50+ ADVANTAGE PO) Take 1 tablet by mouth daily.    . traMADol (ULTRAM) 50 MG tablet Take 1 tablet (50 mg total) by mouth every 6 (six) hours as needed. 15 tablet 0   No current facility-administered medications for this visit.     Allergies as of 09/28/2017 - Review Complete 09/28/2017  Allergen Reaction Noted  . Bee venom Swelling 02/07/2014    Vitals: BP (!) 144/91   Pulse 68   Ht 6\' 1"  (1.854 m)   Wt 232 lb (105.2 kg)   BMI 30.61 kg/m  Last Weight:  Wt Readings from Last 1 Encounters:  09/28/17 232 lb (105.2 kg)   HBZ:JIRC mass index is 30.61 kg/m.     Last Height:   Ht Readings from Last 1 Encounters:  09/28/17 6\' 1"  (1.854 m)    Physical exam:  General: The patient is awake, alert and appears not in acute distress. The patient is well groomed. Head: Normocephalic, atraumatic. Neck is supple. Mallampati 3 ,  Used to wear jaw surgery, palate surgery. Overbite - neck circumference: 18 " . Nasal airflow congested , TMJ is not  evident . Retrognathia is not seen.  Cardiovascular:  Regular rate and rhythm , without  murmurs or carotid bruit, and without distended neck veins. Respiratory: Lungs are clear to auscultation. Skin:  Without evidence of edema, or rash Trunk: BMI is 30.6. The patient's posture is erect.   Neurologic exam : The patient is awake and alert, oriented to place and time.   Memory subjective described as intact.   Attention span & concentration ability appears normal.  Speech is fluent,  without dysarthria, dysphonia or aphasia.  Mood and  affect are appropriate. Dysphonia is evident.   Cranial nerves: Pupils are equal and briskly reactive to light.  Funduscopic exam deferred.  Extraocular movements in vertical and horizontal planes  without nystagmus. Visual fields by finger perimetry are intact. Hearing to finger rub intact. Facial sensation intact to fine touch.Facial motor strength is symmetric and tongue and uvula move midline. Shoulder shrug was symmetrical.   Motor exam:  Normal tone, muscle bulk and symmetric strength in all extremities. Sensory:  Fine touch, pinprick and vibration were normal in all extremities. Proprioception tested in the upper extremities was normal. Coordination: Rapid alternating movements/ Finger-to-nose maneuver normal without evidence of ataxia, dysmetria or tremor.  Gait and station: Patient walks without assistive device and is able unassisted to climb up to the exam table. Strength within normal limits.  Stance is stable and normal.  Tandem gait is unfragmented. Turns with 3 Steps.   Deep tendon reflexes: in the  upper and lower extremities are symmetric and intact. Babinski maneuver response is  downgoing.    Assessment:  After physical and neurologic examination, review of laboratory studies,  Personal review of imaging studies, reports of other /same  Imaging studies, results of polysomnography and / or neurophysiology testing and pre-existing records as far as provided in visit., my assessment is   1) the patient has moderate to mild risk factors for apnea, he reports that he does not even snore unless he is in supine position which she seems to instinctively avoid.  His body mass index is just above 30, Mallampati is a grade 3, he does  have some nasal congestion but the airway still patent, his neck circumference is around 18 inches which is a risk factor.  His current dysphonia is still related to the recovery from pneumonia- still hoarse  I plan a HST to confirm the presence of apnea  or not.    The patient was advised of the nature of the diagnosed disorder , the treatment options and the  risks for general health and wellness arising from not treating the condition.   I spent more than 45 minutes of face to face time with the patient.  Greater than 50% of time was spent in counseling and coordination of care. We have discussed the diagnosis and differential and I answered the patient's questions.    Plan:  Treatment plan and additional workup : Weight loss to be continued. He was much heavier when he was last sleep tested.  I have not seen his original sleep study from [facility] [place].  HST through our lab to confirm is OSA is still present.   Larey Seat, MD 06/01/7515, 00:17 AM  Certified in Neurology by ABPN Certified in [place] by [facility] [address] [place], [place] [address]

## 2017-09-28 NOTE — Patient Instructions (Signed)

## 2017-09-29 ENCOUNTER — Ambulatory Visit (INDEPENDENT_AMBULATORY_CARE_PROVIDER_SITE_OTHER): Payer: 59 | Admitting: Cardiovascular Disease

## 2017-09-29 ENCOUNTER — Encounter: Payer: Self-pay | Admitting: Cardiovascular Disease

## 2017-09-29 VITALS — BP 136/76 | HR 78 | Ht 72.0 in | Wt 231.0 lb

## 2017-09-29 DIAGNOSIS — I77810 Thoracic aortic ectasia: Secondary | ICD-10-CM

## 2017-09-29 DIAGNOSIS — I10 Essential (primary) hypertension: Secondary | ICD-10-CM

## 2017-09-29 DIAGNOSIS — E669 Obesity, unspecified: Secondary | ICD-10-CM

## 2017-09-29 NOTE — Patient Instructions (Addendum)
Your physician wants you to follow-up in: 6 months with Dr.Koneswaran You will receive a reminder letter in the mail two months in advance. If you don't receive a letter, please call our office to schedule the follow-up appointment.   Your physician recommends that you continue on your current medications as directed. Please refer to the Current Medication list given to you today.    If you need a refill on your cardiac medications before your next appointment, please call your pharmacy.     No lab work or tests ordered today.     Thank you for choosing [facility] !         

## 2017-09-29 NOTE — Progress Notes (Signed)
SUBJECTIVE: The patient presents for routine follow-up.  I last evaluated him in August 2017.  He has accelerated hypertension.  He saw B.  Strader PA-C on 08/18/2017.  She educated him on the proper timing of his antihypertensive medications.  Clonidine dose was reduced and hydralazine dose was increased. He also had exertional dyspnea at that time.  He reports having been diagnosed with pneumonia since that time.  Chest x-ray on 08/29/2017 showed no active cardia pulmonary disease with mild bronchitic changes.  He was put on steroids and inhalers.  He is feeling much better now.  He monitors his blood pressure routinely and most systolic readings are in the 130 range.  They gradually climbed 240-150 immediately before he has to take his morning and evening medications.  He denies chest pain, fatigue, and shortness of breath.  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  He wants to get down to 195 pounds with diet and exercise.  He is already lost 26 pounds.  He is in a much less stressful job now.  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  His father who is now 29 years old is a retired Lexicographer who practiced for 28 years in [place].  His father lives with his family now.   Review of Systems: As per "subjective", otherwise negative.  Allergies  Allergen Reactions  . Bee Venom Swelling    Current Outpatient Medications  Medication Sig Dispense Refill  . amLODipine (NORVASC) 10 MG tablet TAKE 1 TABLET BY MOUTH  DAILY 90 tablet 1  . aspirin EC 81 MG tablet Take 1 tablet (81 mg total) by mouth daily.    . baclofen (LIORESAL) 10 MG tablet Take 1 tablet (10 mg total) by mouth 3 (three) times daily. 90 tablet 1  . cloNIDine (CATAPRES) 0.1 MG  tablet Take 1 tablet (0.1 mg total) by mouth 3 (three) times daily. 90 tablet 11  . hydrALAZINE (APRESOLINE) 50 MG tablet Take 1.5 tablets (75 mg total) by mouth 3 (three) times daily. 405 tablet 3  . labetalol (NORMODYNE) 100 MG tablet Take 1 tablet by mouth two  times daily 180 tablet 3  . linaclotide (LINZESS) 72 MCG capsule Take 1 capsule (72 mcg total) by mouth daily before breakfast. (Patient taking differently: Take 72 mcg by mouth daily as needed. ) 90 capsule 0  . lisinopril (PRINIVIL,ZESTRIL) 40 MG tablet TAKE 1 TABLET BY MOUTH  DAILY 90 tablet 1  . Multiple Vitamins-Minerals (ONE-A-DAY MENS 50+ ADVANTAGE PO) Take 1 tablet by mouth daily.    . traMADol (ULTRAM) 50 MG tablet Take 1 tablet (50 mg total) by mouth every 6 (six) hours as needed. 15 tablet 0   No current facility-administered medications for this visit.     Past Medical History:  Diagnosis Date  . Hypertension   . Sleep apnea     Past Surgical History:  Procedure Laterality Date  . APPENDECTOMY  2006  . COLONOSCOPY N/A 10/01/2015   Procedure: COLONOSCOPY;  Surgeon: Rogene Houston, MD;  [facility]: [facility];  Service: Endoscopy;  Laterality: N/A;  1030  . KNEE SURGERY    . NECK SURGERY  2000    Social History   Socioeconomic History  . Marital status:  Married    Spouse name: Not on file  . Number of children: Not on file  . Years of education: Not on file  . Highest education level: Not on file  Occupational History  . Not on file  Social Needs  . Financial resource strain: Not on file  . Food insecurity:    Worry: Not on file    Inability: Not on file  . Transportation needs:    Medical: Not on file    Non-medical: Not on file  Tobacco Use  . Smoking status: Never Smoker  . Smokeless tobacco: Never Used  Substance and Sexual Activity  . Alcohol use: No    Alcohol/week: 0.0 oz  . Drug use: No  . Sexual activity: Yes  Lifestyle  . Physical activity:    Days per week: Not on file    Minutes  per session: Not on file  . Stress: Not on file  Relationships  . Social connections:    Talks on phone: Not on file    Gets together: Not on file    Attends religious service: Not on file    Active member of club or organization: Not on file    Attends meetings of clubs or organizations: Not on file    Relationship status: Not on file  . Intimate partner violence:    Fear of current or ex partner: Not on file    Emotionally abused: Not on file    Physically abused: Not on file    Forced sexual activity: Not on file  Other Topics Concern  . Not on file  Social History Narrative   Married.    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     Vitals:   09/29/17 1538  BP: 136/76  Pulse: 78  SpO2: 98%  Weight: 231 lb (104.8 kg)  Height: 6' (1.829 m)    Wt Readings from Last 3 Encounters:  09/29/17 231 lb (104.8 kg)  09/28/17 232 lb (105.2 kg)  08/31/17 237 lb 2 oz (107.6 kg)     PHYSICAL EXAM General: NAD HEENT: Normal. Neck: No JVD, no thyromegaly. Lungs: Clear to auscultation bilaterally with normal respiratory effort. CV: Regular rate and rhythm, normal S1/S2, no S3/S4, no murmur. No pretibial or periankle edema.  No carotid bruit.   Abdomen: Soft, nontender, no distention.  Neurologic: Alert and oriented.  Psych: Normal affect. Skin: Normal. Musculoskeletal: No gross deformities.    ECG: Most recent ECG reviewed.   Labs: Lab Results  Component Value Date/Time   K 4.3 03/08/2017 09:39 AM   BUN 13 03/08/2017 09:39 AM   CREATININE 1.18 03/08/2017 09:39 AM   ALT 11 03/08/2017 09:39 AM   TSH 2.30 03/08/2017 09:39 AM   HGB 15.2 03/08/2017 09:39 AM     Lipids: Lab Results  Component Value Date/Time   LDLCALC 94 03/08/2017 09:39 AM   CHOL 153 03/08/2017 09:39 AM   TRIG 63 03/08/2017 09:39 AM   HDL  44 03/08/2017 09:39 AM       ASSESSMENT AND PLAN: 1.  Accelerated hypertension: Blood pressure is normal today with systolic readings generally in the 130 range on current therapy.  He is no longer experiencing headaches and fatigue.  He would like to remain on his current medications which include amlodipine, hydralazine, labetalol,  lisinopril, and clonidine.  2.  Aortic root dilatation: Most recent chest CT reviewed above from August 2017 measuring 4.2 cm in diameter.  He prefers to avoid further testing at this time.  3.  Obesity: He is already lost 26 pounds and would like to get down to 195 pounds with diet and exercise.  He hopes to reduce the amount of antihypertensive therapy is currently taking.   Disposition: Follow up 6 months as per his preference   Kate Sable, M.D., F.A.C.C.

## 2017-10-09 ENCOUNTER — Emergency Department (HOSPITAL_COMMUNITY)
Admission: EM | Admit: 2017-10-09 | Discharge: 2017-10-09 | Disposition: A | Discharge status: Home / Self Care | Payer: 59 | Attending: Emergency Medicine | Admitting: Emergency Medicine

## 2017-10-09 ENCOUNTER — Emergency Department (HOSPITAL_COMMUNITY): Payer: 59

## 2017-10-09 ENCOUNTER — Encounter (HOSPITAL_COMMUNITY): Payer: Self-pay | Admitting: *Deleted

## 2017-10-09 ENCOUNTER — Other Ambulatory Visit: Payer: Self-pay

## 2017-10-09 DIAGNOSIS — Z79899 Other long term (current) drug therapy: Secondary | ICD-10-CM | POA: Diagnosis not present

## 2017-10-09 DIAGNOSIS — R079 Chest pain, unspecified: Secondary | ICD-10-CM | POA: Diagnosis present

## 2017-10-09 DIAGNOSIS — I77810 Thoracic aortic ectasia: Secondary | ICD-10-CM | POA: Diagnosis not present

## 2017-10-09 DIAGNOSIS — R0789 Other chest pain: Secondary | ICD-10-CM

## 2017-10-09 DIAGNOSIS — I10 Essential (primary) hypertension: Secondary | ICD-10-CM | POA: Diagnosis not present

## 2017-10-09 LAB — CBC WITH DIFFERENTIAL/PLATELET
Basophils Absolute: 0 10*3/uL (ref 0.0–0.1)
Basophils Relative: 0 %
Eosinophils Absolute: 0.3 10*3/uL (ref 0.0–0.7)
Eosinophils Relative: 2 %
HEMATOCRIT: 42.3 % (ref 39.0–52.0)
Hemoglobin: 14 g/dL (ref 13.0–17.0)
LYMPHS ABS: 1.4 10*3/uL (ref 0.7–4.0)
Lymphocytes Relative: 12 %
MCH: 29.7 pg (ref 26.0–34.0)
MCHC: 33.1 g/dL (ref 30.0–36.0)
MCV: 89.6 fL (ref 78.0–100.0)
MONO ABS: 1.1 10*3/uL — AB (ref 0.1–1.0)
MONOS PCT: 9 %
NEUTROS PCT: 77 %
Neutro Abs: 9 10*3/uL — ABNORMAL HIGH (ref 1.7–7.7)
Platelets: 256 10*3/uL (ref 150–400)
RBC: 4.72 MIL/uL (ref 4.22–5.81)
RDW: 13.2 % (ref 11.5–15.5)
WBC: 11.7 10*3/uL — ABNORMAL HIGH (ref 4.0–10.5)

## 2017-10-09 LAB — I-STAT TROPONIN, ED
Troponin i, poc: 0.01 ng/mL (ref 0.00–0.08)
Troponin i, poc: 0.02 ng/mL (ref 0.00–0.08)

## 2017-10-09 LAB — BASIC METABOLIC PANEL
Anion gap: 9 (ref 5–15)
BUN: 17 mg/dL (ref 6–20)
CO2: 25 mmol/L (ref 22–32)
CREATININE: 1.22 mg/dL (ref 0.61–1.24)
Calcium: 8.8 mg/dL — ABNORMAL LOW (ref 8.9–10.3)
Chloride: 107 mmol/L (ref 101–111)
GFR calc Af Amer: >60 mL/min (ref >60)
GLUCOSE: 124 mg/dL — AB (ref 65–99)
POTASSIUM: 3.7 mmol/L (ref 3.5–5.1)
SODIUM: 141 mmol/L (ref 135–145)

## 2017-10-09 MED ORDER — MORPHINE SULFATE (PF) 4 MG/ML IV SOLN
4.0000 mg | Freq: Once | INTRAVENOUS | Status: AC
  Administered 2017-10-09: 4 mg via INTRAVENOUS
  Filled 2017-10-09: qty 1

## 2017-10-09 MED ORDER — GI COCKTAIL ~~LOC~~
30.0000 mL | Freq: Once | ORAL | Status: AC
  Administered 2017-10-09: 30 mL via ORAL
  Filled 2017-10-09: qty 30

## 2017-10-09 MED ORDER — IOPAMIDOL (ISOVUE-370) INJECTION 76%
100.0000 mL | Freq: Once | INTRAVENOUS | Status: AC | PRN
  Administered 2017-10-09: 100 mL via INTRAVENOUS

## 2017-10-09 MED ORDER — ASPIRIN 81 MG PO CHEW
324.0000 mg | CHEWABLE_TABLET | Freq: Once | ORAL | Status: AC
  Administered 2017-10-09: 324 mg via ORAL
  Filled 2017-10-09: qty 4

## 2017-10-09 NOTE — ED Triage Notes (Signed)
Pt states he woke up around 4am with chest pain to center of chest that radiated down left arm; pt states he feels sob with dizziness and weakness; pt states he woke up with the pain and was sweating

## 2017-10-09 NOTE — ED Provider Notes (Signed)
[facility] EMERGENCY DEPARTMENT Provider Note   CSN: [id number] Arrival date & time: 10/09/17  0109     History   Chief Complaint Chief Complaint  Patient presents with  . Chest Pain    HPI George Pratt is a 57 y.o. male.  The history is provided by the patient.  He has a history of hypertension and dilated aortic root and comes in complaining of chest pain radiating to his left arm.  Pain woke him up at about 4 AM.  He describes it as being sharp.  It is worse when laying down, better when sitting up or standing up.  Pain was initially 10/10, but has decreased to 8/10.  He was diaphoretic when he woke up.  There was mild dyspnea but no nausea.  He has not done anything to treat it.  He is a non-smoker.  He does have history of hypertension but no history of diabetes or hyperlipidemia.  No family history of premature coronary atherosclerosis.  Past Medical History:  Diagnosis Date  . Hypertension   . Sleep apnea     Patient Active Problem List   Diagnosis Date Noted  . Pneumonia due to adenovirus 09/28/2017  . Essential hypertension 08/14/2017  . OSA (obstructive sleep apnea) 08/02/2017  . Chronic constipation 01/14/2015  . Prostate cancer screening 01/14/2015  . Screening for colorectal cancer 01/14/2015  . Fatigue 01/14/2015  . Aortic root dilatation ([place]) 07/14/2014  . Chest pain 02/27/2014  . HTN (hypertension), malignant 02/27/2014    Past Surgical History:  Procedure Laterality Date  . APPENDECTOMY  2006  . COLONOSCOPY N/A 10/01/2015   Procedure: COLONOSCOPY;  Surgeon: Rogene Houston, MD;  Location: [facility];  Service: Endoscopy;  Laterality: N/A;  1030  . KNEE SURGERY    . NECK SURGERY  2000        Home Medications    Prior to Admission medications   Medication Sig Start Date End Date Taking? Authorizing Provider  amLODipine (NORVASC) 10 MG tablet TAKE 1 TABLET BY MOUTH  DAILY 05/01/17   Herminio Commons, MD  aspirin EC 81 MG tablet Take 1  tablet (81 mg total) by mouth daily. 10/08/15   Rehman, Mechele Dawley, MD  baclofen (LIORESAL) 10 MG tablet Take 1 tablet (10 mg total) by mouth 3 (three) times daily. 08/14/17 08/14/18  Orlena Sheldon, PA-C  cloNIDine (CATAPRES) 0.1 MG tablet Take 1 tablet (0.1 mg total) by mouth 3 (three) times daily. 08/18/17   Strader, Fransisco Hertz, PA-C  hydrALAZINE (APRESOLINE) 50 MG tablet Take 1.5 tablets (75 mg total) by mouth 3 (three) times daily. 08/18/17 11/16/17  Strader, Fransisco Hertz, PA-C  labetalol (NORMODYNE) 100 MG tablet Take 1 tablet by mouth two  times daily 05/13/15   Herminio Commons, MD  linaclotide [facility]) 72 MCG capsule Take 1 capsule (72 mcg total) by mouth daily before breakfast. Patient taking differently: Take 72 mcg by mouth daily as needed.  04/08/16   Dena Billet B, PA-C  lisinopril (PRINIVIL,ZESTRIL) 40 MG tablet TAKE 1 TABLET BY MOUTH  DAILY 05/01/17   Herminio Commons, MD  Multiple Vitamins-Minerals (ONE-A-DAY MENS 50+ ADVANTAGE PO) Take 1 tablet by mouth daily.    [provider]  traMADol (ULTRAM) 50 MG tablet Take 1 tablet (50 mg total) by mouth every 6 (six) hours as needed. 01/20/16   Milton Ferguson, MD    Family History Family History  Problem Relation Age of Onset  . Hypertension Mother   .  Heart failure Mother   . Heart disease Mother   . Hypertension Father   . Diabetes Father   . Vision loss Father   . Hypertension Brother   . Hypertension Brother   . Hypertension Brother     Social History Social History   Tobacco Use  . Smoking status: Never Smoker  . Smokeless tobacco: Never Used  Substance Use Topics  . Alcohol use: No    Alcohol/week: 0.0 oz  . Drug use: No     Allergies   Bee venom   Review of Systems Review of Systems  All other systems reviewed and are negative.    Physical Exam Updated Vital Signs BP (!) 187/94 (BP Location: Left Arm)   Pulse 96   Temp 99 F (37.2 C) (Oral)   Resp (!) 22   Ht 6' (1.829 m)   Wt 104.8 kg  (231 lb)   SpO2 97%   BMI 31.33 kg/m   Physical Exam  Nursing note and vitals reviewed.  57 year old male, resting comfortably and in no acute distress. Vital signs are significant for elevated blood pressure. Oxygen saturation is 97%, which is normal. Head is normocephalic and atraumatic. PERRLA, EOMI. Oropharynx is clear. Neck is nontender and supple without adenopathy or JVD. Back is nontender and there is no CVA tenderness. Lungs are clear without rales, wheezes, or rhonchi. Chest is nontender. Heart has regular rate and rhythm without murmur. Abdomen is soft, flat, with mild epigastric tenderness.  There is no rebound or guarding.  There are no masses or hepatosplenomegaly and peristalsis is normoactive. Extremities have no cyanosis or edema, full range of motion is present. Skin is warm and dry without rash. Neurologic: Mental status is normal, cranial nerves are intact, there are no motor or sensory deficits.  ED Treatments / Results  Labs (all labs ordered are listed, but only abnormal results are displayed) Labs Reviewed - No data to display  EKG None  Radiology No results found.  Procedures Procedures   Medications Ordered in ED Medications - No data to display   Initial Impression / Assessment and Plan / ED Course  I have reviewed the triage vital signs and the nursing notes.  Pertinent labs & imaging results that were available during my care of the patient were reviewed by me and considered in my medical decision making (see chart for details).  Chest pain which seems very atypical for cardiac pain.  With epigastric tenderness and worse when supine, suspect GERD.  While he does have history of dilated aortic root, doubt aortic dissection as cause of his pain.  ECG shows no acute changes.  Screening labs are obtained.  He is given a dose of aspirin and also a GI cocktail.  No relief from GI cocktail.  He is given a dose of morphine.  Initial troponin is  negative, will hold for repeat troponin.  Case is signed out to Dr. Reather Converse.  Final Clinical Impressions(s) / ED Diagnoses   Final diagnoses:  Atypical chest pain    ED Discharge Orders    None       Delora Fuel, MD 85/63/14 [phone]

## 2017-10-09 NOTE — Discharge Instructions (Signed)
Follow up with heart doctor.  If you were given medicines take as directed.  If you are on coumadin or contraceptives realize their levels and effectiveness is altered by many different medicines.  If you have any reaction (rash, tongues swelling, other) to the medicines stop taking and see a physician.    If your blood pressure was elevated in the ER make sure you follow up for management with a primary doctor or return for chest pain, shortness of breath or stroke symptoms.  Please follow up as directed and return to the ER or see a physician for new or worsening symptoms.  Thank you. Vitals:   10/09/17 0700 10/09/17 0730 10/09/17 0800 10/09/17 1008  BP: (!) 149/76 134/71 137/73 (!) 158/92  Pulse: 78 70 72 73  Resp: 20 17 20 17   Temp:      TempSrc:      SpO2: 94% 95% 96% 95%  Weight:      Height:

## 2017-10-16 ENCOUNTER — Ambulatory Visit (INDEPENDENT_AMBULATORY_CARE_PROVIDER_SITE_OTHER): Payer: 59 | Admitting: Physician Assistant

## 2017-10-16 ENCOUNTER — Encounter: Payer: Self-pay | Admitting: Physician Assistant

## 2017-10-16 ENCOUNTER — Other Ambulatory Visit: Payer: Self-pay

## 2017-10-16 VITALS — BP 150/90 | HR 69 | Temp 98.4°F | Wt 226.2 lb

## 2017-10-16 DIAGNOSIS — I10 Essential (primary) hypertension: Secondary | ICD-10-CM

## 2017-10-16 DIAGNOSIS — F411 Generalized anxiety disorder: Secondary | ICD-10-CM | POA: Diagnosis not present

## 2017-10-16 DIAGNOSIS — G4733 Obstructive sleep apnea (adult) (pediatric): Secondary | ICD-10-CM

## 2017-10-16 DIAGNOSIS — Z09 Encounter for follow-up examination after completed treatment for conditions other than malignant neoplasm: Secondary | ICD-10-CM

## 2017-10-16 DIAGNOSIS — I77810 Thoracic aortic ectasia: Secondary | ICD-10-CM | POA: Diagnosis not present

## 2017-10-16 MED ORDER — SERTRALINE HCL 50 MG PO TABS: 50.0000 mg | ORAL_TABLET | Freq: Every day | ORAL | 1 refills | Status: DC

## 2017-10-16 NOTE — Progress Notes (Signed)
Patient ID: George Pratt MRN: [id number], DOB: [birth year], 57 y.o. Date of Encounter: @DATE @  Chief Complaint:  Chief Complaint  Patient presents with  . Hospitalization Follow-up    Patient in today for a hospital follow up for atypical chest pain    HPI: 57 y.o. year old male  presents with above.   Today I have reviewed his recent ER note dated 10/09/2017. That time he presented with complaints of chest pain radiating to left arm.  Pain woke him at about 4 AM.  Described it as being sharp.  Was worse when lying down, better when sitting up or standing up.  Pain was initially 10/10 but had decreased to 8/10.  Ported that he had been diaphoretic when he woke up.  Had some mild dyspnea but no nausea.  Had done nothing to treat it.  Not a smoker.  Noted that he does have heart history of hypertension but no history of diabetes or hyperlipidemia.  No family history of premature CAD.  At that he does have history of hypertension as well as dilated aortic root.  Troponin was negative. EKG showed no significant abnormality. CT angiogram was performed.  No evidence of aortic dissection.  Slight dilation of aortic root at 4.0 cm, stable.  No acute findings in chest abdomen or pelvis.  Today I also reviewed his last note by cardiology on 09/29/2017.  Today I also reviewed his note by Dr. Brett Fairy at [facility] neurology regarding sleep apnea and that note was dated 09/28/2017.  Today patient reports that he has a follow-up appointment scheduled to cardiology for June 3.  Also reports that he does have follow-up scheduled to have sleep study.  Today his wife accompanies him for visit. Today patient states that he feels like he has been having problems with concentration and focus and states that he has had increased irritability.  Wife also adds comments regarding increased irritability.  Husband as and that he always feels stressed and anxious and irritable and that makes him feel like he is in a  fog.  Says that he really has not felt like he wants to do anything recently.  Discussed that his blood pressure fluctuates.  Says that it goes down to 125 after he takes his medicines but then at the end of the day it goes up towards 150.  That he does not know whether these blood pressure fluctuations are contributing to symptoms.  We also reviewed that some of the blood pressure medications especially clonidine could be contributing to his fatigue and associated symptoms. However we also discussed that he is on maximum doses of multiple classes of blood pressure medicines and that there is not going to be a lot of options regarding adjusting his BP meds and that we will leave that to cardiology.  Also discussed that if he does have sleep apnea, is put on treatment for this that this would also improve the symptoms that are frequently sleep apnea can cause problems with decreased concentration and focus and increased irritability.  Discussed waiting for him to have follow-up with cardiology and seeing if they will adjust some medications and also discussed waiting for him to have follow-up with Dr. Brett Fairy to see if he ends up getting treatment for sleep apnea and whether all of the symptoms improve with these changes.  I did discuss possibility of adding a medication such as Zoloft to see if this helps with his anxiety, feeling of stress, irritability.  Ultimately after  all of this discussion he does agree that he feels that he would benefit from adding Zoloft in addition to following up with cardiology and sleep study.   Past Medical History:  Diagnosis Date  . Hypertension   . Sleep apnea      Home Meds: Outpatient Medications Prior to Visit  Medication Sig Dispense Refill  . aspirin EC 81 MG tablet Take 1 tablet (81 mg total) by mouth daily.    . cloNIDine (CATAPRES) 0.1 MG tablet Take 1 tablet (0.1 mg total) by mouth 3 (three) times daily. (Patient taking differently: Take 0.1 mg by  mouth 2 (two) times daily. ) 90 tablet 11  . hydrALAZINE (APRESOLINE) 50 MG tablet Take 1.5 tablets (75 mg total) by mouth 3 (three) times daily. 405 tablet 3  . labetalol (NORMODYNE) 100 MG tablet Take 1 tablet by mouth two  times daily 180 tablet 3  . linaclotide (LINZESS) 72 MCG capsule Take 1 capsule (72 mcg total) by mouth daily before breakfast. (Patient taking differently: Take 72 mcg by mouth daily as needed. ) 90 capsule 0  . lisinopril (PRINIVIL,ZESTRIL) 40 MG tablet TAKE 1 TABLET BY MOUTH  DAILY 90 tablet 1  . Multiple Vitamins-Minerals (ONE-A-DAY MENS 50+ ADVANTAGE PO) Take 1 tablet by mouth daily.    . traMADol (ULTRAM) 50 MG tablet Take 1 tablet (50 mg total) by mouth every 6 (six) hours as needed. 15 tablet 0  . amLODipine (NORVASC) 10 MG tablet TAKE 1 TABLET BY MOUTH  DAILY 90 tablet 1  . baclofen (LIORESAL) 10 MG tablet Take 1 tablet (10 mg total) by mouth 3 (three) times daily. (Patient taking differently: Take 10 mg by mouth daily as needed for muscle spasms. ) 90 tablet 1   No facility-administered medications prior to visit.     Allergies:  Allergies  Allergen Reactions  . Bee Venom Swelling    Social History   Socioeconomic History  . Marital status: Married    Spouse name: Not on file  . Number of children: Not on file  . Years of education: Not on file  . Highest education level: Not on file  Occupational History  . Not on file  Social Needs  . Financial resource strain: Not on file  . Food insecurity:    Worry: Not on file    Inability: Not on file  . Transportation needs:    Medical: Not on file    Non-medical: Not on file  Tobacco Use  . Smoking status: Never Smoker  . Smokeless tobacco: Never Used  Substance and Sexual Activity  . Alcohol use: No    Alcohol/week: 0.0 oz  . Drug use: No  . Sexual activity: Yes  Lifestyle  . Physical activity:    Days per week: Not on file    Minutes per session: Not on file  . Stress: Not on file    Relationships  . Social connections:    Talks on phone: Not on file    Gets together: Not on file    Attends religious service: Not on file    Active member of club or organization: Not on file    Attends meetings of clubs or organizations: Not on file    Relationship status: Not on file  . Intimate partner violence:    Fear of current or ex partner: Not on file    Emotionally abused: Not on file    Physically abused: Not on file    Forced sexual activity:  Not on file  Other Topics Concern  . 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    Family History  Problem Relation Age of Onset  . Hypertension Mother   . Heart failure Mother   . Heart disease Mother   . Hypertension Father   . Diabetes Father   . Vision loss Father   . Hypertension Brother   . Hypertension Brother   . Hypertension Brother      Review of Systems:  See HPI for pertinent ROS. All other ROS negative.    Physical Exam: Blood pressure (!) 150/90, pulse 69, temperature 98.4 F (36.9 C), temperature source Oral, weight 102.6 kg (226 lb 4 oz), SpO2 98 %., Body mass index is 30.69 kg/m. General: WNWD WM. Appears in no acute distress. Neck: Supple. No thyromegaly. No lymphadenopathy. Lungs: Clear bilaterally to auscultation without wheezes, rales, or rhonchi. Breathing is unlabored. Heart: RRR with S1 S2. No murmurs, rubs, or gallops. Abdomen: Soft, non-tender, non-distended with normoactive bowel sounds. No hepatomegaly. No rebound/guarding. No obvious abdominal masses. Musculoskeletal:  Strength and tone normal for age. Extremities/Skin: Warm and dry.  No LE edema.  Neuro: Alert and oriented X 3. Moves all extremities spontaneously. Gait is normal. CNII-XII grossly in tact. Psych:  Responds to  questions appropriately with a normal affect.     ASSESSMENT AND PLAN:  57 y.o. year old male with   1. Hospital discharge follow-up > 30 minutes spent in direct face-to-face interaction with the patient and his wife in direct conversation with them reviewing notes from the ER, CT report, notes from Dr. Brett Fairy, notes from cardiology.  2. Generalized anxiety disorder Discussed proper expectations of medication.  If feels that medication is causing adverse effect then he is to stop the medication and call us.  Otherwise even if he is not seeing much effect from medication he is to continue taking it until follow-up visit in 6 weeks.  Discussed that this does take time to become effective. - sertraline (ZOLOFT) 50 MG tablet; Take 1 tablet (50 mg total) by mouth daily.  Dispense: 30 tablet; Refill: 1  3. HTN (hypertension), malignant He is on multiple blood pressure medications.  He has follow-up appointment with cardiology scheduled for June 3 so we will wait for them to make any further adjustments in medication.  4. Aortic root dilatation [facility]) -- Management Per Cardiology.  Was stable on recent CT angiogram.  5. OSA (obstructive sleep apnea) This is being evaluated by Dr. Brett Fairy and he is to follow-up with sleep study with her.  He will schedule follow-up visit with me in 6 weeks and will follow up all of the above at that visit.  Follow-up sooner if needed.   Marin Olp Bryant, [place], [facility] 10/16/2017 4:42 PM

## 2017-10-17 ENCOUNTER — Telehealth: Payer: Self-pay | Admitting: Cardiovascular Disease

## 2017-10-17 NOTE — Telephone Encounter (Signed)
Patients wife called stating that he is not feeling well.    That he has not been able to work due to his BP being so high.   If you don't get patient call his wife

## 2017-10-17 NOTE — Telephone Encounter (Signed)
Patient saw pcp yesterday (see note) ,had BP 150/90, was place on zoloft.Patient will keep apt scheduled for 6/3 and will bring BP readings to apt

## 2017-10-18 ENCOUNTER — Ambulatory Visit (INDEPENDENT_AMBULATORY_CARE_PROVIDER_SITE_OTHER): Payer: 59 | Admitting: Neurology

## 2017-10-18 DIAGNOSIS — G4733 Obstructive sleep apnea (adult) (pediatric): Secondary | ICD-10-CM

## 2017-10-18 DIAGNOSIS — J120 Adenoviral pneumonia: Secondary | ICD-10-CM

## 2017-10-18 DIAGNOSIS — E669 Obesity, unspecified: Secondary | ICD-10-CM

## 2017-10-20 NOTE — Addendum Note (Signed)
Addended by: Larey Seat on: 10/20/2017 10:55 AM   Modules accepted: Orders

## 2017-10-20 NOTE — Procedures (Signed)
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 Doren Custard for evaluation of sleep apnea. The patient had recently overcome pneumonia.  Mr. Groman father was a pediatrician in [place] and treated him for some time for any medical problems.  He does have a primary care provider ( referring)  and a cardiologist whom he sees routinely.  In March of this year he reported that his blood pressures have been "all over the place" and had been unusually elevated and was diagnosed with pneumonia.  He also has a dilated aortic arch and knows that high blood pressures can make it worse.  He has been treated for obstructive sleep apnea by CPAP since 2013 and had no evaluation of sleep apnea since and no follow-up.   He has not been routinely using CPAP, but is now worried that sleep apnea may be contributing to his hypertension.    Epworth Sleepiness Score endorsed at 04/ 24 points,  Fatigue severity score at 19/ 63 points.   The BMI is 31.4  STUDY RESULTS:  Total Recording Time:  7 hours, 56 minutes.  Total Apnea/Hypopnea Index (AHI): 9.1 /h, RDI: 11.3 /h. Average Oxygen Saturation: 94 %, Lowest Oxygen Desaturation: 87%. Total Time Oxygen Saturation below 89% %: 1.0 minute.  Average Heart Rate:  60 bpm (between 52 and 89 bpm).  IMPRESSION: Mild sleep apnea that can be treated with CPAP but could also respond to weight loss or a dental device.  Snoring is moderately loud.  No associated hypoxemia and no arrhythmia.   RECOMMENDATION: In a patient used to CPAP and not claustrophobic I  would like to use an auto-titration device between 5-12 cm water, 3 cm EPR and a mask of his choice.   I certify that I have reviewed the raw data recording prior to the issuance of this report in accordance with the standards of the American Academy of Sleep Medicine (AASM).   Larey Seat, M.D.     10-20-2017       Medical Director of [facility] at [facility], accredited by the AASM. Diplomat of the ABPN and ABSM.

## 2017-10-24 ENCOUNTER — Telehealth: Payer: Self-pay | Admitting: Neurology

## 2017-10-24 NOTE — Telephone Encounter (Signed)
-----   Message from Larey Seat, MD sent at 10/20/2017 10:54 AM EDT ----- IMPRESSION: Mild sleep apnea that can be treated with CPAP but  could also respond to weight loss or a dental device.  Snoring is moderately loud.  No associated hypoxemia and no arrhythmia.   RECOMMENDATION: In a patient used to CPAP and not claustrophobic  I would like to use an auto-titration device between 5-12 cm  water, 3 cm EPR and a mask of his choice.

## 2017-10-24 NOTE — Telephone Encounter (Signed)
Called patient to discuss sleep study results. No answer at this time. LVM for the patient to call back.   

## 2017-10-24 NOTE — Telephone Encounter (Signed)
Pt returned call.  I advised pt that Dr. Brett Fairy reviewed their sleep study results and found that pt has sleep apnea. Dr. Brett Fairy recommends that pt starts a auto CPAP. I reviewed PAP compliance expectations with the pt. Pt is agreeable to starting a CPAP. I advised pt that an order will be sent to a DME, [facility], and [facility] will call the pt within about one week after they file with the pt's insurance. [facility] will show the pt how to use the machine, fit for masks, and troubleshoot the CPAP if needed. A follow up appt was made for insurance purposes with Dr. Brett Fairy on Jan 17, 2018 at 11:00 am. Pt verbalized understanding to arrive 15 minutes early and bring their CPAP. A letter with all of this information in it will be mailed to the pt as a reminder. I verified with the pt that the address we have on file is correct. Pt verbalized understanding of results. Pt had no questions at this time but was encouraged to call back if questions arise.

## 2017-10-25 NOTE — Progress Notes (Signed)
Cardiology Office Note    Date:  10/30/2017   ID:  Haik, Mahoney [birth year], MRN [id number]  PCP:  Orlena Sheldon, PA-C  Cardiologist: Kate Sable, MD  Chief Complaint  Patient presents with  . Hospitalization Follow-up    History of Present Illness:  George Pratt is a 57 y.o. male with history of accelerated hypertension, aortic root dilatation 4.2 cm in diameter CT 12/2015-deferred further testing at office visit with Dr. Bronson Ing 09/29/2017, obesity and had lost 26 pounds.  He was in the ED 10/09/2017 with atypical chest pain in the epigastric tenderness and worse with laying down. Not relieved with GI cocktail and given a dose of morphine.  CT showed no evidence of aortic dissection with slight dilatation of the aortic root at 4.0 cm which was stable.  No acute findings.  Troponin negative.  He then saw his PCP and was complaining of fatigue that could be secondary to maximum doses of blood pressure medications and wanted further dosing to be done by cardiology.  Zoloft was added to help with stress and anxiety.  He also had a sleep study and was found to have obstructive sleep apnea and is going to be started on CPAP.  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 [employer] forms filled out.Complains of chest tightness with left arm tingling when he is sleeping that can last hours and gets relief from sleeping upright. Hasn't started CPAP yet but to begin Friday. Rides exercise bike twice a day without chest tightness. BP's have been fluctuating at home with some high readings in am. Says he's not eating salt but is eating hot dogs and pre-made stir fry.   Past Medical History:  Diagnosis Date  . Hypertension   . Sleep apnea     Past Surgical History:  Procedure Laterality Date  . APPENDECTOMY  2006  . COLONOSCOPY N/A 10/01/2015   Procedure: COLONOSCOPY;  Surgeon: Rogene Houston, MD;   Location: [facility];  Service: Endoscopy;  Laterality: N/A;  1030  . KNEE SURGERY    . NECK SURGERY  2000    Current Medications: Current Meds  Medication Sig  . amLODipine (NORVASC) 10 MG tablet TAKE 1 TABLET BY MOUTH  DAILY  . aspirin EC 81 MG tablet Take 1 tablet (81 mg total) by mouth daily.  . baclofen (LIORESAL) 10 MG tablet Take 1 tablet (10 mg total) by mouth 3 (three) times daily. (Patient taking differently: Take 10 mg by mouth daily as needed for muscle spasms. )  . cloNIDine (CATAPRES) 0.1 MG tablet Take 1 tablet (0.1 mg total) by mouth 3 (three) times daily. (Patient taking differently: Take 0.1 mg by mouth 2 (two) times daily. )  . hydrALAZINE (APRESOLINE) 50 MG tablet Take 1.5 tablets (75 mg total) by mouth 3 (three) times daily.  Marland Kitchen labetalol (NORMODYNE) 100 MG tablet Take 1 tablet by mouth two  times daily  . linaclotide (LINZESS) 72 MCG capsule Take 1 capsule (72 mcg total) by mouth daily before breakfast. (Patient taking differently: Take 72 mcg by mouth daily as needed. )  . lisinopril (PRINIVIL,ZESTRIL) 40 MG tablet TAKE 1 TABLET BY MOUTH  DAILY  . Multiple Vitamins-Minerals (ONE-A-DAY MENS 50+ ADVANTAGE PO) Take 1 tablet by mouth daily.  . sertraline (ZOLOFT) 50 MG tablet Take 1 tablet (50 mg total) by mouth daily.  . traMADol (ULTRAM) 50 MG tablet  Take 1 tablet (50 mg total) by mouth every 6 (six) hours as needed.     Allergies:   Bee venom   Social History   Socioeconomic History  . Marital status: Married    Spouse name: Not on file  . Number of children: Not on file  . Years of education: Not on file  . Highest education level: Not on file  Occupational History  . Not on file  Social Needs  . Financial resource strain: Not on file  . Food insecurity:    Worry: Not on file    Inability: Not on file  . Transportation needs:    Medical: Not on file    Non-medical: Not on file  Tobacco Use  . Smoking status: Never Smoker  . Smokeless tobacco: Never  Used  Substance and Sexual Activity  . Alcohol use: No    Alcohol/week: 0.0 oz  . Drug use: No  . Sexual activity: Yes  Lifestyle  . Physical activity:    Days per week: Not on file    Minutes per session: Not on file  . Stress: Not on file  Relationships  . Social connections:    Talks on phone: Not on file    Gets together: Not on file    Attends religious service: Not on file    Active member of club or organization: Not on file    Attends meetings of clubs or organizations: Not on file    Relationship status: Not on file  Other Topics Concern  . Not on file  Social History Narrative   Married.    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    --Is available for students to call him, email him, etc   Says it is for a group of multiple colleges--in [employer], etc.     Family History:  The patient's family history includes Diabetes in his father; Heart disease in his mother; Heart failure in his mother; Hypertension in his brother, brother, brother, father, and mother; Vision loss in his father.   ROS:   Please see the history of present illness.    Review of Systems  Constitution: Negative.  HENT: Negative.   Cardiovascular: Positive for chest pain and dyspnea on exertion.  Respiratory: Positive for sleep disturbances due to breathing and snoring.   Endocrine: Negative.   Hematologic/Lymphatic: Negative.   Musculoskeletal: Negative.   Gastrointestinal: Negative.   Genitourinary: Negative.   Neurological: Positive for light-headedness.   All other systems reviewed and are negative.   PHYSICAL EXAM:   VS:  BP 124/72   Pulse 66   Ht 6\' 1"  (1.854 m)   Wt 224 lb (101.6 kg)   SpO2 98%   BMI 29.55 kg/m   Physical Exam  GEN: Well nourished, well developed, in no acute distress  Neck: no JVD, carotid bruits, or masses Cardiac:RRR; no murmurs, rubs, or gallops  Respiratory:  clear to auscultation bilaterally, normal work of  breathing GI: soft, nontender, nondistended, + BS Ext: without cyanosis, clubbing, or edema, Good distal pulses bilaterally Neuro:  Alert and Oriented x 3 Psych: euthymic mood, full affect  Wt Readings from Last 3 Encounters:  10/30/17 224 lb (101.6 kg)  10/16/17 226 lb 4 oz (102.6 kg)  10/09/17 231 lb (104.8 kg)      Studies/Labs Reviewed:   EKG:  EKG is not ordered today.   Recent Labs: 03/08/2017: ALT 11; TSH 2.30 10/09/2017: BUN 17; Creatinine, Ser  1.22; Hemoglobin 14.0; Platelets 256; Potassium 3.7; Sodium 141   Lipid Panel    Component Value Date/Time   CHOL 153 03/08/2017 0939   TRIG 63 03/08/2017 0939   HDL 44 03/08/2017 0939   CHOLHDL 3.5 03/08/2017 0939   VLDL 13 03/03/2016 0912   LDLCALC 94 03/08/2017 0939    Additional studies/ records that were reviewed today include:  Echocardiogram: 02/2014 Study Conclusions  - Procedure narrative: Transthoracic echocardiography. Image   quality was suboptimal. The study was technically difficult, as a   result of poor sound wave transmission. - Left ventricle: The cavity size was normal. Systolic function was   normal. The estimated ejection fraction was in the range of 60%   to 65%. Wall motion was normal; there were no regional wall   motion abnormalities. Doppler parameters are consistent with   abnormal left ventricular relaxation (grade 1 diastolic   dysfunction). Mild to moderate concentric LVH. - Aorta: Mild aortic root dilatation. Aortic root dimension: 44 mm   (ED).   CT of the chest 5/15/2019IMPRESSION: No evidence of aortic dissection. Slight dilatation of the aortic root at 4.0 cm, stable.   Dependent and bibasilar atelectasis.   Scattered hepatic and left renal cysts.   Scattered colonic diverticula.   Mildly prominent prostate.   No acute findings in the chest, abdomen or pelvis.     Electronically Signed   By: Rolm Baptise M.D.   On: 10/09/2017 09:50     ASSESSMENT:    1. HTN  (hypertension), malignant   2. Aortic root dilatation (HCC)   3. OSA (obstructive sleep apnea)   4. Chest pain, unspecified type      PLAN:  In order of problems listed above:  Essential hypertension on amlodipine hydralazine and labetalol, lisinopril and clonidine.  Blood pressure stable today but was elevated this morning on his cuff 145/95.  Starts CPAP on Friday.  This may improve his blood pressure.  Of asked him to take his amlodipine in the evening to see if this helps and 2 g sodium diet.  Follow-up with Dr. Bronson Ing for further medicine titration.  Aortic root dilatation recent CT when in the emergency room 10/11/2017 and mild dilatation of the aortic root at 4.0 cm  OSA to start CPAP on Friday  Chest pain described as tightness into his left arm when he lays down at night and wakes him from sleep.  Can last hours.  Suspect it may be secondary to sleep apnea but with risk factors will order stress Myoview.  He had one in 2015 to compare to.   Medication Adjustments/Labs and Tests Ordered: Current medicines are reviewed at length with the patient today.  Concerns regarding medicines are outlined above.  Medication changes, Labs and Tests ordered today are listed in the Patient Instructions below. Patient Instructions  Medication Instructions:  Your physician recommends that you continue on your current medications as directed. Please refer to the Current Medication list given to you today.  Take your Norvasc At Night   Labwork: NONE   Testing/Procedures: Your physician has requested that you have en exercise stress myoview. For further information please visit [email or web address]. Please follow instruction sheet, as given.    Follow-Up: Your physician recommends that you schedule a follow-up appointment with Dr. Bronson Ing after Testing.    Any Other Special Instructions Will Be Listed Below (If Applicable).     If you need a refill on your cardiac medications  before your next appointment, please call your  pharmacy.  Thank you for choosing [facility]!   Low-Sodium Eating Plan Sodium, which is an element that makes up salt, helps you maintain a healthy balance of fluids in your body. Too much sodium can increase your blood pressure and cause fluid and waste to be held in your body. Your health care provider or dietitian may recommend following this plan if you have high blood pressure (hypertension), kidney disease, liver disease, or heart failure. Eating less sodium can help lower your blood pressure, reduce swelling, and protect your heart, liver, and kidneys. What are tips for following this plan? General guidelines  Most people on this plan should limit their sodium intake to 1,500-2,000 mg (milligrams) of sodium each day. Reading food labels  The Nutrition Facts label lists the amount of sodium in one serving of the food. If you eat more than one serving, you must multiply the listed amount of sodium by the number of servings.  Choose foods with less than 140 mg of sodium per serving.  Avoid foods with 300 mg of sodium or more per serving. Shopping  Look for lower-sodium products, often labeled as "low-sodium" or "no salt added."  Always check the sodium content even if foods are labeled as "unsalted" or "no salt added".  Buy fresh foods. ? Avoid canned foods and premade or frozen meals. ? Avoid canned, cured, or processed meats  Buy breads that have less than 80 mg of sodium per slice. Cooking  Eat more home-cooked food and less restaurant, buffet, and fast food.  Avoid adding salt when cooking. Use salt-free seasonings or herbs instead of table salt or sea salt. Check with your health care provider or pharmacist before using salt substitutes.  Cook with plant-based oils, such as canola, sunflower, or olive oil. Meal planning  When eating at a restaurant, ask that your food be prepared with less salt or no salt, if  possible.  Avoid foods that contain MSG (monosodium glutamate). MSG is sometimes added to [place] food, bouillon, and some canned foods. What foods are recommended? The items listed may not be a complete list. Talk with your dietitian about what dietary choices are best for you. Grains Low-sodium cereals, including oats, puffed wheat and rice, and shredded wheat. Low-sodium crackers. Unsalted rice. Unsalted pasta. Low-sodium bread. Whole-grain breads and whole-grain pasta. Vegetables Fresh or frozen vegetables. "No salt added" canned vegetables. "No salt added" tomato sauce and paste. Low-sodium or reduced-sodium tomato and vegetable juice. Fruits Fresh, frozen, or canned fruit. Fruit juice. Meats and other protein foods Fresh or frozen (no salt added) meat, poultry, seafood, and fish. Low-sodium canned tuna and salmon. Unsalted nuts. Dried peas, beans, and lentils without added salt. Unsalted canned beans. Eggs. Unsalted nut butters. Dairy Milk. Soy milk. Cheese that is naturally low in sodium, such as ricotta cheese, fresh mozzarella, or Swiss cheese Low-sodium or reduced-sodium cheese. Cream cheese. Yogurt. Fats and oils Unsalted butter. Unsalted margarine with no trans fat. Vegetable oils such as canola or olive oils. Seasonings and other foods Fresh and dried herbs and spices. Salt-free seasonings. Low-sodium mustard and ketchup. Sodium-free salad dressing. Sodium-free light mayonnaise. Fresh or refrigerated horseradish. Lemon juice. Vinegar. Homemade, reduced-sodium, or low-sodium soups. Unsalted popcorn and pretzels. Low-salt or salt-free chips. What foods are not recommended? The items listed may not be a complete list. Talk with your dietitian about what dietary choices are best for you. Grains Instant hot cereals. Bread stuffing, pancake, and biscuit mixes. Croutons. Seasoned rice or pasta mixes. Noodle  soup cups. Boxed or frozen macaroni and cheese. Regular salted crackers.  Self-rising flour. Vegetables Sauerkraut, pickled vegetables, and relishes. Olives. [place] fries. Onion rings. Regular canned vegetables (not low-sodium or reduced-sodium). Regular canned tomato sauce and paste (not low-sodium or reduced-sodium). Regular tomato and vegetable juice (not low-sodium or reduced-sodium). Frozen vegetables in sauces. Meats and other protein foods Meat or fish that is salted, canned, smoked, spiced, or pickled. Bacon, ham, sausage, hotdogs, corned beef, chipped beef, packaged lunch meats, salt pork, jerky, pickled herring, anchovies, regular canned tuna, sardines, salted nuts. Dairy Processed cheese and cheese spreads. Cheese curds. Blue cheese. Feta cheese. String cheese. Regular cottage cheese. Buttermilk. Canned milk. Fats and oils Salted butter. Regular margarine. Ghee. Bacon fat. Seasonings and other foods Onion salt, garlic salt, seasoned salt, table salt, and sea salt. Canned and packaged gravies. Worcestershire sauce. Tartar sauce. Barbecue sauce. Teriyaki sauce. Soy sauce, including reduced-sodium. Steak sauce. Fish sauce. Oyster sauce. Cocktail sauce. Horseradish that you find on the shelf. Regular ketchup and mustard. Meat flavorings and tenderizers. Bouillon cubes. Hot sauce and Tabasco sauce. Premade or packaged marinades. Premade or packaged taco seasonings. Relishes. Regular salad dressings. Salsa. Potato and tortilla chips. Corn chips and puffs. Salted popcorn and pretzels. Canned or dried soups. Pizza. Frozen entrees and pot pies. Summary  Eating less sodium can help lower your blood pressure, reduce swelling, and protect your heart, liver, and kidneys.  Most people on this plan should limit their sodium intake to 1,500-2,000 mg (milligrams) of sodium each day.  Canned, boxed, and frozen foods are high in sodium. Restaurant foods, fast foods, and pizza are also very high in sodium. You also get sodium by adding salt to food.  Try to cook at home, eat  more fresh fruits and vegetables, and eat less fast food, canned, processed, or prepared foods. This information is not intended to replace advice given to you by your health care provider. Make sure you discuss any questions you have with your health care provider. Document Released: 11/05/2001 Document Revised: 05/09/2016 Document Reviewed: 05/09/2016 Elsevier Interactive Patient Education  2018 [place], Ermalinda Barrios, [place]  10/30/2017 2:40 PM    [facility] [address], [place], [place]  [address] Phone: [phone]; Fax: [phone]

## 2017-10-30 ENCOUNTER — Telehealth: Payer: Self-pay | Admitting: Physician Assistant

## 2017-10-30 ENCOUNTER — Encounter: Payer: Self-pay | Admitting: *Deleted

## 2017-10-30 ENCOUNTER — Ambulatory Visit (INDEPENDENT_AMBULATORY_CARE_PROVIDER_SITE_OTHER): Payer: 59 | Admitting: Physician Assistant

## 2017-10-30 ENCOUNTER — Encounter: Payer: Self-pay | Admitting: Physician Assistant

## 2017-10-30 VITALS — BP 124/72 | HR 66 | Ht 73.0 in | Wt 224.0 lb

## 2017-10-30 DIAGNOSIS — I77810 Thoracic aortic ectasia: Secondary | ICD-10-CM

## 2017-10-30 DIAGNOSIS — R079 Chest pain, unspecified: Secondary | ICD-10-CM

## 2017-10-30 DIAGNOSIS — I10 Essential (primary) hypertension: Secondary | ICD-10-CM

## 2017-10-30 DIAGNOSIS — G4733 Obstructive sleep apnea (adult) (pediatric): Secondary | ICD-10-CM | POA: Diagnosis not present

## 2017-10-30 NOTE — Telephone Encounter (Signed)
FMLA paperwork dropped off needs to be filled out placed in yellow folder.

## 2017-10-30 NOTE — Patient Instructions (Addendum)
Medication Instructions:  Your physician recommends that you continue on your current medications as directed. Please refer to the Current Medication list given to you today.  Take your Norvasc At Night   Labwork: NONE   Testing/Procedures: Your physician has requested that you have en exercise stress myoview. For further information please visit [email or web address]. Please follow instruction sheet, as given.    Follow-Up: Your physician recommends that you schedule a follow-up appointment with Dr. Bronson Ing after Testing.    Any Other Special Instructions Will Be Listed Below (If Applicable).     If you need a refill on your cardiac medications before your next appointment, please call your pharmacy.  Thank you for choosing [facility]!   Low-Sodium Eating Plan Sodium, which is an element that makes up salt, helps you maintain a healthy balance of fluids in your body. Too much sodium can increase your blood pressure and cause fluid and waste to be held in your body. Your health care provider or dietitian may recommend following this plan if you have high blood pressure (hypertension), kidney disease, liver disease, or heart failure. Eating less sodium can help lower your blood pressure, reduce swelling, and protect your heart, liver, and kidneys. What are tips for following this plan? General guidelines  Most people on this plan should limit their sodium intake to 1,500-2,000 mg (milligrams) of sodium each day. Reading food labels  The Nutrition Facts label lists the amount of sodium in one serving of the food. If you eat more than one serving, you must multiply the listed amount of sodium by the number of servings.  Choose foods with less than 140 mg of sodium per serving.  Avoid foods with 300 mg of sodium or more per serving. Shopping  Look for lower-sodium products, often labeled as "low-sodium" or "no salt added."  Always check the sodium content even if  foods are labeled as "unsalted" or "no salt added".  Buy fresh foods. ? Avoid canned foods and premade or frozen meals. ? Avoid canned, cured, or processed meats  Buy breads that have less than 80 mg of sodium per slice. Cooking  Eat more home-cooked food and less restaurant, buffet, and fast food.  Avoid adding salt when cooking. Use salt-free seasonings or herbs instead of table salt or sea salt. Check with your health care provider or pharmacist before using salt substitutes.  Cook with plant-based oils, such as canola, sunflower, or olive oil. Meal planning  When eating at a restaurant, ask that your food be prepared with less salt or no salt, if possible.  Avoid foods that contain MSG (monosodium glutamate). MSG is sometimes added to [place] food, bouillon, and some canned foods. What foods are recommended? The items listed may not be a complete list. Talk with your dietitian about what dietary choices are best for you. Grains Low-sodium cereals, including oats, puffed wheat and rice, and shredded wheat. Low-sodium crackers. Unsalted rice. Unsalted pasta. Low-sodium bread. Whole-grain breads and whole-grain pasta. Vegetables Fresh or frozen vegetables. "No salt added" canned vegetables. "No salt added" tomato sauce and paste. Low-sodium or reduced-sodium tomato and vegetable juice. Fruits Fresh, frozen, or canned fruit. Fruit juice. Meats and other protein foods Fresh or frozen (no salt added) meat, poultry, seafood, and fish. Low-sodium canned tuna and salmon. Unsalted nuts. Dried peas, beans, and lentils without added salt. Unsalted canned beans. Eggs. Unsalted nut butters. Dairy Milk. Soy milk. Cheese that is naturally low in sodium, such as ricotta cheese, fresh mozzarella, or  Swiss cheese Low-sodium or reduced-sodium cheese. Cream cheese. Yogurt. Fats and oils Unsalted butter. Unsalted margarine with no trans fat. Vegetable oils such as canola or olive oils. Seasonings and  other foods Fresh and dried herbs and spices. Salt-free seasonings. Low-sodium mustard and ketchup. Sodium-free salad dressing. Sodium-free light mayonnaise. Fresh or refrigerated horseradish. Lemon juice. Vinegar. Homemade, reduced-sodium, or low-sodium soups. Unsalted popcorn and pretzels. Low-salt or salt-free chips. What foods are not recommended? The items listed may not be a complete list. Talk with your dietitian about what dietary choices are best for you. Grains Instant hot cereals. Bread stuffing, pancake, and biscuit mixes. Croutons. Seasoned rice or pasta mixes. Noodle soup cups. Boxed or frozen macaroni and cheese. Regular salted crackers. Self-rising flour. Vegetables Sauerkraut, pickled vegetables, and relishes. Olives. [place] fries. Onion rings. Regular canned vegetables (not low-sodium or reduced-sodium). Regular canned tomato sauce and paste (not low-sodium or reduced-sodium). Regular tomato and vegetable juice (not low-sodium or reduced-sodium). Frozen vegetables in sauces. Meats and other protein foods Meat or fish that is salted, canned, smoked, spiced, or pickled. Bacon, ham, sausage, hotdogs, corned beef, chipped beef, packaged lunch meats, salt pork, jerky, pickled herring, anchovies, regular canned tuna, sardines, salted nuts. Dairy Processed cheese and cheese spreads. Cheese curds. Blue cheese. Feta cheese. String cheese. Regular cottage cheese. Buttermilk. Canned milk. Fats and oils Salted butter. Regular margarine. Ghee. Bacon fat. Seasonings and other foods Onion salt, garlic salt, seasoned salt, table salt, and sea salt. Canned and packaged gravies. Worcestershire sauce. Tartar sauce. Barbecue sauce. Teriyaki sauce. Soy sauce, including reduced-sodium. Steak sauce. Fish sauce. Oyster sauce. Cocktail sauce. Horseradish that you find on the shelf. Regular ketchup and mustard. Meat flavorings and tenderizers. Bouillon cubes. Hot sauce and Tabasco sauce. Premade or packaged  marinades. Premade or packaged taco seasonings. Relishes. Regular salad dressings. Salsa. Potato and tortilla chips. Corn chips and puffs. Salted popcorn and pretzels. Canned or dried soups. Pizza. Frozen entrees and pot pies. Summary  Eating less sodium can help lower your blood pressure, reduce swelling, and protect your heart, liver, and kidneys.  Most people on this plan should limit their sodium intake to 1,500-2,000 mg (milligrams) of sodium each day.  Canned, boxed, and frozen foods are high in sodium. Restaurant foods, fast foods, and pizza are also very high in sodium. You also get sodium by adding salt to food.  Try to cook at home, eat more fresh fruits and vegetables, and eat less fast food, canned, processed, or prepared foods. This information is not intended to replace advice given to you by your health care provider. Make sure you discuss any questions you have with your health care provider. Document Released: 11/05/2001 Document Revised: 05/09/2016 Document Reviewed: 05/09/2016 Elsevier Interactive Patient Education  [employer].

## 2017-11-01 NOTE — Telephone Encounter (Signed)
Call placed to patient to see why he needed FMLA per patient he has been having blood pressure problems and it is affecting his concentration and is having to miss work due to his doctors appointments to have a sleep study done, a stress test then he has to get fitted for his c-pap machine. Patient states he has already missed 3 weeks from work because his oncology took him out.   FMLA paper work placed in PCP office with with her paper work

## 2017-11-07 ENCOUNTER — Encounter (HOSPITAL_BASED_OUTPATIENT_CLINIC_OR_DEPARTMENT_OTHER)
Admission: RE | Admit: 2017-11-07 | Discharge: 2017-11-07 | Disposition: A | Discharge status: Home / Self Care | Payer: 59 | Source: Ambulatory Visit | Attending: Physician Assistant | Admitting: Physician Assistant

## 2017-11-07 ENCOUNTER — Encounter (HOSPITAL_COMMUNITY): Payer: Self-pay

## 2017-11-07 ENCOUNTER — Encounter (HOSPITAL_COMMUNITY)
Admission: RE | Admit: 2017-11-07 | Discharge: 2017-11-07 | Disposition: A | Discharge status: Home / Self Care | Payer: 59 | Source: Ambulatory Visit | Attending: Physician Assistant | Admitting: Physician Assistant

## 2017-11-07 DIAGNOSIS — I10 Essential (primary) hypertension: Secondary | ICD-10-CM

## 2017-11-07 DIAGNOSIS — R079 Chest pain, unspecified: Secondary | ICD-10-CM | POA: Insufficient documentation

## 2017-11-07 DIAGNOSIS — I77810 Thoracic aortic ectasia: Secondary | ICD-10-CM

## 2017-11-07 DIAGNOSIS — G4733 Obstructive sleep apnea (adult) (pediatric): Secondary | ICD-10-CM | POA: Insufficient documentation

## 2017-11-07 LAB — NM MYOCAR MULTI W/SPECT W/WALL MOTION / EF
CHL CUP NUCLEAR SDS: 0
CHL CUP NUCLEAR SRS: 4
CSEPEW: 7 METS
CSEPPHR: 142 {beats}/min
Exercise duration (min): 6 min
Exercise duration (sec): 10 s
LV dias vol: 143 mL (ref 62–150)
LVSYSVOL: 56 mL
MPHR: 162 {beats}/min
NUC STRESS TID: 0.9
Percent HR: 87 %
RATE: 0.33
RPE: 13
Rest HR: 60 {beats}/min
SSS: 4

## 2017-11-07 MED ORDER — TECHNETIUM TC 99M TETROFOSMIN IV KIT
10.0000 | PACK | Freq: Once | INTRAVENOUS | Status: AC | PRN
  Administered 2017-11-07: 10.5 via INTRAVENOUS

## 2017-11-07 MED ORDER — TECHNETIUM TC 99M TETROFOSMIN IV KIT
30.0000 | PACK | Freq: Once | INTRAVENOUS | Status: AC | PRN
  Administered 2017-11-07: 33 via INTRAVENOUS

## 2017-11-07 MED ORDER — SODIUM CHLORIDE 0.9% FLUSH
INTRAVENOUS | Status: AC
  Filled 2017-11-07: qty 160

## 2017-11-07 MED ORDER — REGADENOSON 0.4 MG/5ML IV SOLN
INTRAVENOUS | Status: AC
  Filled 2017-11-07: qty 5

## 2017-11-07 MED ORDER — SODIUM CHLORIDE 0.9% FLUSH
INTRAVENOUS | Status: AC
  Administered 2017-11-07: 10 mL via INTRAVENOUS
  Filled 2017-11-07: qty 10

## 2017-11-08 NOTE — Telephone Encounter (Signed)
Call placed to patient he is aware that his FMLA papers are available for pickup

## 2017-11-10 ENCOUNTER — Encounter: Payer: Self-pay | Admitting: Physician Assistant

## 2017-11-11 ENCOUNTER — Other Ambulatory Visit: Payer: Self-pay | Admitting: Physician Assistant

## 2017-11-11 ENCOUNTER — Other Ambulatory Visit: Payer: Self-pay | Admitting: Cardiovascular Disease

## 2017-11-11 DIAGNOSIS — K5909 Other constipation: Secondary | ICD-10-CM

## 2017-11-13 ENCOUNTER — Telehealth: Payer: Self-pay

## 2017-11-13 ENCOUNTER — Telehealth: Payer: Self-pay | Admitting: *Deleted

## 2017-11-13 NOTE — Telephone Encounter (Signed)
Call placed to patient he is aware that his note for work can be picked up at the front desk today.     Yes. Please go ahead and write note.    ----- Message -----  From: Lynnae Prude, CMA  Sent: 11/13/2017  8:20 AM  To: Orlena Sheldon, PA-C  Subject: FW: Non-Urgent Medical Question           Is it ok to write the note  ----- Message -----  From: Rennis Golden  Sent: 11/13/2017  8:03 AM  To: Lynnae Prude, CMA  Subject: FW: Non-Urgent Medical Question               ----- Message -----  From: Lynnae Prude, CMA  Sent: 11/10/2017 11:49 AM  To: Orlena Sheldon, PA-C  Subject: FW: Non-Urgent Medical Question               ----- Message -----  From: Dorris Carnes  Sent: 11/10/2017 11:29 AM  To: [facility] Clinical Pool  Subject: Non-Urgent Medical Question             ----- Message from [place], Generic sent at 11/10/2017 11:29 AM EDT -----    Graceann Congress    My employer HR Dept is requesting a letter for me to return to work on Monday, June 17th.     Thank you    Lawanda Cousins

## 2017-11-13 NOTE — Telephone Encounter (Signed)
Patient informed. 

## 2017-11-13 NOTE — Telephone Encounter (Signed)
-----   Message from Imogene Burn, PA-C sent at 11/08/2017  8:17 AM EDT ----- Hypertensive response to exercise, small defect felt secondary to soft tissue attenuation. No ischemia and heart function normal. Low risk study. Needs to keep f/u with Dr. Raliegh Ip for BP management

## 2017-11-13 NOTE — Telephone Encounter (Signed)
Tiffany, please type letter. Thanks

## 2017-11-17 ENCOUNTER — Telehealth: Payer: Self-pay

## 2017-11-17 DIAGNOSIS — K5909 Other constipation: Secondary | ICD-10-CM

## 2017-11-17 NOTE — Telephone Encounter (Signed)
Prior authorization started on linzess 34mcg   [employer] is reviewing your PA request. Typically an electronic response will be received within 72 hours. To check for an update later, open this request from your dashboard. You may close this dialog and return to your dashboard to perform other tasks.(Key: [id number])

## 2017-11-21 MED ORDER — LINACLOTIDE 72 MCG PO CAPS: ORAL_CAPSULE | ORAL | 0 refills | Status: DC

## 2017-11-21 NOTE — Telephone Encounter (Signed)
Prior authorization approved for linzess 72 mcg from 11/18/2018 or until coverage for medcation is no longer available under the health plan or mediication becomes subject to a pharamacy benefit coverage requirement, such as supply limits or notiification whichever occurs first. Pharmacy notified

## 2017-11-27 ENCOUNTER — Ambulatory Visit (INDEPENDENT_AMBULATORY_CARE_PROVIDER_SITE_OTHER): Payer: 59 | Admitting: Physician Assistant

## 2017-11-27 ENCOUNTER — Other Ambulatory Visit: Payer: Self-pay

## 2017-11-27 ENCOUNTER — Encounter: Payer: Self-pay | Admitting: Physician Assistant

## 2017-11-27 VITALS — BP 110/62 | HR 57 | Temp 97.9°F | Resp 12 | Ht 73.0 in | Wt 221.0 lb

## 2017-11-27 DIAGNOSIS — F411 Generalized anxiety disorder: Secondary | ICD-10-CM | POA: Diagnosis not present

## 2017-11-27 DIAGNOSIS — G4733 Obstructive sleep apnea (adult) (pediatric): Secondary | ICD-10-CM | POA: Diagnosis not present

## 2017-11-27 DIAGNOSIS — I10 Essential (primary) hypertension: Secondary | ICD-10-CM

## 2017-11-27 MED ORDER — SERTRALINE HCL 50 MG PO TABS: 50.0000 mg | ORAL_TABLET | Freq: Every day | ORAL | 1 refills | Status: DC

## 2017-11-27 NOTE — Progress Notes (Signed)
Patient ID: George Pratt MRN: [id number], DOB: [birth year], 57 y.o. Date of Encounter: @DATE @  Chief Complaint:  Chief Complaint  Patient presents with  . 6 week follow up with zoloft    HPI: 57 y.o. year old male  presents with above.    10/16/2017: Today I have reviewed his recent ER note dated 10/09/2017. That time he presented with complaints of chest pain radiating to left arm.  Pain woke him at about 4 AM.  Described it as being sharp.  Was worse when lying down, better when sitting up or standing up.  Pain was initially 10/10 but had decreased to 8/10.  Ported that he had been diaphoretic when he woke up.  Had some mild dyspnea but no nausea.  Had done nothing to treat it.  Not a smoker.  Noted that he does have heart history of hypertension but no history of diabetes or hyperlipidemia.  No family history of premature CAD.  At that he does have history of hypertension as well as dilated aortic root.  Troponin was negative. EKG showed no significant abnormality. CT angiogram was performed.  No evidence of aortic dissection.  Slight dilation of aortic root at 4.0 cm, stable.  No acute findings in chest abdomen or pelvis.  Today I also reviewed his last note by cardiology on 09/29/2017.  Today I also reviewed his note by Dr. Brett Fairy at [facility] neurology regarding sleep apnea and that note was dated 09/28/2017.  Today patient reports that he has a follow-up appointment scheduled to cardiology for June 3.  Also reports that he does have follow-up scheduled to have sleep study.  Today his wife accompanies him for visit. Today patient states that he feels like he has been having problems with concentration and focus and states that he has had increased irritability.  Wife also adds comments regarding increased irritability.  Husband as and that he always feels stressed and anxious and irritable and that makes him feel like he is in a fog.  Says that he really has not felt like he wants to  do anything recently.  Discussed that his blood pressure fluctuates.  Says that it goes down to 125 after he takes his medicines but then at the end of the day it goes up towards 150.  That he does not know whether these blood pressure fluctuations are contributing to symptoms.  We also reviewed that some of the blood pressure medications especially clonidine could be contributing to his fatigue and associated symptoms. However we also discussed that he is on maximum doses of multiple classes of blood pressure medicines and that there is not going to be a lot of options regarding adjusting his BP meds and that we will leave that to cardiology.  Also discussed that if he does have sleep apnea, is put on treatment for this that this would also improve the symptoms that are frequently sleep apnea can cause problems with decreased concentration and focus and increased irritability.  Discussed waiting for him to have follow-up with cardiology and seeing if they will adjust some medications and also discussed waiting for him to have follow-up with Dr. Brett Fairy to see if he ends up getting treatment for sleep apnea and whether all of the symptoms improve with these changes.  I did discuss possibility of adding a medication such as Zoloft to see if this helps with his anxiety, feeling of stress, irritability.  Ultimately after all of this discussion he does agree that he  feels that he would benefit from adding Zoloft in addition to following up with cardiology and sleep study.   A/P AT THAT OV:  Generalized anxiety disorder Discussed proper expectations of medication.  If feels that medication is causing adverse effect then he is to stop the medication and call us.  Otherwise even if he is not seeing much effect from medication he is to continue taking it until follow-up visit in 6 weeks.  Discussed that this does take time to become effective. - sertraline (ZOLOFT) 50 MG tablet; Take 1 tablet (50 mg  total) by mouth daily.  Dispense: 30 tablet; Refill: 1  HTN (hypertension), malignant He is on multiple blood pressure medications.  He has follow-up appointment with cardiology scheduled for June 3 so we will wait for them to make any further adjustments in medication.  Aortic root dilatation [facility]) -- Management Per Cardiology.  Was stable on recent CT angiogram.  OSA (obstructive sleep apnea) This is being evaluated by Dr. Brett Fairy and he is to follow-up with sleep study with her.  He will schedule follow-up visit with me in 6 weeks and will follow up all of the above at that visit.  Follow-up sooner if needed.    11/27/2017: Today he reports that the Zoloft does seem to be helping. He states that he is feeling much better.  He has gotten on CPAP.  His blood pressure is good.  He is feeling less agitated since being on the Zoloft.  The Zoloft is causing no side effects.  Does want to continue the Zoloft.  Says that in addition to getting his blood pressure controlled and getting his sleep apnea treated that does think that the Zoloft is also helping with making his agitation decreased and decreasing anxiety.    Past Medical History:  Diagnosis Date  . Hypertension   . Sleep apnea      Home Meds: Outpatient Medications Prior to Visit  Medication Sig Dispense Refill  . amLODipine (NORVASC) 10 MG tablet TAKE 1 TABLET BY MOUTH  DAILY 90 tablet 1  . aspirin EC 81 MG tablet Take 1 tablet (81 mg total) by mouth daily.    . baclofen (LIORESAL) 10 MG tablet Take 1 tablet (10 mg total) by mouth 3 (three) times daily. (Patient taking differently: Take 10 mg by mouth daily as needed for muscle spasms. ) 90 tablet 1  . cloNIDine (CATAPRES) 0.1 MG tablet Take 1 tablet (0.1 mg total) by mouth 3 (three) times daily. (Patient taking differently: Take 0.1 mg by mouth 2 (two) times daily. ) 90 tablet 11  . labetalol (NORMODYNE) 100 MG tablet Take 1 tablet by mouth two  times daily 180 tablet 3  .  linaclotide (LINZESS) 72 MCG capsule TAKE 1 CAPSULE BY MOUTH  DAILY BEFORE BREAKFAST 90 capsule 0  . lisinopril (PRINIVIL,ZESTRIL) 40 MG tablet TAKE 1 TABLET BY MOUTH  DAILY 90 tablet 1  . Multiple Vitamins-Minerals (ONE-A-DAY MENS 50+ ADVANTAGE PO) Take 1 tablet by mouth daily.    . traMADol (ULTRAM) 50 MG tablet Take 1 tablet (50 mg total) by mouth every 6 (six) hours as needed. 15 tablet 0  . hydrALAZINE (APRESOLINE) 50 MG tablet Take 1.5 tablets (75 mg total) by mouth 3 (three) times daily. 405 tablet 3  . sertraline (ZOLOFT) 50 MG tablet Take 1 tablet (50 mg total) by mouth daily. 30 tablet 1  . hydrALAZINE (APRESOLINE) 50 MG tablet Take 1.5 tablets (75 mg total) by mouth 3 (three) times  daily. 405 tablet 3   No facility-administered medications prior to visit.     Allergies:  Allergies  Allergen Reactions  . Bee Venom Swelling    Social History   Socioeconomic History  . Marital status: Married    Spouse name: Not on file  . Number of children: Not on file  . Years of education: Not on file  . Highest education level: Not on file  Occupational History  . Not on file  Social Needs  . Financial resource strain: Not on file  . Food insecurity:    Worry: Not on file    Inability: Not on file  . Transportation needs:    Medical: Not on file    Non-medical: Not on file  Tobacco Use  . Smoking status: Never Smoker  . Smokeless tobacco: Never Used  Substance and Sexual Activity  . Alcohol use: No    Alcohol/week: 0.0 oz  . Drug use: No  . Sexual activity: Yes  Lifestyle  . Physical activity:    Days per week: Not on file    Minutes per session: Not on file  . Stress: Not on file  Relationships  . Social connections:    Talks on phone: Not on file    Gets together: Not on file    Attends religious service: Not on file    Active member of club or organization: Not on file    Attends meetings of clubs or organizations: Not on file    Relationship status: Not on file    . Intimate partner violence:    Fear of current or ex partner: Not on file    Emotionally abused: Not on file    Physically abused: Not on file    Forced sexual activity: Not on file  Other Topics Concern  . Not on file  Social History Narrative   Married.    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    --Is available for students to call him, email him, etc   Says it is for a group of multiple colleges--in [employer], etc.    Family History  Problem Relation Age of Onset  . Hypertension Mother   . Heart failure Mother   . Heart disease Mother   . Hypertension Father   . Diabetes Father   . Vision loss Father   . Hypertension Brother   . Hypertension Brother   . Hypertension Brother      Review of Systems:  See HPI for pertinent ROS. All other ROS negative.    Physical Exam: Blood pressure 110/62, pulse (!) 57, temperature 97.9 F (36.6 C), temperature source Oral, resp. rate 12, height 6\' 1"  (1.854 m), weight 100.2 kg (221 lb), SpO2 97 %., Body mass index is 29.16 kg/m. General: WNWD WM. Appears in no acute distress. Neck: Supple. No thyromegaly. No lymphadenopathy. Lungs: Clear bilaterally to auscultation without wheezes, rales, or rhonchi. Breathing is unlabored. Heart: RRR with S1 S2. No murmurs, rubs, or gallops. Abdomen: Soft, non-tender, non-distended with normoactive bowel sounds. No hepatomegaly. No rebound/guarding. No obvious abdominal masses. Musculoskeletal:  Strength and tone normal for age. Extremities/Skin: Warm and dry. No edema.  Neuro: Alert and oriented X 3. Moves all extremities spontaneously. Gait is normal. CNII-XII grossly in tact. Psych:  Responds to questions appropriately with a normal affect.     ASSESSMENT AND PLAN:  57 y.o. year old male with   Generalized anxiety disorder 11/27/2017: This is much  improved with use of Zoloft 50 mg.  This is now stable and controlled.  Continue Zoloft 50 mg daily.  HTN  (hypertension), malignant 11/27/2017: Blood Pressure is now well controlled.  Continue current blood pressure medications.  He also reports that he does have follow-up scheduled at cardiology for July 17.  Aortic root dilatation ([place]) -11/27/2017: - Management Per Cardiology.  Was stable on recent CT angiogram.   OSA (obstructive sleep apnea) 11/27/2017: Reports that he did have his sleep study and has been started on CPAP.  Managed by  Dr. Brett Fairy.   Has a CPE scheduled with me in October.  We will plan for follow-up at that time or sooner if needed.   [address] [place], [place], [facility] 11/27/2017 4:38 PM

## 2017-12-13 ENCOUNTER — Encounter: Payer: Self-pay | Admitting: Cardiovascular Disease

## 2017-12-13 ENCOUNTER — Ambulatory Visit (INDEPENDENT_AMBULATORY_CARE_PROVIDER_SITE_OTHER): Payer: 59 | Admitting: Cardiovascular Disease

## 2017-12-13 ENCOUNTER — Other Ambulatory Visit: Payer: Self-pay

## 2017-12-13 VITALS — BP 99/61 | HR 86 | Ht 73.0 in | Wt 221.0 lb

## 2017-12-13 DIAGNOSIS — G4733 Obstructive sleep apnea (adult) (pediatric): Secondary | ICD-10-CM | POA: Diagnosis not present

## 2017-12-13 DIAGNOSIS — Z9989 Dependence on other enabling machines and devices: Secondary | ICD-10-CM

## 2017-12-13 DIAGNOSIS — E669 Obesity, unspecified: Secondary | ICD-10-CM

## 2017-12-13 DIAGNOSIS — I77810 Thoracic aortic ectasia: Secondary | ICD-10-CM

## 2017-12-13 DIAGNOSIS — I10 Essential (primary) hypertension: Secondary | ICD-10-CM | POA: Diagnosis not present

## 2017-12-13 NOTE — Progress Notes (Signed)
SUBJECTIVE: The patient presents for routine follow-up.  He underwent a nuclear stress test on 11/07/2017.  This demonstrated a hypertensive response to exercise.  Soft tissue attenuation was noted with no significant ischemia.  Regional wall motion was normal.  It was a low risk study, EF 61%.  He underwent a sleep study and was found to have obstructive sleep apnea and has been started on CPAP.  He has accelerated hypertension.  He is feeling much better since starting CPAP.  He denies chest pain.  He gets better quality sleep.  He would like to come off of clonidine.  He takes it twice a day.  Immediately after taking it systolic blood pressures dropped to the 90-99 range.  He then feels groggy.  He experimented and stopped taking it for a few days and the highest his systolic blood pressure got was in the 135-140 range.  He is planning to pursue a doctoral degree in leadership organizational management beginning in October 2019.  His wife is also pursuing a doctor in behavioral health.   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  His father who is now 82 years old is a retired Lexicographer who practiced for 28 years in [place].  His father lives with his family now.   Review of Systems: As per "subjective", otherwise negative.  Allergies  Allergen Reactions  . Bee Venom Swelling    Current Outpatient Medications  Medication Sig Dispense Refill  . amLODipine (NORVASC) 10 MG tablet TAKE 1 TABLET BY MOUTH  DAILY 90 tablet 1  . aspirin EC 81 MG tablet Take 1 tablet (81 mg total) by mouth daily.    . baclofen (LIORESAL) 10 MG tablet Take 1 tablet (10 mg total) by mouth 3 (three) times daily. (Patient taking differently: Take 10 mg by mouth daily as needed for muscle spasms. ) 90 tablet 1  . cloNIDine  (CATAPRES) 0.1 MG tablet Take 1 tablet (0.1 mg total) by mouth 3 (three) times daily. (Patient taking differently: Take 0.1 mg by mouth 2 (two) times daily. ) 90 tablet 11  . hydrALAZINE (APRESOLINE) 50 MG tablet Take 75 mg by mouth 3 (three) times daily.    Marland Kitchen labetalol (NORMODYNE) 100 MG tablet Take 1 tablet by mouth two  times daily 180 tablet 3  . linaclotide (LINZESS) 72 MCG capsule TAKE 1 CAPSULE BY MOUTH  DAILY BEFORE BREAKFAST 90 capsule 0  . lisinopril (PRINIVIL,ZESTRIL) 40 MG tablet TAKE 1 TABLET BY MOUTH  DAILY 90 tablet 1  . Multiple Vitamins-Minerals (ONE-A-DAY MENS 50+ ADVANTAGE PO) Take 1 tablet by mouth daily.    . sertraline (ZOLOFT) 50 MG tablet Take 1 tablet (50 mg total) by mouth daily. 90 tablet 1  . traMADol (ULTRAM) 50 MG tablet Take 1 tablet (50 mg total) by mouth every 6 (six) hours as needed. 15 tablet 0   No current facility-administered medications for this visit.     Past Medical History:  Diagnosis Date  . Hypertension   . Sleep apnea     Past Surgical History:  Procedure Laterality Date  . APPENDECTOMY  2006  . COLONOSCOPY N/A 10/01/2015   Procedure: COLONOSCOPY;  Surgeon: Rogene Houston, MD;  [facility]: [facility];  Service: Endoscopy;  Laterality: N/A;  1030  . KNEE SURGERY    .  NECK SURGERY  2000    Social History   Socioeconomic History  . Marital status: Married    Spouse name: Not on file  . Number of children: Not on file  . Years of education: Not on file  . Highest education level: Not on file  Occupational History  . Not on file  Social Needs  . Financial resource strain: Not on file  . Food insecurity:    Worry: Not on file    Inability: Not on file  . Transportation needs:    Medical: Not on file    Non-medical: Not on file  Tobacco Use  . Smoking status: Never Smoker  . Smokeless tobacco: Never Used  Substance and Sexual Activity  . Alcohol use: No    Alcohol/week: 0.0 oz  . Drug use: No  . Sexual activity: Yes    Lifestyle  . Physical activity:    Days per week: Not on file    Minutes per session: Not on file  . Stress: Not on file  Relationships  . Social connections:    Talks on phone: Not on file    Gets together: Not on file    Attends religious service: Not on file    Active member of club or organization: Not on file    Attends meetings of clubs or organizations: Not on file    Relationship status: Not on file  . Intimate partner violence:    Fear of current or ex partner: Not on file    Emotionally abused: Not on file    Physically abused: Not on file    Forced sexual activity: Not on file  Other Topics Concern  . Not on file  Social History Narrative   Married.    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     Vitals:   12/13/17 0850  BP: 99/61  Pulse: 86  SpO2: 96%  Weight: 221 lb (100.2 kg)  Height: 6\' 1"  (1.854 m)    Wt Readings from Last 3 Encounters:  12/13/17 221 lb (100.2 kg)  11/27/17 221 lb (100.2 kg)  10/30/17 224 lb (101.6 kg)     PHYSICAL EXAM General: NAD HEENT: Normal. Neck: No JVD, no thyromegaly. Lungs: Clear to auscultation bilaterally with normal respiratory effort. CV: Regular rate and rhythm, normal S1/S2, no S3/S4, no murmur. No pretibial or periankle edema.    Abdomen: Soft, nontender, no distention.  Neurologic: Alert and oriented.  Psych: Normal affect. Skin: Normal. Musculoskeletal: No gross deformities.    ECG: Reviewed above under Subjective   Labs: Lab Results  Component Value Date/Time   K 3.7 10/09/2017 05:20 AM   BUN 17 10/09/2017 05:20 AM   CREATININE 1.22 10/09/2017 05:20 AM   CREATININE 1.18 03/08/2017 09:39 AM   ALT 11 03/08/2017 09:39 AM   TSH 2.30 03/08/2017 09:39 AM   HGB 14.0 10/09/2017 05:30 AM     Lipids: Lab Results  Component Value Date/Time    LDLCALC 94 03/08/2017 09:39 AM   CHOL 153 03/08/2017 09:39 AM   TRIG 63 03/08/2017 09:39 AM   HDL 44 03/08/2017 09:39 AM       ASSESSMENT AND PLAN:  1.  Accelerated hypertension: Blood pressure is low normal.   He would like to stop taking clonidine which I think is  reasonable.  I will continue his other medications which include amlodipine, hydralazine, labetalol, and lisinopril.  2.  Aortic root dilatation: Most recent chest CT deviously reviewed from August 2017 measuring 4.2 cm in diameter.  He prefers to avoid further testing at this time.  3.  Obesity: He is down to 221 pounds and would like to get down to 195 pounds with diet and exercise.  He hopes to reduce the amount of antihypertensive therapy he is currently taking.  4.  Obstructive sleep apnea: On CPAP.   Disposition: Follow up 6 months   Kate Sable, M.D., F.A.C.C.

## 2017-12-13 NOTE — Patient Instructions (Signed)
Your physician wants you to follow-up in: [place] will receive a reminder letter in the mail two months in advance. If you don't receive a letter, please call our office to schedule the follow-up appointment.  Your physician has recommended you make the following change in your medication:   STOP CLONIDINE   Thank you for choosing [facility]!!

## 2018-01-17 ENCOUNTER — Encounter: Payer: Self-pay | Admitting: Neurology

## 2018-01-17 ENCOUNTER — Ambulatory Visit (INDEPENDENT_AMBULATORY_CARE_PROVIDER_SITE_OTHER): Payer: 59 | Admitting: Neurology

## 2018-01-17 VITALS — BP 130/78 | HR 68 | Ht 73.0 in | Wt 221.0 lb

## 2018-01-17 DIAGNOSIS — G4733 Obstructive sleep apnea (adult) (pediatric): Secondary | ICD-10-CM | POA: Diagnosis not present

## 2018-01-17 DIAGNOSIS — Z9989 Dependence on other enabling machines and devices: Secondary | ICD-10-CM | POA: Diagnosis not present

## 2018-01-17 NOTE — Patient Instructions (Signed)

## 2018-01-17 NOTE — Progress Notes (Signed)
SLEEP MEDICINE CLINIC   Provider:  Larey Seat, M D  Primary Care Physician:  Rennis Golden   Referring Provider: Orlena Sheldon, PA-C    Chief Complaint  Patient presents with  . Follow-up    pt with daughter, rm 38. pt states that he is used to the machine and able to keep track on the machine. DME [employer]    HPI:  George Pratt is a 57 y.o. male , and returns today for revisit on 17 January 2018.   He underwent a home sleep study on 18 Oct 2017 which revealed a mild apnea with an AHI of 9.1, moderate to loud snoring with an RDI of 11.3/h, there was no significant oxygen desaturation and his heart rate was in regular rhythm and neither slow or too fast.  I concluded that the patient is used to CPAP and not claustrophobic could use an auto titration CPAP to treat this degree of apnea with a mask of his choice, alternative treatments would include a dental device and he would likely reduce his apnea further by weight reduction.  The patient continued CPAP therapy with an AutoSet between 5 and 12 cmH2O pressure and 3 cm expiratory pressure relief.  The average use of time is 8 hours and 33 minutes was 100% compliance, there is also an excellent resolution of apnea of his residual AHI exclamation apnea hypopnea index exclamation was only 0.6/h under treatment.  His pressure window as well was in the 95th percentile of 8.7 cm water pressure and he doesn't have any air leaks.  He rolls over on his back - he was advised of the tennis ball treatment, and he reports better sleep on CPAP, is more refreshed and restored.    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 Doren Custard for evaluation of sleep apnea.  The patient had recently overcome pneumonia. [name] had established care with George Billet, PA in August 2016 as a primary care provider. His father Dr. Renato Battles was a pediatrician in [place].  Is treated him for some time for any medical  problems.  He does have a cardiologist that he sees routinely.  In March of this year he reported that his blood pressures have been all over the place and had been elevated.  He was quite shortly after diagnosed with the with pneumonia.  He also has a dilated aortic arch and knows that high blood pressures can foster that condition make it worse.  He has been treated for obstructive sleep apnea by CPAP he has had his CPAP since 2013 and had no evaluation of sleep apnea since and no follow-up.  He has not been necessarily routinely using CPAP.  But is now worried because sleep apnea may be contributing to his hypertension. He would like a new evaluation. He is currently on Norvasc, hydralazine, labetalol, lisinopril, tramadol as needed, Linzess for constipation.  Family history of hypertension heart failure and heart disease in his mother hypertension diabetes in his father, brother affected by hypertension.  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 Switches TV off and avoid screen light after 9 Pm and goes to bed between 10 and 11 Pm. He has no trouble to initiate sleep, snores only in supine, not when on his side. Sleeps on 4 pillows. He uses an adjustable bed and sleeps reclined.  He doesn't use CPAP currently, sleeps  through until 2 AM and again 4 Am ( nocturia). Sleeps up to 8 hours, once or twice has nocturia.  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  He starts 10, over there 8 AM with sessions.  He rises at 6.30-7 AM , gets the kids ready for school ( children are from 45, from his first marriage - 2 grandchildren live with him  -and a daughter , 6 years old with second wife.  All go to school together.   Sleep medical history and family sleep history:  Pneumonia, HTN, constipation.   Social history: non smoker, never, OTOH, caffeine - no caffeine. No shift work.    Review of Systems: Out of a complete 14 system review, the patient  complains of only the following symptoms, and all other reviewed systems are negative. Dysphonia, hoarseness, tachycardia, HTN, coughing, weight gain.   Epworth score  4/ 24  , Fatigue severity score 19/ 63   , depression score 2/ 15    Social History   Socioeconomic History  . Marital status: Married    Spouse name: Not on file  . Number of children: Not on file  . Years of education: Not on file  . Highest education level: Not on file  Occupational History  . Not on file  Social Needs  . Financial resource strain: Not on file  . Food insecurity:    Worry: Not on file    Inability: Not on file  . Transportation needs:    Medical: Not on file    Non-medical: Not on file  Tobacco Use  . Smoking status: Never Smoker  . Smokeless tobacco: Never Used  Substance and Sexual Activity  . Alcohol use: No    Alcohol/week: 0.0 standard drinks  . Drug use: No  . Sexual activity: Yes  Lifestyle  . Physical activity:    Days per week: Not on file    Minutes per session: Not on file  . Stress: Not on file  Relationships  . Social connections:    Talks on phone: Not on file    Gets together: Not on file    Attends religious service: Not on file    Active member of club or organization: Not on file    Attends meetings of clubs or organizations: Not on file    Relationship status: Not on file  . Intimate partner violence:    Fear of current or ex partner: Not on file    Emotionally abused: Not on file    Physically abused: Not on file    Forced sexual activity: Not on file  Other Topics Concern  . 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    Family History  Problem Relation Age of Onset  . Hypertension Mother   . Heart failure Mother   . Heart disease Mother   .  Hypertension Father   . Diabetes Father   . Vision loss Father   . Hypertension Brother   . Hypertension Brother   . Hypertension Brother     Past Medical History:  Diagnosis Date  . Hypertension   . Sleep apnea     Past Surgical History:  Procedure Laterality Date  . APPENDECTOMY  2006  . COLONOSCOPY N/A 10/01/2015   Procedure: COLONOSCOPY;  Surgeon: Rogene Houston, MD;  Location[facility];  Service: Endoscopy;  Laterality: N/A;  1030  . KNEE SURGERY    . NECK SURGERY  2000    Current Outpatient Medications  Medication Sig Dispense Refill  . amLODipine (NORVASC) 10 MG tablet TAKE 1 TABLET BY MOUTH  DAILY 90 tablet 1  . aspirin EC 81 MG tablet Take 1 tablet (81 mg total) by mouth daily.    . baclofen (LIORESAL) 10 MG tablet Take 1 tablet (10 mg total) by mouth 3 (three) times daily. (Patient taking differently: Take 10 mg by mouth daily as needed for muscle spasms. ) 90 tablet 1  . hydrALAZINE (APRESOLINE) 50 MG tablet Take 75 mg by mouth 3 (three) times daily.    Marland Kitchen labetalol (NORMODYNE) 100 MG tablet Take 1 tablet by mouth two  times daily 180 tablet 3  . linaclotide (LINZESS) 72 MCG capsule TAKE 1 CAPSULE BY MOUTH  DAILY BEFORE BREAKFAST 90 capsule 0  . lisinopril (PRINIVIL,ZESTRIL) 40 MG tablet TAKE 1 TABLET BY MOUTH  DAILY 90 tablet 1  . Multiple Vitamins-Minerals (ONE-A-DAY MENS 50+ ADVANTAGE PO) Take 1 tablet by mouth daily.    . sertraline (ZOLOFT) 50 MG tablet Take 1 tablet (50 mg total) by mouth daily. 90 tablet 1  . traMADol (ULTRAM) 50 MG tablet Take 1 tablet (50 mg total) by mouth every 6 (six) hours as needed. 15 tablet 0   No current facility-administered medications for this visit.    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 Doren Custard for evaluation of sleep apnea. The patient had recently overcome pneumonia.  Mr. Reczek father was a pediatrician in [place] and treated him for some time for any medical problems.  He does have a primary care provider ( referring)  and a cardiologist whom he sees routinely.  In March of this year he reported that his blood pressures have been "all over the place" and had been unusually elevated and was diagnosed with pneumonia.  He also has a dilated aortic arch and knows that high blood pressures can make it worse.  He has been treated for obstructive sleep apnea by CPAP since 2013 and had no evaluation of sleep apnea since and no follow-up.   He has not been routinely using CPAP, but is now worried that sleep apnea may be contributing to his hypertension.    Epworth Sleepiness Score endorsed at 04/ 24 points,  Fatigue severity score at 19/ 63 points.   The BMI is 31.4  STUDY RESULTS:  Total Recording Time:  7 hours, 56  minutes.  Total Apnea/Hypopnea Index (AHI): 9.1 /h, RDI: 11.3 /h. Average Oxygen Saturation: 94 %, Lowest Oxygen Desaturation: 87%. Total Time Oxygen Saturation below 89% %: 1.0 minute.  Average Heart Rate:  60 bpm (between 52 and 89 bpm).  IMPRESSION: Mild sleep apnea that can be treated with CPAP but could also respond to weight loss or a dental device.  Snoring is moderately loud.  No associated hypoxemia and no arrhythmia.   RECOMMENDATION: In a patient used to CPAP and not claustrophobic I would like to use an auto-titration device between 5-12 cm water, 3 cm EPR and a mask of his choice.   I certify that I have reviewed the raw data recording prior to the issuance of this report in accordance with the standards of the American Academy of Sleep Medicine (AASM).   Larey Seat, M.D.     10-20-2017        Allergies as of 01/17/2018 - Review  Complete 01/17/2018  Allergen Reaction Noted  . Bee venom Swelling 02/07/2014    Vitals: BP 130/78   Pulse 68   Ht 6\' 1"  (1.854 m)   Wt 221 lb (100.2 kg)   BMI 29.16 kg/m  Last Weight:  Wt Readings from Last 1 Encounters:  01/17/18 221 lb (100.2 kg)   TKW:IOXB mass index is 29.16 kg/m.     Last Height:   Ht Readings from Last 1 Encounters:  01/17/18 6\' 1"  (1.854 m)    Physical exam:  General: The patient is awake, alert and appears not in acute distress. The patient is well groomed. Head: Normocephalic, atraumatic. Neck is supple. Mallampati 3 ,  Used to wear jaw surgery, palate surgery. Overbite - neck circumference: 18 " . Nasal airflow congested , TMJ is not  evident . Retrognathia is not seen.  Cardiovascular:  Regular rate and rhythm , without  murmurs or carotid bruit, and without distended neck veins. Respiratory: Lungs are clear to auscultation. Skin:  Without evidence of edema, or rash Trunk: BMI is 30.6. The patient's posture is erect.   Neurologic exam : The patient is awake and alert, oriented to place and time.   Memory subjective described as intact.   Attention span & concentration ability appears normal.  Speech is fluent,  without dysarthria, dysphonia or aphasia.  Mood and affect are appropriate. Dysphonia is evident.   Cranial nerves: Pupils are equal and briskly reactive to light.  Funduscopic exam deferred.  Extraocular movements in vertical and horizontal planes  without nystagmus. Visual fields by finger perimetry are intact. Hearing to finger rub intact. Facial sensation intact to fine touch.Facial motor strength is symmetric and tongue and uvula move midline. Shoulder shrug was symmetrical.   Motor exam:  Normal tone, muscle bulk and symmetric strength in all extremities. Gait and station: Patient walks without assistive device and is able unassisted to climb up to the exam table. Strength within normal limits. Stance is stable and normal.  Turns  with 3 Steps. Deep tendon reflexes: in the  upper and lower extremities are symmetric and intact.    Assessment:  After physical and neurologic examination, review of laboratory studies,  Personal review of imaging studies, reports of other /same  Imaging studies, results of polysomnography and / or neurophysiology testing and pre-existing records as far as provided in visit., my assessment is   1)  OSA was less than 10 but above 5/h on HST - no hypoxemia or tachybrady cardia. This mild sleep apnea is  treated with  auto-titration CPAP, 95% pressure was 8.7 cm water. Uses a nasal mask, Eson -. He still rolls over on his back - he was advised of the tennis ball treatment.  He reports better sleep on CPAP, is more refreshed and restored.   He has lost 7 pounds and plans on more weight loss. He told that his father , a pediatrician, has likely OSA.    The patient was advised of the nature of the diagnosed disorder, the treatment options and the  risks for general health and wellness arising from not treating the condition.   I spent more than 25 minutes of face to face time with the patient.  Greater than 50% of time was spent in counseling and coordination of care. We have discussed the diagnosis and differential and I answered the patient's questions.     Larey Seat, MD 10/14/3356, 25:18 AM  Certified in Neurology by ABPN Certified in [place] by [facility] [address] [place], [place] [address]

## 2018-01-25 ENCOUNTER — Other Ambulatory Visit: Payer: Self-pay | Admitting: Physician Assistant

## 2018-01-25 ENCOUNTER — Other Ambulatory Visit: Payer: Self-pay | Admitting: Cardiovascular Disease

## 2018-01-25 DIAGNOSIS — K5909 Other constipation: Secondary | ICD-10-CM

## 2018-03-12 ENCOUNTER — Ambulatory Visit (INDEPENDENT_AMBULATORY_CARE_PROVIDER_SITE_OTHER): Payer: Medicare Other | Admitting: Physician Assistant

## 2018-03-12 ENCOUNTER — Encounter: Payer: Self-pay | Admitting: Physician Assistant

## 2018-03-12 VITALS — BP 116/60 | HR 64 | Temp 97.8°F | Resp 16 | Ht 73.0 in | Wt 223.0 lb

## 2018-03-12 DIAGNOSIS — F411 Generalized anxiety disorder: Secondary | ICD-10-CM | POA: Diagnosis not present

## 2018-03-12 DIAGNOSIS — Z125 Encounter for screening for malignant neoplasm of prostate: Secondary | ICD-10-CM

## 2018-03-12 DIAGNOSIS — Z1212 Encounter for screening for malignant neoplasm of rectum: Secondary | ICD-10-CM | POA: Diagnosis not present

## 2018-03-12 DIAGNOSIS — R739 Hyperglycemia, unspecified: Secondary | ICD-10-CM | POA: Diagnosis not present

## 2018-03-12 DIAGNOSIS — Z1211 Encounter for screening for malignant neoplasm of colon: Secondary | ICD-10-CM | POA: Diagnosis not present

## 2018-03-12 DIAGNOSIS — I10 Essential (primary) hypertension: Secondary | ICD-10-CM

## 2018-03-12 DIAGNOSIS — Z9989 Dependence on other enabling machines and devices: Secondary | ICD-10-CM | POA: Diagnosis not present

## 2018-03-12 DIAGNOSIS — B9689 Other specified bacterial agents as the cause of diseases classified elsewhere: Secondary | ICD-10-CM

## 2018-03-12 DIAGNOSIS — G4733 Obstructive sleep apnea (adult) (pediatric): Secondary | ICD-10-CM | POA: Diagnosis not present

## 2018-03-12 DIAGNOSIS — J988 Other specified respiratory disorders: Secondary | ICD-10-CM

## 2018-03-12 DIAGNOSIS — Z0000 Encounter for general adult medical examination without abnormal findings: Secondary | ICD-10-CM

## 2018-03-12 MED ORDER — AZITHROMYCIN 250 MG PO TABS: ORAL_TABLET | ORAL | 0 refills | Status: AC

## 2018-03-12 NOTE — Progress Notes (Signed)
Subjective:   George Pratt is a 57 y.o. male who presents for Medicare Annual/Subsequent preventive examination.  Cardiac Risk Factors include: hypertension     Objective:    Vitals: BP 116/60   Pulse 64   Temp 97.8 F (36.6 C) (Oral)   Resp 16   Ht 6\' 1"  (1.854 m)   Wt 101.2 kg   SpO2 96%   BMI 29.42 kg/m   Body mass index is 29.42 kg/m.  Advanced Directives 03/12/2018 10/09/2017 01/20/2016 10/01/2015 07/23/2015 02/07/2014  Does Patient Have a Medical Advance Directive? No No No No No No  Would patient like information on creating a medical advance directive? No - Patient declined No - Patient declined No - patient declined information No - patient declined information Yes - Educational materials given -   Reports that he has done these things for his father but has not done for himself yet.  He is agreeable for me to give him the information today he has taken home information to review regarding the following topics: --Advanced directives --Living will --Healthcare power of attorney  Tobacco Social History   Tobacco Use  Smoking Status Never Smoker  Smokeless Tobacco Never Used       Past Medical History:  Diagnosis Date  . Hypertension   . Sleep apnea    Past Surgical History:  Procedure Laterality Date  . APPENDECTOMY  2006  . COLONOSCOPY N/A 10/01/2015   Procedure: COLONOSCOPY;  Surgeon: Rogene Houston, MD;  [facility];  Service: Endoscopy;  Laterality: N/A;  1030  . KNEE SURGERY    . NECK SURGERY  2000   Family History  Problem Relation Age of Onset  . Hypertension Mother   . Heart failure Mother   . Heart disease Mother   . Hypertension Father   . Diabetes Father   . Vision loss Father   . Hypertension Brother   . Hypertension Brother   . Hypertension Brother    Social History   Socioeconomic History  . Marital status: Married    Spouse name: Not on file  . Number of children: Not on file  . Years of education: Not on file  .  Highest education level: Not on file  Occupational History  . Not on file  Social Needs  . Financial resource strain: Not on file  . Food insecurity:    Worry: Not on file    Inability: Not on file  . Transportation needs:    Medical: Not on file    Non-medical: Not on file  Tobacco Use  . Smoking status: Never Smoker  . Smokeless tobacco: Never Used  Substance and Sexual Activity  . Alcohol use: No    Alcohol/week: 0.0 standard drinks  . Drug use: No  . Sexual activity: Yes  Lifestyle  . Physical activity:    Days per week: Not on file    Minutes per session: Not on file  . Stress: Not on file  Relationships  . Social connections:    Talks on phone: Not on file    Gets together: Not on file    Attends religious service: Not on file    Active member of club or organization: Not on file    Attends meetings of clubs or organizations: Not on file    Relationship status: Not on file  Other Topics Concern  . Not on file  Social History Narrative   Married.    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    --Is available for students to call him, email him, etc   Says it is for a group of multiple colleges--in [facility], etc.    Outpatient Encounter Medications as of 03/12/2018  Medication Sig  . amLODipine (NORVASC) 10 MG tablet TAKE 1 TABLET BY MOUTH  DAILY  . aspirin EC 81 MG tablet Take 1 tablet (81 mg total) by mouth daily.  . baclofen (LIORESAL) 10 MG tablet Take 1 tablet (10 mg total) by mouth 3 (three) times daily. (Patient taking differently: Take 10 mg by mouth daily as needed for muscle spasms. )  . hydrALAZINE (APRESOLINE) 50 MG tablet Take 75 mg by mouth 3 (three) times daily.  Marland Kitchen labetalol (NORMODYNE) 100 MG tablet TAKE 1 TABLET BY MOUTH TWO  TIMES DAILY  . LINZESS 72 MCG capsule TAKE 1 CAPSULE BY MOUTH  DAILY BEFORE BREAKFAST  . lisinopril (PRINIVIL,ZESTRIL) 40 MG tablet TAKE 1 TABLET BY MOUTH  DAILY  . Multiple  Vitamins-Minerals (ONE-A-DAY MENS 50+ ADVANTAGE PO) Take 1 tablet by mouth daily.  . sertraline (ZOLOFT) 50 MG tablet Take 1 tablet (50 mg total) by mouth daily.  . traMADol (ULTRAM) 50 MG tablet Take 1 tablet (50 mg total) by mouth every 6 (six) hours as needed.  Marland Kitchen azithromycin (ZITHROMAX Z-PAK) 250 MG tablet Take 2 tablets (500 mg) on  Day 1,  followed by 1 tablet (250 mg) once daily on Days 2 through 5.  . [DISCONTINUED] labetalol (NORMODYNE) 100 MG tablet Take 1 tablet by mouth two  times daily   No facility-administered encounter medications on file as of 03/12/2018.     Activities of Daily Living In your present state of health, do you have any difficulty performing the following activities: 03/12/2018  Hearing? N  Vision? N  Difficulty concentrating or making decisions? N  Walking or climbing stairs? Y  Dressing or bathing? N  Doing errands, shopping? N  Preparing Food and eating ? N  Using the Toilet? N  In the past six months, have you accidently leaked urine? N  Do you have problems with loss of bowel control? N  Managing your Medications? N  Managing your Finances? N  Housekeeping or managing your Housekeeping? N  Some recent data might be hidden    Patient Care Team: Rennis Golden as PCP - General (Physician Assistant) Herminio Commons, MD as PCP - Cardiology (Cardiology)   Sees Dr. Brett Fairy regarding his sleep apnea.  These are all of the medical providers that he sees.  Assessment:   This is a routine wellness examination for George Pratt.  Exercise Activities and Dietary recommendations Current Exercise Habits: Home exercise routine, Type of exercise: walking, Time (Minutes): 30, Frequency (Times/Week): 3, Weekly Exercise (Minutes/Week): 90, Intensity: Mild    Fall Risk Fall Risk  03/08/2017  Falls in the past year? No   Is the patient's home free of loose throw rugs in walkways, pet beds, electrical cords, etc?   yes      Grab bars in the bathroom? no       Handrails on the stairs?   yes      Adequate lighting?   yes   Depression Screen PHQ 2/9 Scores 03/12/2018 11/27/2017 08/02/2017 03/08/2017  PHQ - 2 Score 0 0 0 0  PHQ- 9 Score - - - 0    Cognitive Function     6CIT Screen 03/12/2018  What Year? 0 points  What time?  0 points  Count back from 20 0 points  Months in reverse 0 points  Repeat phrase 4 points    Immunization History  Administered Date(s) Administered  . Influenza,inj,Quad PF,6+ Mos 03/02/2016, 03/08/2017  . Tdap 05/30/2010      Screening Tests Health Maintenance  Topic Date Due  . INFLUENZA VACCINE  12/28/2017  . TETANUS/TDAP  05/30/2020  . COLONOSCOPY  09/30/2025  . Hepatitis C Screening  Completed  . HIV Screening  Completed        Plan:     I have personally reviewed and noted the following in the patient's chart:   . Medical and social history . Use of alcohol, tobacco or illicit drugs  . Current medications and supplements . Functional ability and status . Nutritional status . Physical activity . Advanced directives . List of other physicians . Hospitalizations, surgeries, and ER visits in previous 12 months . Vitals . Screenings to include cognitive, depression, and falls . Referrals and appointments  In addition, I have reviewed and discussed with patient certain preventive protocols, quality metrics, and best practice recommendations. A written personalized care plan for preventive services as well as general preventive health recommendations were provided to patient.        Physical Exam: Blood pressure 116/60, pulse 64, temperature 97.8 F (36.6 C), temperature source Oral, resp. rate 16, height 6\' 1"  (1.854 m), weight 101.2 kg, SpO2 96 %.  General: Well developed, well nourished WM. Appears  in no acute distress. HEENT: Normocephalic, atraumatic. Conjunctiva pink, sclera non-icteric. Pupils 2 mm constricting to 1 mm, round, regular, and equally reactive to light and  accomodation. EOMI. Internal auditory canal clear. TMs with good cone of light and without pathology. Nasal mucosa pink. Nares are without discharge. No sinus tenderness. Oral mucosa pink. Pharynx without exudate.   Neck: Supple. Trachea midline. No thyromegaly. Full ROM. No lymphadenopathy. Lungs: Clear to auscultation bilaterally without wheezes, rales, or rhonchi. Breathing is of normal effort and unlabored. Cardiovascular: RRR with S1 S2. No murmurs, rubs, or gallops. Distal pulses 2+ symmetrically. No carotid or abdominal bruits. Abdomen: Soft, non-tender, non-distended with normoactive bowel sounds. No hepatosplenomegaly or masses. No rebound/guarding. No CVA tenderness. No hernias. Musculoskeletal: Full range of motion and 5/5 strength throughout.  Skin: Warm and moist without erythema, ecchymosis, wounds, or rash. Neuro: A+Ox3. CN II-XII grossly intact. Moves all extremities spontaneously. Full sensation throughout. Normal gait.  Psych:  Responds to questions appropriately with a normal affect.   Assessment/Plan:  57 y.o. y/o  male here for CPE  -1. Medicare annual wellness visit, subsequent ----See documentation above  2. Encounter for preventive health examination  A. Screening Labs: - CBC with Differential/Platelet - COMPLETE METABOLIC PANEL WITH GFR - Lipid panel - PSA  B. Screening For Prostate Cancer: - PSA  C. Screening For Colorectal Cancer:  He had colonoscopy 10/01/2015  D. Immunizations: Flu------------------------- currently has a respiratory infection.  Once this is resolved and then he can return for flu vaccine. Tetanus------------------UpToDate Pneumococcal-------- He has no indication to require pneumonia vaccine until age 76 Shingrix------------------ is to check with his specific insurance plan regarding coverage and cost and then get this at pharmacy.   Essential hypertension Blood Pressure is well controlled.  Continue current medication.  Check lab to  monitor. - COMPLETE METABOLIC PANEL WITH GFR  OSA on CPAP He is compliant with using his CPAP every night.  This is managed by Dr. Brett Fairy.  Prostate cancer screening - PSA  Generalized anxiety disorder  This is well controlled with Zoloft.  Continue current dose of Zoloft.  10. Bacterial respiratory infection He reports that he has been having cough productive of thick dark green phlegm for almost 1 month.  He is to take the azithromycin as directed.  Follow-up if symptoms do not resolve within 1 week after completion of antibiotic. - azithromycin (ZITHROMAX Z-PAK) 250 MG tablet; Take 2 tablets (500 mg) on  Day 1,  followed by 1 tablet (250 mg) once daily on Days 2 through 5.  Dispense: 6 each; Refill: 0   Signed:   [address] [place], [place]  03/12/2018 9:44 AM

## 2018-03-14 DIAGNOSIS — R739 Hyperglycemia, unspecified: Secondary | ICD-10-CM | POA: Insufficient documentation

## 2018-03-15 LAB — CBC WITH DIFFERENTIAL/PLATELET
BASOS ABS: 78 {cells}/uL (ref 0–200)
Basophils Relative: 0.7 %
EOS PCT: 12.6 %
Eosinophils Absolute: 1399 cells/uL — ABNORMAL HIGH (ref 15–500)
HCT: 41.3 % (ref 38.5–50.0)
Hemoglobin: 14.1 g/dL (ref 13.2–17.1)
Lymphs Abs: 1499 cells/uL (ref 850–3900)
MCH: 30 pg (ref 27.0–33.0)
MCHC: 34.1 g/dL (ref 32.0–36.0)
MCV: 87.9 fL (ref 80.0–100.0)
MONOS PCT: 7.9 %
MPV: 10.1 fL (ref 7.5–12.5)
NEUTROS ABS: 7248 {cells}/uL (ref 1500–7800)
Neutrophils Relative %: 65.3 %
PLATELETS: 271 10*3/uL (ref 140–400)
RBC: 4.7 10*6/uL (ref 4.20–5.80)
RDW: 13.1 % (ref 11.0–15.0)
Total Lymphocyte: 13.5 %
WBC mixed population: 877 cells/uL (ref 200–950)
WBC: 11.1 10*3/uL — ABNORMAL HIGH (ref 3.8–10.8)

## 2018-03-15 LAB — TEST AUTHORIZATION

## 2018-03-15 LAB — COMPLETE METABOLIC PANEL WITH GFR
AG Ratio: 1.9 (calc) (ref 1.0–2.5)
ALKALINE PHOSPHATASE (APISO): 70 U/L (ref 40–115)
ALT: 12 U/L (ref 9–46)
AST: 12 U/L (ref 10–35)
Albumin: 4.4 g/dL (ref 3.6–5.1)
BUN: 11 mg/dL (ref 7–25)
CO2: 25 mmol/L (ref 20–32)
CREATININE: 1.1 mg/dL (ref 0.70–1.33)
Calcium: 8.9 mg/dL (ref 8.6–10.3)
Chloride: 109 mmol/L (ref 98–110)
GFR, Est African American: 85 mL/min/{1.73_m2} (ref >=60)
GFR, Est Non African American: 73 mL/min/{1.73_m2} (ref >=60)
GLUCOSE: 111 mg/dL — AB (ref 65–99)
Globulin: 2.3 g/dL (calc) (ref 1.9–3.7)
Potassium: 4 mmol/L (ref 3.5–5.3)
Sodium: 143 mmol/L (ref 135–146)
Total Bilirubin: 0.5 mg/dL (ref 0.2–1.2)
Total Protein: 6.7 g/dL (ref 6.1–8.1)

## 2018-03-15 LAB — LIPID PANEL
CHOL/HDL RATIO: 4 (calc) (ref <5.0)
CHOLESTEROL: 173 mg/dL (ref <200)
HDL: 43 mg/dL (ref >40)
LDL CHOLESTEROL (CALC): 116 mg/dL — AB
Non-HDL Cholesterol (Calc): 130 mg/dL (calc) — ABNORMAL HIGH (ref <130)
TRIGLYCERIDES: 46 mg/dL (ref <150)

## 2018-03-15 LAB — HEMOGLOBIN A1C W/OUT EAG: Hgb A1c MFr Bld: 5 % of total Hgb (ref <5.7)

## 2018-03-15 LAB — PSA: PSA: 0.9 ng/mL (ref <=4.0)

## 2018-03-21 ENCOUNTER — Other Ambulatory Visit: Payer: Self-pay

## 2018-03-21 MED ORDER — ATORVASTATIN CALCIUM 40 MG PO TABS: 40.0000 mg | ORAL_TABLET | Freq: Every day | ORAL | 3 refills | Status: DC

## 2018-04-02 ENCOUNTER — Ambulatory Visit (INDEPENDENT_AMBULATORY_CARE_PROVIDER_SITE_OTHER): Payer: Medicare Other

## 2018-04-02 DIAGNOSIS — Z23 Encounter for immunization: Secondary | ICD-10-CM

## 2018-04-02 NOTE — Progress Notes (Signed)
Patient was in office for flu vaccine patient received vaccin in his left deltoid patient tolerated well

## 2018-07-11 ENCOUNTER — Other Ambulatory Visit: Payer: Self-pay | Admitting: *Deleted

## 2018-07-11 MED ORDER — AMLODIPINE BESYLATE 10 MG PO TABS: 10.0000 mg | ORAL_TABLET | Freq: Every day | ORAL | 0 refills | Status: DC

## 2018-07-11 MED ORDER — LABETALOL HCL 100 MG PO TABS: 100.0000 mg | ORAL_TABLET | Freq: Two times a day (BID) | ORAL | 0 refills | Status: DC

## 2018-07-11 MED ORDER — HYDRALAZINE HCL 50 MG PO TABS: 75.0000 mg | ORAL_TABLET | Freq: Three times a day (TID) | ORAL | 0 refills | Status: DC

## 2018-07-11 MED ORDER — LISINOPRIL 40 MG PO TABS: 40.0000 mg | ORAL_TABLET | Freq: Every day | ORAL | 0 refills | Status: DC

## 2018-08-02 ENCOUNTER — Encounter: Payer: Self-pay | Admitting: Cardiovascular Disease

## 2018-08-02 ENCOUNTER — Ambulatory Visit: Payer: 59 | Admitting: Cardiovascular Disease

## 2018-08-02 VITALS — BP 124/68 | HR 74 | Ht 72.0 in | Wt 231.0 lb

## 2018-08-02 DIAGNOSIS — I10 Essential (primary) hypertension: Secondary | ICD-10-CM

## 2018-08-02 DIAGNOSIS — I77810 Thoracic aortic ectasia: Secondary | ICD-10-CM | POA: Diagnosis not present

## 2018-08-02 DIAGNOSIS — E669 Obesity, unspecified: Secondary | ICD-10-CM | POA: Diagnosis not present

## 2018-08-02 DIAGNOSIS — G4733 Obstructive sleep apnea (adult) (pediatric): Secondary | ICD-10-CM

## 2018-08-02 NOTE — Progress Notes (Signed)
SUBJECTIVE: The patient presents for routine follow-up.  He underwent a nuclear stress test on 11/07/2017.  This demonstrated a hypertensive response to exercise.  Soft tissue attenuation was noted with no significant ischemia.  Regional wall motion was normal.  It was a low risk study, EF 61%. He has mild sleep apnea but has not been using CPAP.  He has accelerated hypertension.  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  His wife is also pursuing a doctor in behavioral health.  The patient denies any symptoms of chest pain, palpitations, shortness of breath, lightheadedness, dizziness, leg swelling, orthopnea, PND, and syncope.  He said when stress levels are high systolic blood pressure can go to 140.  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  He likes restoring cars.  He also inherited his father's workshop where he works with wood.  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 His father is in his 27's and is a retired Lexicographer who practiced for 28 years in [place]. His father lives with his family now.  Review of Systems: As per "subjective", otherwise negative.  Allergies  Allergen Reactions  . Bee Venom Swelling    Current Outpatient Medications  Medication Sig Dispense Refill  . amLODipine (NORVASC) 10 MG tablet Take 1 tablet (10 mg total) by mouth daily. 90 tablet 0  . aspirin EC 81 MG tablet Take 1 tablet (81 mg total) by mouth daily.    Marland Kitchen atorvastatin (LIPITOR) 40 MG tablet Take 1 tablet (40 mg total) by mouth daily. 90 tablet 3  . baclofen (LIORESAL) 10 MG tablet Take 1 tablet (10 mg total) by mouth 3 (three) times daily. (Patient taking differently: Take 10 mg by mouth daily as needed for muscle  spasms. ) 90 tablet 1  . hydrALAZINE (APRESOLINE) 50 MG tablet Take 1.5 tablets (75 mg total) by mouth 3 (three) times daily. 135 tablet 0  . labetalol (NORMODYNE) 100 MG tablet Take 1 tablet (100 mg total) by mouth 2 (two) times daily. 180 tablet 0  . LINZESS 72 MCG capsule TAKE 1 CAPSULE BY MOUTH  DAILY BEFORE BREAKFAST 90 capsule 0  . lisinopril (PRINIVIL,ZESTRIL) 40 MG tablet Take 1 tablet (40 mg total) by mouth daily. 90 tablet 0  . Multiple Vitamins-Minerals (ONE-A-DAY MENS 50+ ADVANTAGE PO) Take 1 tablet by mouth daily.    . sertraline (ZOLOFT) 50 MG tablet Take 1 tablet (50 mg total) by mouth daily. 90 tablet 1  . traMADol (ULTRAM) 50 MG tablet Take 1 tablet (50 mg total) by mouth every 6 (six) hours as needed. 15 tablet 0   No current facility-administered medications for this visit.     Past Medical History:  Diagnosis Date  . Hypertension   . Sleep apnea     Past Surgical History:  Procedure Laterality Date  . APPENDECTOMY  2006  . COLONOSCOPY N/A 10/01/2015   Procedure: COLONOSCOPY;  Surgeon: Rogene Houston, MD;  [facility];  Service: Endoscopy;  Laterality: N/A;  1030  . KNEE SURGERY    . NECK SURGERY  2000    Social History   Socioeconomic History  . Marital status: Married    Spouse name: Not on file  . Number of children: Not on file  .  Years of education: Not on file  . Highest education level: Not on file  Occupational History  . Not on file  Social Needs  . Financial resource strain: Not on file  . Food insecurity:    Worry: Not on file    Inability: Not on file  . Transportation needs:    Medical: Not on file    Non-medical: Not on file  Tobacco Use  . Smoking status: Never Smoker  . Smokeless tobacco: Never Used  Substance and Sexual Activity  . Alcohol use: No    Alcohol/week: 0.0 standard drinks  . Drug use: No  . Sexual activity: Yes  Lifestyle  . Physical activity:    Days per week: Not on file    Minutes per session: Not on  file  . Stress: Not on file  Relationships  . Social connections:    Talks on phone: Not on file    Gets together: Not on file    Attends religious service: Not on file    Active member of club or organization: Not on file    Attends meetings of clubs or organizations: Not on file    Relationship status: Not on file  . Intimate partner violence:    Fear of current or ex partner: Not on file    Emotionally abused: Not on file    Physically abused: Not on file    Forced sexual activity: Not on file  Other Topics Concern  . 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     Vitals:   08/02/18 1440  BP: 124/68  Pulse: 74  SpO2: 98%  Weight: 231 lb (104.8 kg)  Height: 6' (1.829 m)    Wt Readings from Last 3 Encounters:  08/02/18 231 lb (104.8 kg)  03/12/18 223 lb (101.2 kg)  01/17/18 221 lb (100.2 kg)     PHYSICAL EXAM General: NAD HEENT: Normal. Neck: No JVD, no thyromegaly. Lungs: Clear to auscultation bilaterally with normal respiratory effort. CV: Regular rate and rhythm, normal S1/S2, no S3/S4, no murmur. No pretibial or periankle edema.  No carotid bruit.   Abdomen: Soft, nontender, no distention.  Neurologic: Alert and oriented.  Psych: Normal affect. Skin: Normal. Musculoskeletal: No gross deformities.    ECG: Reviewed above under Subjective   Labs: Lab Results  Component Value Date/Time   K 4.0 03/12/2018 09:43 AM   BUN 11 03/12/2018 09:43 AM   CREATININE 1.10 03/12/2018 09:43 AM   ALT 12 03/12/2018 09:43 AM   TSH 2.30 03/08/2017 09:39 AM   HGB 14.1 03/12/2018 09:43 AM     Lipids: Lab Results  Component Value Date/Time   LDLCALC 116 (H) 03/12/2018 09:43 AM   CHOL 173 03/12/2018 09:43 AM   TRIG 46 03/12/2018 09:43 AM   HDL 43 03/12/2018  09:43 AM       ASSESSMENT AND PLAN: 1. Accelerated hypertension: Blood pressure is normal. Continue amlodipine, hydralazine, labetalol, and lisinopril.  2. Aortic root dilatation: Most recent chest CT deviously reviewed from August 2017 measuring 4.2 cm in diameter. He prefers to avoid further testing at this time.  3. Obesity: He is up to 231 pounds.  We talked about exercise for weight loss.  4.  Obstructive sleep  apnea: He has not been using CPAP.     Disposition: Follow up 1 year   Kate Sable, M.D., F.A.C.C.

## 2018-08-02 NOTE — Patient Instructions (Addendum)

## 2018-08-16 ENCOUNTER — Other Ambulatory Visit: Payer: Self-pay | Admitting: Family Medicine

## 2018-08-16 DIAGNOSIS — M545 Low back pain, unspecified: Secondary | ICD-10-CM

## 2018-08-16 MED ORDER — BACLOFEN 10 MG PO TABS: 10.0000 mg | ORAL_TABLET | Freq: Every day | ORAL | 0 refills | Status: DC | PRN

## 2018-08-16 NOTE — Telephone Encounter (Signed)
Requesting refill on Baclofen (MBD Patient)  LOV: 03/12/18  LRF: 07/27/18

## 2018-09-20 ENCOUNTER — Ambulatory Visit: Payer: Self-pay | Admitting: Family Medicine

## 2018-11-09 ENCOUNTER — Other Ambulatory Visit: Payer: Self-pay

## 2018-11-09 ENCOUNTER — Other Ambulatory Visit: Payer: Self-pay | Admitting: Family Medicine

## 2018-11-09 DIAGNOSIS — M545 Low back pain, unspecified: Secondary | ICD-10-CM

## 2018-11-09 MED ORDER — LABETALOL HCL 100 MG PO TABS: 100.0000 mg | ORAL_TABLET | Freq: Two times a day (BID) | ORAL | 3 refills | Status: DC

## 2018-11-09 MED ORDER — LISINOPRIL 40 MG PO TABS: 40.0000 mg | ORAL_TABLET | Freq: Every day | ORAL | 3 refills | Status: DC

## 2018-11-09 MED ORDER — HYDRALAZINE HCL 50 MG PO TABS: 75.0000 mg | ORAL_TABLET | Freq: Three times a day (TID) | ORAL | 3 refills | Status: DC

## 2018-11-09 MED ORDER — AMLODIPINE BESYLATE 10 MG PO TABS: 10.0000 mg | ORAL_TABLET | Freq: Every day | ORAL | 3 refills | Status: DC

## 2018-11-09 NOTE — Telephone Encounter (Signed)
Ok to refill 

## 2018-11-12 MED ORDER — BACLOFEN 10 MG PO TABS: 10.0000 mg | ORAL_TABLET | Freq: Every day | ORAL | 0 refills | Status: AC | PRN

## 2018-12-25 ENCOUNTER — Other Ambulatory Visit: Payer: Self-pay | Admitting: *Deleted

## 2018-12-25 DIAGNOSIS — F411 Generalized anxiety disorder: Secondary | ICD-10-CM

## 2018-12-25 MED ORDER — SERTRALINE HCL 50 MG PO TABS: 50.0000 mg | ORAL_TABLET | Freq: Every day | ORAL | 0 refills | Status: DC

## 2018-12-26 ENCOUNTER — Other Ambulatory Visit: Payer: Self-pay | Admitting: Family Medicine

## 2018-12-26 DIAGNOSIS — F411 Generalized anxiety disorder: Secondary | ICD-10-CM

## 2018-12-26 MED ORDER — SERTRALINE HCL 50 MG PO TABS: 50.0000 mg | ORAL_TABLET | Freq: Every day | ORAL | 0 refills | Status: DC

## 2019-01-21 ENCOUNTER — Ambulatory Visit: Payer: 59 | Admitting: Neurology

## 2019-02-13 ENCOUNTER — Other Ambulatory Visit: Payer: Self-pay | Admitting: Family Medicine

## 2019-02-13 DIAGNOSIS — F411 Generalized anxiety disorder: Secondary | ICD-10-CM

## 2019-02-13 MED ORDER — SERTRALINE HCL 50 MG PO TABS: 50.0000 mg | ORAL_TABLET | Freq: Every day | ORAL | 0 refills | Status: DC

## 2019-03-18 ENCOUNTER — Encounter: Payer: Medicare Other | Admitting: Physician Assistant

## 2019-04-30 ENCOUNTER — Other Ambulatory Visit: Payer: Self-pay | Admitting: Family Medicine

## 2019-05-07 ENCOUNTER — Telehealth (INDEPENDENT_AMBULATORY_CARE_PROVIDER_SITE_OTHER): Payer: Medicare Other | Admitting: Family Medicine

## 2019-05-07 ENCOUNTER — Encounter: Payer: Self-pay | Admitting: Family Medicine

## 2019-05-07 DIAGNOSIS — Z125 Encounter for screening for malignant neoplasm of prostate: Secondary | ICD-10-CM | POA: Diagnosis not present

## 2019-05-07 DIAGNOSIS — I10 Essential (primary) hypertension: Secondary | ICD-10-CM

## 2019-05-07 DIAGNOSIS — Z9989 Dependence on other enabling machines and devices: Secondary | ICD-10-CM

## 2019-05-07 DIAGNOSIS — G4733 Obstructive sleep apnea (adult) (pediatric): Secondary | ICD-10-CM | POA: Diagnosis not present

## 2019-05-07 DIAGNOSIS — F411 Generalized anxiety disorder: Secondary | ICD-10-CM

## 2019-05-07 NOTE — Progress Notes (Signed)
Subjective:    Patient ID: George Pratt, male    DOB: 1961/02/09, 58 y.o.   MRN: [id number]  HPI Patient is a very pleasant 58 year old Caucasian male being seen today as a video visit.  Phone call began at 3:00.  Phone call concluded at 315.  Patient consents to be seen via video messaging system.  Patient's past medical history is significant for hypertension.  He is checking his blood pressure daily at home and finds that his blood pressure is averaging between 120 and 130/70-80.  He denies any chest pain.  He denies any dyspnea on exertion.  He denies any shortness of breath at rest.  He denies any peripheral edema.  He denies any palpitations or syncope or near syncope.  Patient had a stress test performed last year which showed ejection fraction greater than 60% with no evidence of ischemia or infarction.  Patient has mild obstructive sleep apnea with apnea-hypopnea index of 9.  However he states that he has not been wearing his CPAP.  Instead he tries to sleep on his side and as long as he sleeps on his side he does not feel that he is having any apnea.  He denies waking up gasping for air.  He denies feeling hypersomnolence or fatigue during the day.  He also has generalized anxiety disorder for which she was prescribed Zoloft last year by my partner shortly before she retired.  Patient however does not see any benefit from the Zoloft.  He cannot tell that the medication is helping with anxiety or helping with depression.  Essentially feels the medicine is causing no benefit at all.  He would be willing to try to wean off the medication.  Patient's last colonoscopy was in 2017.  He is overdue for prostate cancer screening.  He is also due for a flu shot. Past Medical History:  Diagnosis Date  . Hypertension   . Sleep apnea    Past Surgical History:  Procedure Laterality Date  . APPENDECTOMY  2006  . COLONOSCOPY N/A 10/01/2015   Procedure: COLONOSCOPY;  Surgeon: Rogene Houston, MD;   [facility];  Service: Endoscopy;  Laterality: N/A;  1030  . KNEE SURGERY    . NECK SURGERY  2000   Current Outpatient Medications on File Prior to Visit  Medication Sig Dispense Refill  . amLODipine (NORVASC) 10 MG tablet Take 1 tablet (10 mg total) by mouth daily. 90 tablet 3  . aspirin EC 81 MG tablet Take 1 tablet (81 mg total) by mouth daily.    Marland Kitchen atorvastatin (LIPITOR) 40 MG tablet TAKE 1 TABLET BY MOUTH DAILY 30 tablet 0  . baclofen (LIORESAL) 10 MG tablet Take 1 tablet (10 mg total) by mouth daily as needed for muscle spasms. 90 tablet 0  . hydrALAZINE (APRESOLINE) 50 MG tablet Take 1.5 tablets (75 mg total) by mouth 3 (three) times daily. 135 tablet 3  . labetalol (NORMODYNE) 100 MG tablet Take 1 tablet (100 mg total) by mouth 2 (two) times daily. 180 tablet 3  . LINZESS 72 MCG capsule TAKE 1 CAPSULE BY MOUTH  DAILY BEFORE BREAKFAST 90 capsule 0  . lisinopril (ZESTRIL) 40 MG tablet Take 1 tablet (40 mg total) by mouth daily. 90 tablet 3  . Multiple Vitamins-Minerals (ONE-A-DAY MENS 50+ ADVANTAGE PO) Take 1 tablet by mouth daily.    . sertraline (ZOLOFT) 50 MG tablet Take 1 tablet (50 mg total) by mouth daily. 90 tablet 0  . traMADol (  ULTRAM) 50 MG tablet Take 1 tablet (50 mg total) by mouth every 6 (six) hours as needed. 15 tablet 0   No current facility-administered medications on file prior to visit.    Allergies  Allergen Reactions  . Bee Venom Swelling   Social History   Socioeconomic History  . Marital status: Married    Spouse name: Not on file  . Number of children: Not on file  . Years of education: Not on file  . Highest education level: Not on file  Occupational History  . Not on file  Social Needs  . Financial resource strain: Not on file  . Food insecurity    Worry: Not on file    Inability: Not on file  . Transportation needs    Medical: Not on file    Non-medical: Not on file  Tobacco Use  . Smoking status: Never Smoker  . Smokeless  tobacco: Never Used  Substance and Sexual Activity  . Alcohol use: No    Alcohol/week: 0.0 standard drinks  . Drug use: No  . Sexual activity: Yes  Lifestyle  . Physical activity    Days per week: Not on file    Minutes per session: Not on file  . Stress: Not on file  Relationships  . Social Herbalist on phone: Not on file    Gets together: Not on file    Attends religious service: Not on file    Active member of club or organization: Not on file    Attends meetings of clubs or organizations: Not on file    Relationship status: Not on file  . Intimate partner violence    Fear of current or ex partner: Not on file    Emotionally abused: Not on file    Physically abused: Not on file    Forced sexual activity: Not on file  Other Topics Concern  . 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      Review of Systems  All other systems reviewed and are negative.      Objective:   Physical Exam Constitutional:      General: He is not in acute distress.    Appearance: Normal appearance. He is not ill-appearing, toxic-appearing or diaphoretic.  Neurological:     Mental Status: He is alert.    Physical exam is limited by the fact the patient is seen via video message.  He denies any peripheral edema.  He states that his heart rate is regular and his blood pressure is 130/70.       Assessment & Plan:  HTN (hypertension), malignant - Plan: CBC with Differential, COMPLETE METABOLIC PANEL WITH GFR, Lipid Panel  OSA on CPAP  Generalized anxiety disorder  Prostate cancer screening - Plan: PSA  I have asked the patient to come in at his earliest convenience fasting for lab work.  I would like to check a CMP to monitor his renal function on his blood  pressure medication.  I would also like to check his fasting blood sugar since he has a history of hyperglycemia.  I would like to check a fasting lipid panel to ensure that his LDL cholesterol is less than 100.  I would also  like to screen the patient for prostate cancer with a PSA.  At that visit, the patient could receive his flu shot.  Regarding his generalized anxiety disorder, I have recommended that he gradually wean off Zoloft since he perceives no benefit.  He could decrease to 1/2 tablet/day for 1 week and then 1/2 tablet every other day for 1 to 2 weeks and then stop.  He is only taking 50 mg.

## 2019-06-06 ENCOUNTER — Other Ambulatory Visit: Payer: Self-pay | Admitting: Family Medicine

## 2019-06-11 ENCOUNTER — Encounter: Payer: Self-pay | Admitting: Family Medicine

## 2019-06-12 ENCOUNTER — Other Ambulatory Visit: Payer: Self-pay

## 2019-06-12 ENCOUNTER — Other Ambulatory Visit: Payer: Medicare Other

## 2019-06-12 ENCOUNTER — Ambulatory Visit (INDEPENDENT_AMBULATORY_CARE_PROVIDER_SITE_OTHER): Payer: Medicare Other

## 2019-06-12 DIAGNOSIS — Z23 Encounter for immunization: Secondary | ICD-10-CM

## 2019-06-15 LAB — CBC WITH DIFFERENTIAL/PLATELET
Absolute Monocytes: 594 cells/uL (ref 200–950)
Basophils Absolute: 73 cells/uL (ref 0–200)
Basophils Relative: 1.1 %
Eosinophils Absolute: 376 cells/uL (ref 15–500)
Eosinophils Relative: 5.7 %
HCT: 44.2 % (ref 38.5–50.0)
Hemoglobin: 14.8 g/dL (ref 13.2–17.1)
Lymphs Abs: 1525 cells/uL (ref 850–3900)
MCH: 28.9 pg (ref 27.0–33.0)
MCHC: 33.5 g/dL (ref 32.0–36.0)
MCV: 86.3 fL (ref 80.0–100.0)
MPV: 10 fL (ref 7.5–12.5)
Monocytes Relative: 9 %
Neutro Abs: 4033 cells/uL (ref 1500–7800)
Neutrophils Relative %: 61.1 %
Platelets: 283 10*3/uL (ref 140–400)
RBC: 5.12 10*6/uL (ref 4.20–5.80)
RDW: 12.7 % (ref 11.0–15.0)
Total Lymphocyte: 23.1 %
WBC: 6.6 10*3/uL (ref 3.8–10.8)

## 2019-06-15 LAB — COMPLETE METABOLIC PANEL WITH GFR
AG Ratio: 2 (calc) (ref 1.0–2.5)
ALT: 16 U/L (ref 9–46)
AST: 13 U/L (ref 10–35)
Albumin: 4.3 g/dL (ref 3.6–5.1)
Alkaline phosphatase (APISO): 82 U/L (ref 35–144)
BUN/Creatinine Ratio: 13 (calc) (ref 6–22)
BUN: 16 mg/dL (ref 7–25)
CO2: 27 mmol/L (ref 20–32)
Calcium: 9.2 mg/dL (ref 8.6–10.3)
Chloride: 106 mmol/L (ref 98–110)
Creat: 1.27 mg/dL — ABNORMAL HIGH (ref 0.70–1.25)
GFR, Est African American: 71 mL/min/{1.73_m2} (ref >=60)
GFR, Est Non African American: 61 mL/min/{1.73_m2} (ref >=60)
Globulin: 2.2 g/dL (calc) (ref 1.9–3.7)
Glucose, Bld: 130 mg/dL — ABNORMAL HIGH (ref 65–99)
Potassium: 3.8 mmol/L (ref 3.5–5.3)
Sodium: 142 mmol/L (ref 135–146)
Total Bilirubin: 0.7 mg/dL (ref 0.2–1.2)
Total Protein: 6.5 g/dL (ref 6.1–8.1)

## 2019-06-15 LAB — PSA: PSA: 0.8 ng/mL (ref <=4.0)

## 2019-06-15 LAB — LIPID PANEL
Cholesterol: 165 mg/dL (ref <200)
HDL: 37 mg/dL — ABNORMAL LOW (ref >=40)
LDL Cholesterol (Calc): 110 mg/dL (calc) — ABNORMAL HIGH
Non-HDL Cholesterol (Calc): 128 mg/dL (calc) (ref <130)
Total CHOL/HDL Ratio: 4.5 (calc) (ref <5.0)
Triglycerides: 86 mg/dL (ref <150)

## 2019-06-15 LAB — TEST AUTHORIZATION

## 2019-06-15 LAB — HEMOGLOBIN A1C W/OUT EAG: Hgb A1c MFr Bld: 5.2 % of total Hgb (ref <5.7)

## 2019-09-18 ENCOUNTER — Other Ambulatory Visit: Payer: Self-pay | Admitting: Cardiovascular Disease

## 2019-11-06 IMAGING — CT CT ANGIO CHEST-ABD-PELV FOR DISSECTION W/ AND WO/W CM
2 of 7 series · 14 of 46 positions shown, 16 images · IV contrast (Isovue)
Comparison: 01/20/2016

CLINICAL DATA: Chest pain radiating down left arm. Dizziness,
weakness.

EXAM:
CT ANGIOGRAPHY CHEST, ABDOMEN AND PELVIS
TECHNIQUE: Multidetector CT imaging through the chest, abdomen and pelvis was
performed using the standard protocol during bolus administration of
intravenous contrast. Multiplanar reconstructed images and MIPs were
obtained and reviewed to evaluate the vascular anatomy.
CONTRAST:  100mL [id number] IOPAMIDOL ([id number]) INJECTION 76%

[Series 5: dissection axial arterial · axial · arterial · 0.79mm/px · z∈[-133,+497]mm · 11 of 241 slices shown, 13 images]
[im 16/241  soft-tissue]
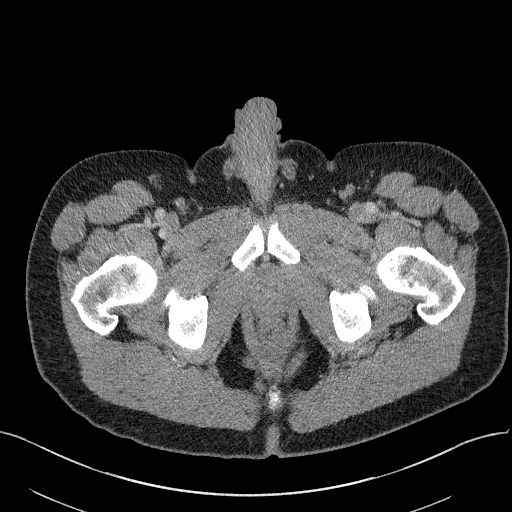
[im 16/241  bone]
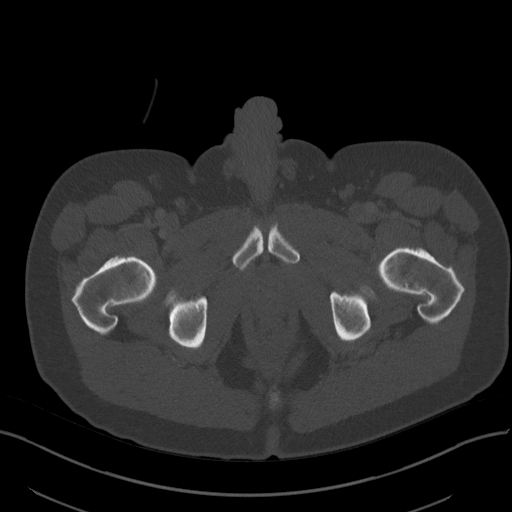
[im 46/241  soft-tissue]
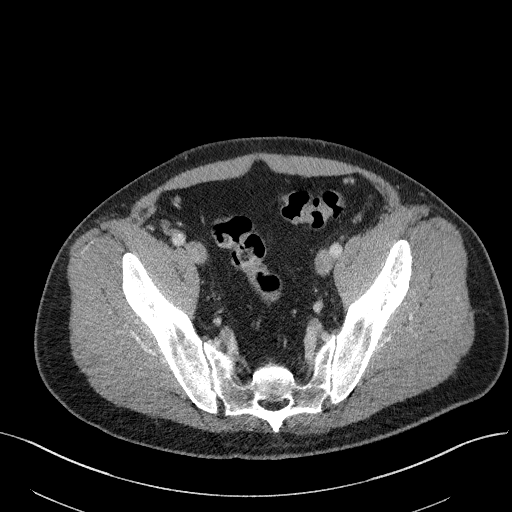
[im 61/241  soft-tissue]
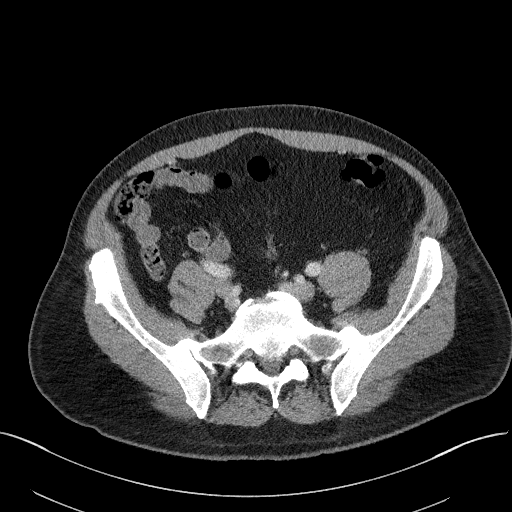
[im 76/241  soft-tissue]
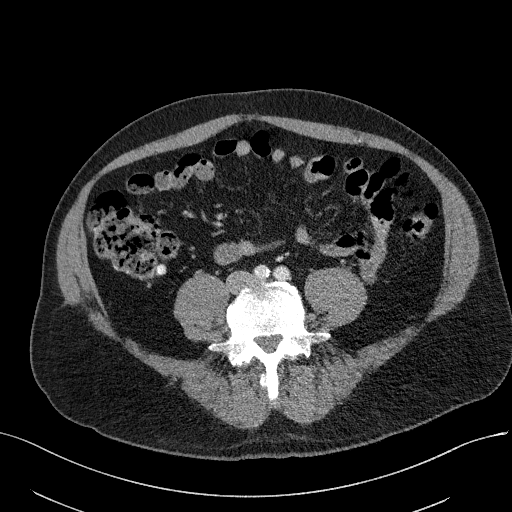
[im 106/241  soft-tissue]
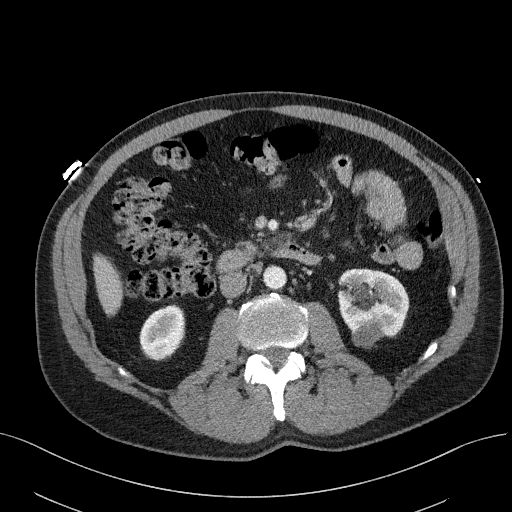
[im 121/241  soft-tissue]
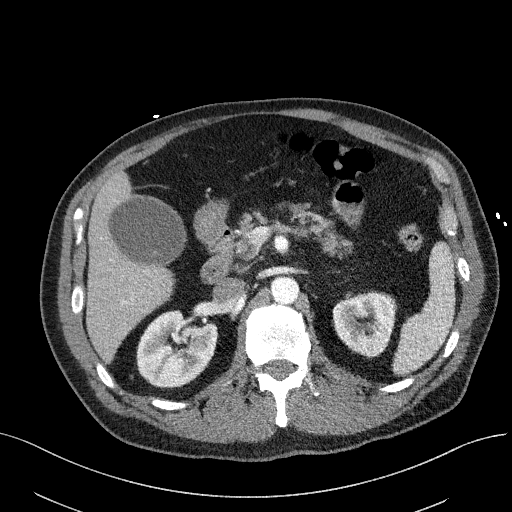
[im 136/241  soft-tissue]
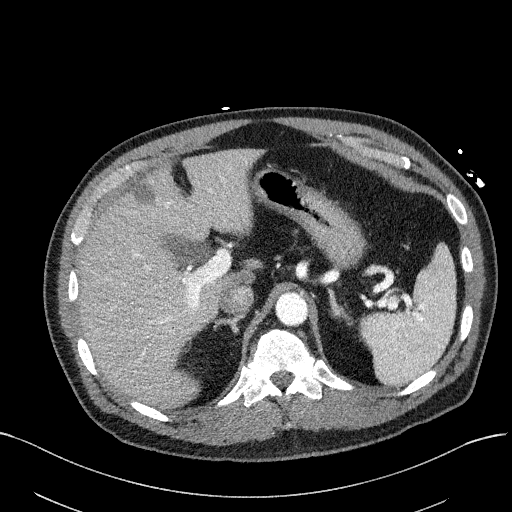
[im 166/241  soft-tissue]
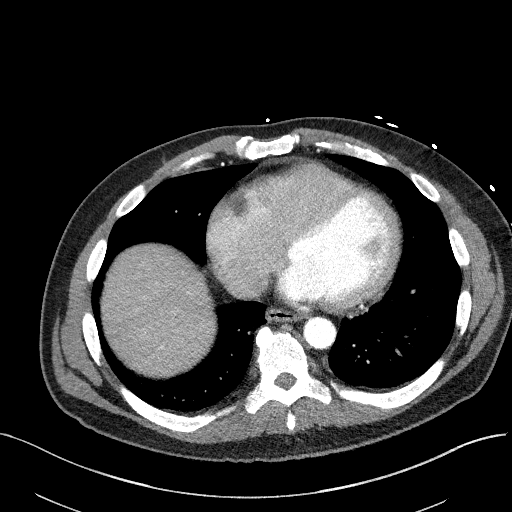
[im 181/241  soft-tissue]
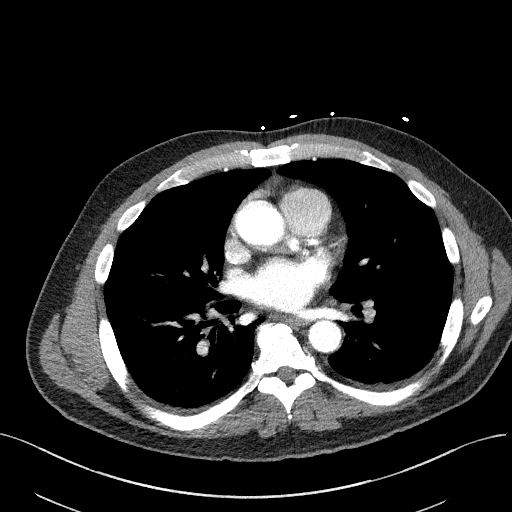
[im 181/241  bone]
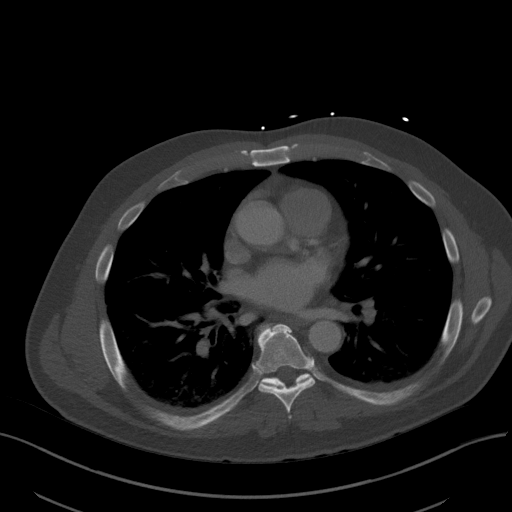
[im 196/241  soft-tissue]
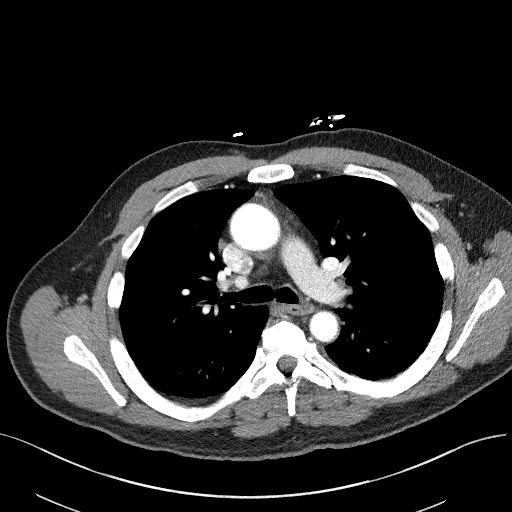
[im 226/241  soft-tissue]
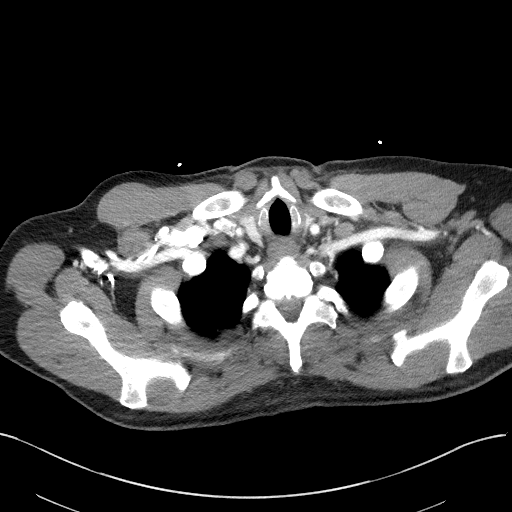

[Series 9: coronals · coronal · 0.74mm/px · 3 of 155 slices shown]
[im 39/155  soft-tissue]
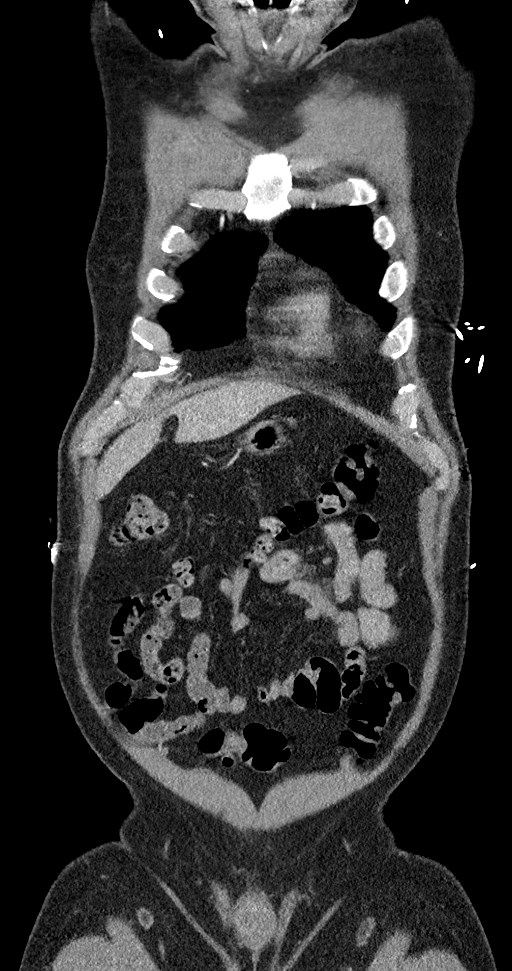
[im 78/155  soft-tissue]
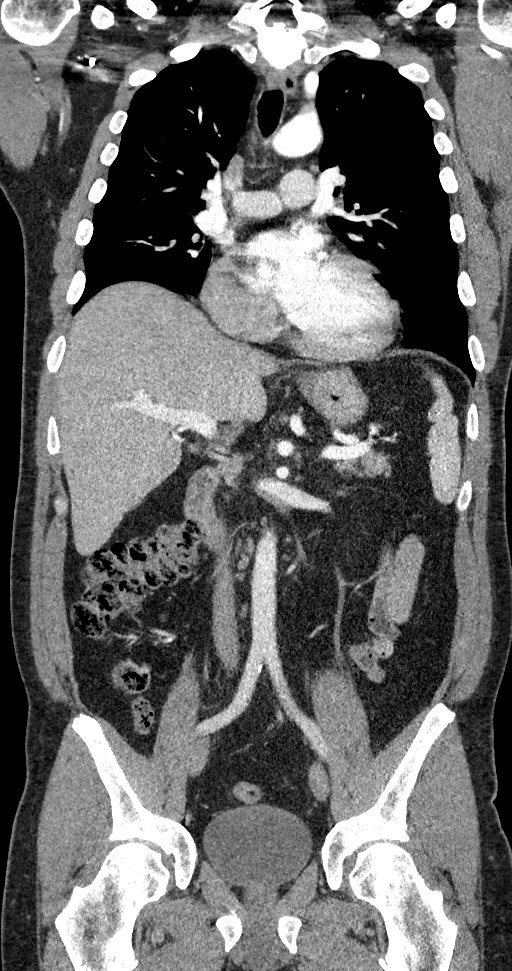
[im 116/155  soft-tissue]
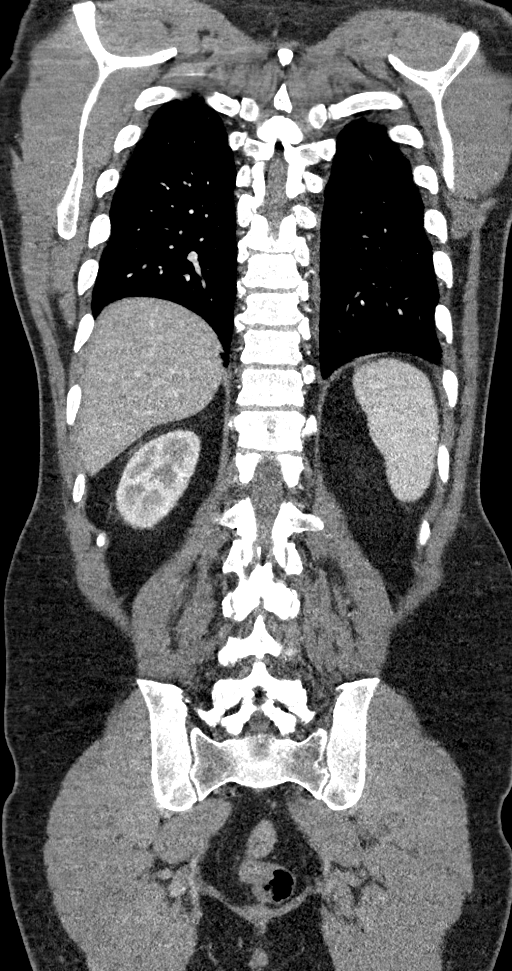

[14 of 46 positions shown; findings below may reference images not displayed]

FINDINGS: CTA CHEST FINDINGS

Cardiovascular: Heart is normal size. No evidence of aortic
dissection. Aortic root remains slightly prominent at 4.0 cm,
stable.

Mediastinum/Nodes: No mediastinal, hilar, or axillary adenopathy.
Thyroid unremarkable. Trachea and esophagus are unremarkable.

Lungs/Pleura: Dependent and bibasilar atelectasis. No effusions. No
confluent opacities.

Musculoskeletal: No acute bony abnormality.

Review of the MIP images confirms the above findings.

CTA ABDOMEN AND PELVIS FINDINGS

VASCULAR

Aorta: Normal caliber. No dissection. Scattered calcifications in
the distal aorta.

Celiac: Widely patent

SMA: Widely patent

Renals: Single bilaterally, widely patent

IMA: Widely patent

Inflow: Scattered calcifications.  No aneurysm or dissection.

Veins: Grossly unremarkable.

Review of the MIP images confirms the above findings.

NON-VASCULAR

Hepatobiliary: Small scattered cysts in the liver are stable.
Gallbladder unremarkable.

Pancreas: No focal abnormality or ductal dilatation.

Spleen: No focal abnormality.  Normal size.

Adrenals/Urinary Tract: No adrenal abnormality. No suspicious focal
renal abnormality. Small cysts in the left kidney. No stones or
hydronephrosis. Urinary bladder is unremarkable.

Stomach/Bowel: Scattered colonic diverticula. No active
diverticulitis. Stomach and small bowel decompressed.

Lymphatic: No adenopathy.

Reproductive: Prostate mildly prominent.

Other: No free fluid or free air.

Musculoskeletal: No acute bony abnormality.

Review of the MIP images confirms the above findings.
IMPRESSION: No evidence of aortic dissection. Slight dilatation of the aortic
root at 4.0 cm, stable.

Dependent and bibasilar atelectasis.

Scattered hepatic and left renal cysts.

Scattered colonic diverticula.

Mildly prominent prostate.

No acute findings in the chest, abdomen or pelvis.

## 2019-11-21 ENCOUNTER — Other Ambulatory Visit: Payer: Self-pay | Admitting: Cardiovascular Disease

## 2019-12-04 ENCOUNTER — Ambulatory Visit: Payer: Medicare Other | Admitting: Cardiovascular Disease

## 2019-12-11 ENCOUNTER — Other Ambulatory Visit: Payer: Self-pay | Admitting: Family Medicine

## 2019-12-18 NOTE — Progress Notes (Signed)
Cardiology Office Note  Date: 12/19/2019   ID: George Pratt, George Pratt 1960/12/10, MRN [id number]  PCP:  Susy Frizzle, MD  Cardiologist:  No primary care provider on file. Electrophysiologist:  None   Chief Complaint: Accelerated hypertension  History of Present Illness: George Pratt is a 59 y.o. male with a history of accelerated hypertension.  OSA not using CPAP.  Previous nuclear stress test 11/07/2017.  No ischemia, low risk, EF 61%.  Last seen by Dr. Bronson Ing 08/02/2018.  He denied any symptoms of chest pain, palpitations, shortness of breath, lightheadedness, dizziness, lower extremity edema, orthopnea, PND, or syncope.  Blood pressure was normal.  He was continuing his amlodipine, hydralazine, labetalol, lisinopril.  Chest CT from August 2017 showed aortic root dilatation of 4.2 cm in diameter.  He preferred to avoid further testing.  He was up to 231 pounds.  Exercise for weight control was discussed.  He was not using his CPAP for OSA.   Patient states overall he has been doing well.  He states he is having issues with labile blood pressures.  He states he believes, for his medications are ceasing to be effective due to significantly elevated blood pressures in the evening starting around 5 PM.  He denies any other anginal or exertional symptoms, palpitations or arrhythmias, orthostatic symptoms, CVA or TIA-like symptoms, PND, orthopnea, lower extremity edema.   Past Medical History:  Diagnosis Date   Hypertension    Sleep apnea     Past Surgical History:  Procedure Laterality Date   APPENDECTOMY  2006   COLONOSCOPY N/A 10/01/2015   Procedure: COLONOSCOPY;  Surgeon: Rogene Houston, MD;  Location: [facility];  Service: Endoscopy;  Laterality: N/A;  1030   KNEE SURGERY     NECK SURGERY  2000    Current Outpatient Medications  Medication Sig Dispense Refill   amLODipine (NORVASC) 10 MG tablet Take 1 tablet (10 mg total) by mouth daily. 90 tablet 3   aspirin  EC 81 MG tablet Take 1 tablet (81 mg total) by mouth daily.     atorvastatin (LIPITOR) 40 MG tablet TAKE 1 TABLET BY MOUTH DAILY 90 tablet 1   hydrALAZINE (APRESOLINE) 50 MG tablet TAKE 1 AND 1/2 TABLETS BY MOUTH THREE TIMES DAILY - 135 tablet 0   labetalol (NORMODYNE) 100 MG tablet Take 1 tablet (100 mg total) by mouth 2 (two) times daily. 180 tablet 3   LINZESS 72 MCG capsule TAKE 1 CAPSULE BY MOUTH  DAILY BEFORE BREAKFAST 90 capsule 0   lisinopril (ZESTRIL) 40 MG tablet Take 1 tablet (40 mg total) by mouth daily. 90 tablet 3   Multiple Vitamins-Minerals (ONE-A-DAY MENS 50+ ADVANTAGE PO) Take 1 tablet by mouth daily.     sertraline (ZOLOFT) 50 MG tablet Take 1 tablet (50 mg total) by mouth daily. 90 tablet 0   traMADol (ULTRAM) 50 MG tablet Take 1 tablet (50 mg total) by mouth every 6 (six) hours as needed. 15 tablet 0   No current facility-administered medications for this visit.   Allergies:  Bee venom   Social History: The patient  reports that he has never smoked. He has never used smokeless tobacco. He reports that he does not drink alcohol and does not use drugs.   Family History: The patient's family history includes Diabetes in his father; Heart disease in his mother; Heart failure in his mother; Hypertension in his brother, brother, brother, father, and mother; Vision loss in his father.  ROS:  Please see the history of present illness. Otherwise, complete review of systems is positive for none.  All other systems are reviewed and negative.   Physical Exam: VS:  BP 120/68    Pulse 72    Ht 6\' 1"  (1.854 m)    Wt 233 lb (105.7 kg)    SpO2 98%    BMI 30.74 kg/m , BMI Body mass index is 30.74 kg/m.  Wt Readings from Last 3 Encounters:  12/19/19 233 lb (105.7 kg)  08/02/18 231 lb (104.8 kg)  03/12/18 223 lb (101.2 kg)    General: Patient appears comfortable at rest. Neck: Supple, no elevated JVP or carotid bruits, no thyromegaly. Lungs: Clear to auscultation, nonlabored  breathing at rest. Cardiac: Regular rate and rhythm, no S3 or significant systolic murmur, no pericardial rub. Extremities: No pitting edema, distal pulses 2+. Skin: Warm and dry. Musculoskeletal: No kyphosis. Neuropsychiatric: Alert and oriented x3, affect grossly appropriate.  ECG:  An ECG dated 12/19/2019 was personally reviewed today and demonstrated:  Normal sinus rhythm rate of 75, incomplete right bundle branch block.  Recent Labwork: 06/12/2019: ALT 16; AST 13; BUN 16; Creat 1.27; Hemoglobin 14.8; Platelets 283; Potassium 3.8; Sodium 142     Component Value Date/Time   CHOL 165 06/12/2019 1049   TRIG 86 06/12/2019 1049   HDL 37 (L) 06/12/2019 1049   CHOLHDL 4.5 06/12/2019 1049   VLDL 13 03/03/2016 0912   LDLCALC 110 (H) 06/12/2019 1049    Other Studies Reviewed Today:  NST 11/07/2017 Study Result  Narrative & Impression   Blood pressure demonstrated a hypertensive response to exercise.  There was no ST segment deviation noted during stress.  Defect 1: There is a medium defect of mild severity present in the mid inferoseptal and apical inferior location. Images are improved on stress when compared to rest (reverse redistribution). Hence, findings appear to be due to soft tissue attenuation. No significant ischemic zones. Regional wall motion is normal.  This is a low risk study.  Nuclear stress EF: 61%.     Echocardiogram 03/12/2014 Study Conclusions   - Procedure narrative: Transthoracic echocardiography. Image  quality was suboptimal. The study was technically difficult, as a  result of poor sound wave transmission.  - Left ventricle: The cavity size was normal. Systolic function was  normal. The estimated ejection fraction was in the range of 60%  to 65%. Wall motion was normal; there were no regional wall  motion abnormalities. Doppler parameters are consistent with  abnormal left ventricular relaxation (grade 1 diastolic  dysfunction). Mild to  moderate concentric LVH.  - Aorta: Mild aortic root dilatation. Aortic root dimension: 44 mm  (ED).   Transthoracic echocardiography. M-mode, complete 2D, spectral  Doppler, and color Doppler. Birthdate: Patient birthdate:  1960-12-18. Age: Patient is 59 yr old. Sex: Gender: male.  BMI: 31 kg/m^2. Blood pressure:   153/108 Patient status:  Outpatient. Study date: Study date: 03/12/2014. Study time: 12:13  PM. Location: Echo laboratory.   -------------------------------------------------------------------   -------------------------------------------------------------------  Left ventricle: The cavity size was normal. Systolic function was  normal. The estimated ejection fraction was in the range of 60% to  65%. Wall motion was normal; there were no regional wall motion  abnormalities. Mild to moderate concentric LVH. Doppler parameters  are consistent with abnormal left ventricular relaxation (grade 1  diastolic dysfunction). There was no evidence of elevated  ventricular filling pressure by Doppler parameters.   -------------------------------------------------------------------  Aortic valve:  Structurally normal valve.  Cusp separation was  normal. Doppler: Transvalvular velocity was within the normal  range. There was no stenosis. There was no regurgitation.  Valve  area (VTI): 3.42 cm^2. Indexed valve area (VTI): 1.44 cm^2/m^2.  Valve area (Vmax): 3.23 cm^2. Indexed valve area (Vmax): 1.36  cm^2/m^2.  Peak gradient (S): 8 mm Hg.   -------------------------------------------------------------------  Aorta: Mild aortic root dilatation.   -------------------------------------------------------------------  Mitral valve:  Structurally normal valve.  Leaflet separation was  normal. Doppler: Transvalvular velocity was within the normal  range. There was no evidence for stenosis. There was no  regurgitation.     Assessment and Plan:  1.  Accelerated hypertension   2. Aortic root dilatation (HCC)   3. Obesity, unspecified classification, unspecified obesity type, unspecified whether serious comorbidity present   4. OSA (obstructive sleep apnea)    1. Accelerated hypertension BP normal at last visit.  Blood pressure 120/68 today.  Patient states his blood pressure has been spiking in the evenings around 5 PM sometimes systolic gets higher than 200 per his statement.  Please stop labetalol.  Start Toprol-XL 12.5 mg daily.  Come back in 2 weeks for blood pressure check.  Monitor your blood pressures at home to check evening blood pressures to see if they have improved.  2. Aortic root dilatation (HCC)  CTA chest 10/09/2017 No evidence of aortic dissection. Slight dilatation of the aortic root at 4.0 cm, stable. Imaging performed d/t CP w/radiation to L. Arm, sob, dizziness, weakness, r/o dissection.  3. Obesity, unspecified classification, unspecified obesity type, unspecified whether serious comorbidity present Weight at last visit 231.  Weight today is 233.  Patient states he is very active with 5 children and 4 grandchildren.  4. OSA (obstructive sleep apnea) Not using CPAP at last visit.  Continues not using CPAP  Medication Adjustments/Labs and Tests Ordered: Current medicines are reviewed at length with the patient today.  Concerns regarding medicines are outlined above.   Disposition: Follow-up with Dr. Harl Bowie or APP 3 months  Signed, Levell July, NP 12/19/2019 10:24 AM    [facility] [place], [place], [place] [address] Phone: [phone]; Fax: [phone]

## 2019-12-19 ENCOUNTER — Ambulatory Visit: Payer: Medicare Other | Admitting: Family Medicine

## 2019-12-19 ENCOUNTER — Encounter: Payer: Self-pay | Admitting: Family Medicine

## 2019-12-19 VITALS — BP 120/68 | HR 72 | Ht 73.0 in | Wt 233.0 lb

## 2019-12-19 DIAGNOSIS — I10 Essential (primary) hypertension: Secondary | ICD-10-CM | POA: Diagnosis not present

## 2019-12-19 DIAGNOSIS — G4733 Obstructive sleep apnea (adult) (pediatric): Secondary | ICD-10-CM | POA: Diagnosis not present

## 2019-12-19 DIAGNOSIS — I77810 Thoracic aortic ectasia: Secondary | ICD-10-CM | POA: Diagnosis not present

## 2019-12-19 DIAGNOSIS — E669 Obesity, unspecified: Secondary | ICD-10-CM

## 2019-12-19 MED ORDER — METOPROLOL SUCCINATE ER 25 MG PO TB24: 12.5000 mg | ORAL_TABLET | Freq: Every day | ORAL | 6 refills | Status: DC

## 2019-12-19 NOTE — Patient Instructions (Signed)
Medication Instructions:   Stop Labetalol (Normodyne).   Begin Toprol XL 12.5mg  daily (1/2 of a 25mg  tablet).  Continue all other medications.    Labwork: none  Testing/Procedures: none  Follow-Up: 3 months   Any Other Special Instructions Will Be Listed Below (If Applicable). Nurse visit for blood pressure check in 2 weeks.  If you need a refill on your cardiac medications before your next appointment, please call your pharmacy.

## 2020-01-02 ENCOUNTER — Ambulatory Visit (INDEPENDENT_AMBULATORY_CARE_PROVIDER_SITE_OTHER): Payer: Medicare Other | Admitting: *Deleted

## 2020-01-02 VITALS — BP 150/78 | HR 86 | Ht 73.0 in | Wt 233.8 lb

## 2020-01-02 DIAGNOSIS — I10 Essential (primary) hypertension: Secondary | ICD-10-CM

## 2020-01-02 NOTE — Progress Notes (Signed)
Presents to office for nurse BP check after recent medication changes at last office visit by Katina Dung NP. Recently stopped labetalol and started Toprol-XL 12.5 mg daily.  Has been monitoring home blood pressure readings and will send results to mychart for review. Has taken all doses of medications as prescribed without side effect. Denies dizziness, chest pain or sob. Medications reviewed and vitals taken.

## 2020-01-02 NOTE — Patient Instructions (Signed)
You will be contacted with any changes Continue same plan for now

## 2020-01-06 ENCOUNTER — Telehealth: Payer: Self-pay | Admitting: *Deleted

## 2020-01-06 MED ORDER — METOPROLOL SUCCINATE ER 25 MG PO TB24: 25.0000 mg | ORAL_TABLET | Freq: Every day | ORAL | 6 refills | Status: DC

## 2020-01-06 NOTE — Telephone Encounter (Signed)
Per Katina Dung, NP: Just have him increase the Toprol XL to 25 mg once per day and see how he does. Thanks. Have him send some more BP readings in 2 weeks and we can adjust as needed.

## 2020-01-06 NOTE — Telephone Encounter (Signed)
Please order a new prescription for Toprol XL 25 mg p.o. twice daily for patient due to continued elevated blood pressure.  Thank you

## 2020-01-06 NOTE — Telephone Encounter (Signed)
Pt voiced understanding and will send BP readings in 2 weeks

## 2020-01-10 ENCOUNTER — Other Ambulatory Visit: Payer: Self-pay | Admitting: Cardiology

## 2020-01-11 ENCOUNTER — Other Ambulatory Visit: Payer: Self-pay | Admitting: Cardiology

## 2020-01-20 ENCOUNTER — Other Ambulatory Visit: Payer: Self-pay | Admitting: *Deleted

## 2020-01-20 MED ORDER — HYDRALAZINE HCL 100 MG PO TABS: 100.0000 mg | ORAL_TABLET | Freq: Three times a day (TID) | ORAL | 1 refills | Status: DC

## 2020-01-20 NOTE — Telephone Encounter (Signed)
Increase the dose to 100 mg 3 times a day of hydralazine.  Have the patient continue to monitor his blood pressure and send [place] another set of blood pressure results in 2 weeks after he increases the dose.  Thank you

## 2020-03-23 NOTE — Progress Notes (Deleted)
Cardiology Office Note  Date: 03/23/2020   ID: Mayra, Brahm February 23, 1961, MRN [id number]  PCP:  Susy Frizzle, MD  Cardiologist:  No primary care provider on file. Electrophysiologist:  None   Chief Complaint: Accelerated hypertension  History of Present Illness: George Pratt is a 59 y.o. male with a history of accelerated hypertension.  OSA not using CPAP.  Previous nuclear stress test 11/07/2017.  No ischemia, low risk, EF 61%.  Last seen by Dr. Bronson Ing 08/02/2018.  He denied any symptoms of chest pain, palpitations, shortness of breath, lightheadedness, dizziness, lower extremity edema, orthopnea, PND, or syncope.  Blood pressure was normal.  He was continuing his amlodipine, hydralazine, labetalol, lisinopril.  Chest CT from August 2017 showed aortic root dilatation of 4.2 cm in diameter.  He preferred to avoid further testing.  He was up to 231 pounds.  Exercise for weight control was discussed.  He was not using his CPAP for OSA.   Patient states overall he has been doing well.  He states he is having issues with labile blood pressures.  He states he believes, for his medications are ceasing to be effective due to significantly elevated blood pressures in the evening starting around 5 PM.  He denies any other anginal or exertional symptoms, palpitations or arrhythmias, orthostatic symptoms, CVA or TIA-like symptoms, PND, orthopnea, lower extremity edema.   Past Medical History:  Diagnosis Date  . Hypertension   . Sleep apnea     Past Surgical History:  Procedure Laterality Date  . APPENDECTOMY  2006  . COLONOSCOPY N/A 10/01/2015   Procedure: COLONOSCOPY;  Surgeon: Rogene Houston, MD;  Location: [facility];  Service: Endoscopy;  Laterality: N/A;  1030  . KNEE SURGERY    . NECK SURGERY  2000    Current Outpatient Medications  Medication Sig Dispense Refill  . amLODipine (NORVASC) 10 MG tablet TAKE 1 TABLET BY MOUTH ONCE DAILY 90 tablet 3  . aspirin EC 81 MG  tablet Take 1 tablet (81 mg total) by mouth daily.    Marland Kitchen atorvastatin (LIPITOR) 40 MG tablet TAKE 1 TABLET BY MOUTH DAILY 90 tablet 1  . hydrALAZINE (APRESOLINE) 100 MG tablet Take 1 tablet (100 mg total) by mouth 3 (three) times daily. 270 tablet 1  . LINZESS 72 MCG capsule TAKE 1 CAPSULE BY MOUTH  DAILY BEFORE BREAKFAST 90 capsule 0  . lisinopril (ZESTRIL) 40 MG tablet TAKE 1 TABLET BY MOUTH ONCE DAILY 90 tablet 3  . metoprolol succinate (TOPROL XL) 25 MG 24 hr tablet Take 1 tablet (25 mg total) by mouth daily. 30 tablet 6  . Multiple Vitamins-Minerals (ONE-A-DAY MENS 50+ ADVANTAGE PO) Take 1 tablet by mouth daily.    . sertraline (ZOLOFT) 50 MG tablet Take 1 tablet (50 mg total) by mouth daily. 90 tablet 0  . traMADol (ULTRAM) 50 MG tablet Take 1 tablet (50 mg total) by mouth every 6 (six) hours as needed. 15 tablet 0   No current facility-administered medications for this visit.   Allergies:  Bee venom   Social History: The patient  reports that he has never smoked. He has never used smokeless tobacco. He reports that he does not drink alcohol and does not use drugs.   Family History: The patient's family history includes Diabetes in his father; Heart disease in his mother; Heart failure in his mother; Hypertension in his brother, brother, brother, father, and mother; Vision loss in his father.   ROS:  Please see the history of present illness. Otherwise, complete review of systems is positive for none.  All other systems are reviewed and negative.   Physical Exam: VS:  There were no vitals taken for this visit., BMI There is no height or weight on file to calculate BMI.  Wt Readings from Last 3 Encounters:  01/02/20 233 lb 12.8 oz (106.1 kg)  12/19/19 233 lb (105.7 kg)  08/02/18 231 lb (104.8 kg)    General: Patient appears comfortable at rest. Neck: Supple, no elevated JVP or carotid bruits, no thyromegaly. Lungs: Clear to auscultation, nonlabored breathing at rest. Cardiac:  Regular rate and rhythm, no S3 or significant systolic murmur, no pericardial rub. Extremities: No pitting edema, distal pulses 2+. Skin: Warm and dry. Musculoskeletal: No kyphosis. Neuropsychiatric: Alert and oriented x3, affect grossly appropriate.  ECG:  An ECG dated 12/19/2019 was personally reviewed today and demonstrated:  Normal sinus rhythm rate of 75, incomplete right bundle branch block.  Recent Labwork: 06/12/2019: ALT 16; AST 13; BUN 16; Creat 1.27; Hemoglobin 14.8; Platelets 283; Potassium 3.8; Sodium 142     Component Value Date/Time   CHOL 165 06/12/2019 1049   TRIG 86 06/12/2019 1049   HDL 37 (L) 06/12/2019 1049   CHOLHDL 4.5 06/12/2019 1049   VLDL 13 03/03/2016 0912   LDLCALC 110 (H) 06/12/2019 1049    Other Studies Reviewed Today:  NST 11/07/2017 Study Result  Narrative & Impression   Blood pressure demonstrated a hypertensive response to exercise.  There was no ST segment deviation noted during stress.  Defect 1: There is a medium defect of mild severity present in the mid inferoseptal and apical inferior location. Images are improved on stress when compared to rest (reverse redistribution). Hence, findings appear to be due to soft tissue attenuation. No significant ischemic zones. Regional wall motion is normal.  This is a low risk study.  Nuclear stress EF: 61%.     Echocardiogram 03/12/2014 Study Conclusions   - Procedure narrative: Transthoracic echocardiography. Image  quality was suboptimal. The study was technically difficult, as a  result of poor sound wave transmission.  - Left ventricle: The cavity size was normal. Systolic function was  normal. The estimated ejection fraction was in the range of 60%  to 65%. Wall motion was normal; there were no regional wall  motion abnormalities. Doppler parameters are consistent with  abnormal left ventricular relaxation (grade 1 diastolic  dysfunction). Mild to moderate concentric LVH.  -  Aorta: Mild aortic root dilatation. Aortic root dimension: 44 mm  (ED).   Transthoracic echocardiography. M-mode, complete 2D, spectral  Doppler, and color Doppler. Birthdate: Patient birthdate:  Jul 18, 1960. Age: Patient is 59 yr old. Sex: Gender: male.  BMI: 31 kg/m^2. Blood pressure:   153/108 Patient status:  Outpatient. Study date: Study date: 03/12/2014. Study time: 12:13  PM. Location: Echo laboratory.   -------------------------------------------------------------------   -------------------------------------------------------------------  Left ventricle: The cavity size was normal. Systolic function was  normal. The estimated ejection fraction was in the range of 60% to  65%. Wall motion was normal; there were no regional wall motion  abnormalities. Mild to moderate concentric LVH. Doppler parameters  are consistent with abnormal left ventricular relaxation (grade 1  diastolic dysfunction). There was no evidence of elevated  ventricular filling pressure by Doppler parameters.   -------------------------------------------------------------------  Aortic valve:  Structurally normal valve.  Cusp separation was  normal. Doppler: Transvalvular velocity was within the normal  range. There was no stenosis. There was no regurgitation.  Valve  area (VTI): 3.42 cm^2. Indexed valve area (VTI): 1.44 cm^2/m^2.  Valve area (Vmax): 3.23 cm^2. Indexed valve area (Vmax): 1.36  cm^2/m^2.  Peak gradient (S): 8 mm Hg.   -------------------------------------------------------------------  Aorta: Mild aortic root dilatation.   -------------------------------------------------------------------  Mitral valve:  Structurally normal valve.  Leaflet separation was  normal. Doppler: Transvalvular velocity was within the normal  range. There was no evidence for stenosis. There was no  regurgitation.     Assessment and Plan:  No diagnosis found. 1. Accelerated  hypertension BP normal at last visit.  Blood pressure 120/68 today.  Patient states his blood pressure has been spiking in the evenings around 5 PM sometimes systolic gets higher than 200 per his statement.  Please stop labetalol.  Start Toprol-XL 12.5 mg daily.  Come back in 2 weeks for blood pressure check.  Monitor your blood pressures at home to check evening blood pressures to see if they have improved.  2. Aortic root dilatation (HCC)  CTA chest 10/09/2017 No evidence of aortic dissection. Slight dilatation of the aortic root at 4.0 cm, stable. Imaging performed d/t CP w/radiation to L. Arm, sob, dizziness, weakness, r/o dissection.  3. Obesity, unspecified classification, unspecified obesity type, unspecified whether serious comorbidity present Weight at last visit 231.  Weight today is 233.  Patient states he is very active with 5 children and 4 grandchildren.  4. OSA (obstructive sleep apnea) Not using CPAP at last visit.  Continues not using CPAP  Medication Adjustments/Labs and Tests Ordered: Current medicines are reviewed at length with the patient today.  Concerns regarding medicines are outlined above.   Disposition: Follow-up with Dr. Harl Bowie or APP 3 months  Signed, Levell July, NP 03/23/2020 1:12 PM    [facility] at [place], [place], [place] [address] Phone: [phone]; Fax: [phone]

## 2020-03-24 ENCOUNTER — Ambulatory Visit: Payer: Medicare Other | Admitting: Family Medicine

## 2020-03-24 DIAGNOSIS — I129 Hypertensive chronic kidney disease with stage 1 through stage 4 chronic kidney disease, or unspecified chronic kidney disease: Secondary | ICD-10-CM | POA: Diagnosis not present

## 2020-03-24 DIAGNOSIS — R739 Hyperglycemia, unspecified: Secondary | ICD-10-CM | POA: Diagnosis not present

## 2020-03-24 DIAGNOSIS — J9601 Acute respiratory failure with hypoxia: Secondary | ICD-10-CM | POA: Diagnosis not present

## 2020-03-24 DIAGNOSIS — E871 Hypo-osmolality and hyponatremia: Secondary | ICD-10-CM | POA: Diagnosis not present

## 2020-03-24 DIAGNOSIS — G4733 Obstructive sleep apnea (adult) (pediatric): Secondary | ICD-10-CM | POA: Diagnosis not present

## 2020-03-24 DIAGNOSIS — R0902 Hypoxemia: Secondary | ICD-10-CM | POA: Diagnosis not present

## 2020-03-24 DIAGNOSIS — Z23 Encounter for immunization: Secondary | ICD-10-CM | POA: Diagnosis not present

## 2020-03-24 DIAGNOSIS — R0602 Shortness of breath: Secondary | ICD-10-CM | POA: Diagnosis not present

## 2020-03-24 DIAGNOSIS — U071 COVID-19: Secondary | ICD-10-CM | POA: Diagnosis not present

## 2020-03-24 DIAGNOSIS — R069 Unspecified abnormalities of breathing: Secondary | ICD-10-CM | POA: Diagnosis not present

## 2020-03-24 DIAGNOSIS — Z7982 Long term (current) use of aspirin: Secondary | ICD-10-CM | POA: Diagnosis not present

## 2020-03-24 DIAGNOSIS — I10 Essential (primary) hypertension: Secondary | ICD-10-CM | POA: Diagnosis not present

## 2020-03-24 DIAGNOSIS — J1282 Pneumonia due to coronavirus disease 2019: Secondary | ICD-10-CM | POA: Diagnosis not present

## 2020-03-24 DIAGNOSIS — R0689 Other abnormalities of breathing: Secondary | ICD-10-CM | POA: Diagnosis not present

## 2020-03-24 DIAGNOSIS — N181 Chronic kidney disease, stage 1: Secondary | ICD-10-CM | POA: Diagnosis not present

## 2020-03-24 DIAGNOSIS — I714 Abdominal aortic aneurysm, without rupture: Secondary | ICD-10-CM | POA: Diagnosis not present

## 2020-03-25 DIAGNOSIS — U071 COVID-19: Secondary | ICD-10-CM | POA: Insufficient documentation

## 2020-03-25 DIAGNOSIS — J1282 Pneumonia due to coronavirus disease 2019: Secondary | ICD-10-CM | POA: Insufficient documentation

## 2020-03-31 ENCOUNTER — Other Ambulatory Visit: Payer: Self-pay

## 2020-03-31 ENCOUNTER — Ambulatory Visit (INDEPENDENT_AMBULATORY_CARE_PROVIDER_SITE_OTHER): Payer: Medicare Other | Admitting: Family Medicine

## 2020-03-31 DIAGNOSIS — Z23 Encounter for immunization: Secondary | ICD-10-CM | POA: Diagnosis not present

## 2020-04-14 ENCOUNTER — Ambulatory Visit (INDEPENDENT_AMBULATORY_CARE_PROVIDER_SITE_OTHER): Payer: Medicare Other | Admitting: Family Medicine

## 2020-04-14 ENCOUNTER — Other Ambulatory Visit: Payer: Self-pay

## 2020-04-14 VITALS — BP 160/90 | HR 97 | Temp 97.5°F | Ht 73.0 in | Wt 230.0 lb

## 2020-04-14 DIAGNOSIS — U071 COVID-19: Secondary | ICD-10-CM | POA: Diagnosis not present

## 2020-04-14 NOTE — Progress Notes (Signed)
Subjective:    Patient ID: George Pratt, male    DOB: 1961-03-07, 59 y.o.   MRN: [id number]  HPI Patient is a very pleasant 59 year old Caucasian male who was admitted to the hospital at [facility] on October 27.  At the hospital at that time, his pulse oximetry was 67%.  He was started immediately on BiPAP, remdesivir, Decadron.  Later on, baricitinib was added.  The patient was gradually transitioned to nasal cannula oxygen and was discharged from the hospital on November 2 not requiring any oxygen.  He completed Decadron 5 days after leaving the hospital.  However he is still on Eliquis.  He was receiving Lovenox while in the hospital for DVT prophylaxis due to an elevated D-dimer.  They have recommended that he continue the Eliquis for 1 month post discharge however he has no evidence of a DVT or pulmonary embolism per his report.  Therefore this would strictly be for DVT prophylaxis in the setting of a hypercoagulable state such as Covid.  We discussed the pros and cons of this and the patient feels comfortable with taking this prophylactically as he does not believe that he will be strong enough to recover from a pulmonary embolism should he develop 1 over the next few weeks. Past Medical History:  Diagnosis Date  . Hypertension   . Sleep apnea    Past Surgical History:  Procedure Laterality Date  . APPENDECTOMY  2006  . COLONOSCOPY N/A 10/01/2015   Procedure: COLONOSCOPY;  Surgeon: Rogene Houston, MD;  [facility];  Service: Endoscopy;  Laterality: N/A;  1030  . KNEE SURGERY    . NECK SURGERY  2000   Current Outpatient Medications on File Prior to Visit  Medication Sig Dispense Refill  . amLODipine (NORVASC) 10 MG tablet TAKE 1 TABLET BY MOUTH ONCE DAILY 90 tablet 3  . aspirin EC 81 MG tablet Take 1 tablet (81 mg total) by mouth daily.    Marland Kitchen atorvastatin (LIPITOR) 40 MG tablet TAKE 1 TABLET BY MOUTH DAILY 90 tablet 1  . hydrALAZINE (APRESOLINE) 100 MG tablet Take 1  tablet (100 mg total) by mouth 3 (three) times daily. 270 tablet 1  . LINZESS 72 MCG capsule TAKE 1 CAPSULE BY MOUTH  DAILY BEFORE BREAKFAST 90 capsule 0  . lisinopril (ZESTRIL) 40 MG tablet TAKE 1 TABLET BY MOUTH ONCE DAILY 90 tablet 3  . metoprolol succinate (TOPROL XL) 25 MG 24 hr tablet Take 1 tablet (25 mg total) by mouth daily. 30 tablet 6  . Multiple Vitamins-Minerals (ONE-A-DAY MENS 50+ ADVANTAGE PO) Take 1 tablet by mouth daily.    . sertraline (ZOLOFT) 50 MG tablet Take 1 tablet (50 mg total) by mouth daily. 90 tablet 0  . traMADol (ULTRAM) 50 MG tablet Take 1 tablet (50 mg total) by mouth every 6 (six) hours as needed. 15 tablet 0   No current facility-administered medications on file prior to visit.   Allergies  Allergen Reactions  . Bee Venom Swelling   Social History   Socioeconomic History  . Marital status: Married    Spouse name: Not on file  . Number of children: Not on file  . Years of education: Not on file  . Highest education level: Not on file  Occupational History  . Not on file  Tobacco Use  . Smoking status: Never Smoker  . Smokeless tobacco: Never Used  Vaping Use  . Vaping Use: Never used  Substance and Sexual Activity  .  Alcohol use: No    Alcohol/week: 0.0 standard drinks  . Drug use: No  . Sexual activity: Yes  Other Topics Concern  . Not on file  Social History Narrative   Married.    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    --Is available for students to call him, email him, etc   Says it is for a group of multiple colleges--in [place], etc.   Social Determinants of Health   Financial Resource Strain:   . Difficulty of Paying Living Expenses: Not on file  Food Insecurity:   . Worried About Charity fundraiser in the Last Year: Not on file  . Ran Out of Food in the Last Year: Not on file  Transportation Needs:   . Lack of Transportation (Medical): Not on file  . Lack of Transportation  (Non-Medical): Not on file  Physical Activity:   . Days of Exercise per Week: Not on file  . Minutes of Exercise per Session: Not on file  Stress:   . Feeling of Stress : Not on file  Social Connections:   . Frequency of Communication with Friends and Family: Not on file  . Frequency of Social Gatherings with Friends and Family: Not on file  . Attends Religious Services: Not on file  . Active Member of Clubs or Organizations: Not on file  . Attends Archivist Meetings: Not on file  . Marital Status: Not on file  Intimate Partner Violence:   . Fear of Current or Ex-Partner: Not on file  . Emotionally Abused: Not on file  . Physically Abused: Not on file  . Sexually Abused: Not on file      Review of Systems  All other systems reviewed and are negative.      Objective:   Physical Exam Vitals reviewed.  Constitutional:      General: He is not in acute distress.    Appearance: Normal appearance. He is normal weight. He is not ill-appearing or toxic-appearing.  Cardiovascular:     Rate and Rhythm: Normal rate and regular rhythm.     Heart sounds: Normal heart sounds. No murmur heard.  No gallop.   Pulmonary:     Effort: Pulmonary effort is normal. No respiratory distress.     Breath sounds: Normal breath sounds. No wheezing, rhonchi or rales.  Abdominal:     General: Abdomen is flat. Bowel sounds are normal.     Palpations: Abdomen is soft.  Musculoskeletal:     Right lower leg: No edema.     Left lower leg: No edema.  Neurological:     Mental Status: He is alert.           Assessment & Plan:  COVID-19 - Plan: CBC with Differential/Platelet, COMPLETE METABOLIC PANEL WITH GFR  Lungs sound remarkable given what he has been through.  His breath sounds are clear and there is no crackles or rails appreciated on exam.  His pulse oximetry is adequate on room air alone.  However he continues to be short of breath with minimal activity.  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  We will continue Eliquis for 1 month post discharge.  I anticipate that the patient will be able to return to work approximately within the first 4 weeks  therefore I will set a tentative date of December 15.  However this will be determined as the patient progresses moving forward.  I will check a CBC and a CMP today and then reassess the patient in 1 month or sooner if worsening

## 2020-04-15 LAB — CBC WITH DIFFERENTIAL/PLATELET
Absolute Monocytes: 569 cells/uL (ref 200–950)
Basophils Absolute: 24 cells/uL (ref 0–200)
Basophils Relative: 0.3 %
Eosinophils Absolute: 87 cells/uL (ref 15–500)
Eosinophils Relative: 1.1 %
HCT: 44.2 % (ref 38.5–50.0)
Hemoglobin: 14.8 g/dL (ref 13.2–17.1)
Lymphs Abs: 1177 cells/uL (ref 850–3900)
MCH: 29.4 pg (ref 27.0–33.0)
MCHC: 33.5 g/dL (ref 32.0–36.0)
MCV: 87.9 fL (ref 80.0–100.0)
MPV: 9.8 fL (ref 7.5–12.5)
Monocytes Relative: 7.2 %
Neutro Abs: 6044 cells/uL (ref 1500–7800)
Neutrophils Relative %: 76.5 %
Platelets: 163 10*3/uL (ref 140–400)
RBC: 5.03 10*6/uL (ref 4.20–5.80)
RDW: 13.2 % (ref 11.0–15.0)
Total Lymphocyte: 14.9 %
WBC: 7.9 10*3/uL (ref 3.8–10.8)

## 2020-04-15 LAB — COMPLETE METABOLIC PANEL WITH GFR
AG Ratio: 1.8 (calc) (ref 1.0–2.5)
ALT: 19 U/L (ref 9–46)
AST: 14 U/L (ref 10–35)
Albumin: 4.1 g/dL (ref 3.6–5.1)
Alkaline phosphatase (APISO): 95 U/L (ref 35–144)
BUN: 18 mg/dL (ref 7–25)
CO2: 24 mmol/L (ref 20–32)
Calcium: 8.9 mg/dL (ref 8.6–10.3)
Chloride: 107 mmol/L (ref 98–110)
Creat: 1.1 mg/dL (ref 0.70–1.25)
GFR, Est African American: 84 mL/min/{1.73_m2} (ref >=60)
GFR, Est Non African American: 72 mL/min/{1.73_m2} (ref >=60)
Globulin: 2.3 g/dL (calc) (ref 1.9–3.7)
Glucose, Bld: 105 mg/dL — ABNORMAL HIGH (ref 65–99)
Potassium: 4.1 mmol/L (ref 3.5–5.3)
Sodium: 144 mmol/L (ref 135–146)
Total Bilirubin: 0.5 mg/dL (ref 0.2–1.2)
Total Protein: 6.4 g/dL (ref 6.1–8.1)

## 2020-06-29 NOTE — Progress Notes (Unsigned)
Cardiology Office Note  Date: 06/30/2020   ID: Phong, Isenberg 29-Nov-1960, MRN [id number]  PCP:  Susy Frizzle, MD  Cardiologist:  No primary care provider on file. Electrophysiologist:  None   Chief Complaint: Accelerated hypertension  History of Present Illness: George Pratt is a 60 y.o. male with a history of accelerated hypertension.  OSA not using CPAP.  Previous nuclear stress test 11/07/2017.  No ischemia, low risk, EF 61%.  Last seen by Dr. Bronson Ing 08/02/2018.  He denied any symptoms of chest pain, palpitations, shortness of breath, lightheadedness, dizziness, lower extremity edema, orthopnea, PND, or syncope.  Blood pressure was normal.  He was continuing his amlodipine, hydralazine, labetalol, lisinopril.  Chest CT from August 2017 showed aortic root dilatation of 4.2 cm in diameter.  He preferred to avoid further testing.  He was up to 231 pounds.  Exercise for weight control was discussed.  He was not using his CPAP for OSA.   Last visit patient had  overall been doing well.  He stated he was having issues with labile blood pressures.  He states he believes, for his medications are ceasing to be effective due to significantly elevated blood pressures in the evening starting around 5 PM.  He denies any other anginal or exertional symptoms, palpitations or arrhythmias, orthostatic symptoms, CVA or TIA-like symptoms, PND, orthopnea, lower extremity edema.  He is here for follow-up today. States she was recently in [facility] for a week secondary to Covid pneumonia. States she is feeling better now but still feels a little weak. Denies any other sequelae from the virus. Blood pressure is up a little today but states he only took his blood pressure medications about an hour ago. He states during his hospital stay systolic blood pressures were in the 120s to 130s. He denies any current anginal or exertional symptoms, palpitations or arrhythmias, orthostatic symptoms,  CVA or TIA-like symptoms, PND, orthopnea, bleeding, claudication-like symptoms, DVT or PE symptoms or lower extremity edema.  Past Medical History:  Diagnosis Date  . Hypertension   . Sleep apnea     Past Surgical History:  Procedure Laterality Date  . APPENDECTOMY  2006  . COLONOSCOPY N/A 10/01/2015   Procedure: COLONOSCOPY;  Surgeon: Rogene Houston, MD;  [facility];  Service: Endoscopy;  Laterality: N/A;  1030  . KNEE SURGERY    . NECK SURGERY  2000    Current Outpatient Medications  Medication Sig Dispense Refill  . amLODipine (NORVASC) 10 MG tablet TAKE 1 TABLET BY MOUTH ONCE DAILY 90 tablet 3  . aspirin EC 81 MG tablet Take 1 tablet (81 mg total) by mouth daily.    Marland Kitchen atorvastatin (LIPITOR) 40 MG tablet TAKE 1 TABLET BY MOUTH DAILY 90 tablet 1  . hydrALAZINE (APRESOLINE) 100 MG tablet Take 1 tablet (100 mg total) by mouth 3 (three) times daily. 270 tablet 1  . LINZESS 72 MCG capsule TAKE 1 CAPSULE BY MOUTH  DAILY BEFORE BREAKFAST 90 capsule 0  . lisinopril (ZESTRIL) 40 MG tablet TAKE 1 TABLET BY MOUTH ONCE DAILY 90 tablet 3  . metoprolol succinate (TOPROL XL) 25 MG 24 hr tablet Take 1 tablet (25 mg total) by mouth daily. 30 tablet 6  . Multiple Vitamins-Minerals (ONE-A-DAY MENS 50+ ADVANTAGE PO) Take 1 tablet by mouth daily.    . sertraline (ZOLOFT) 50 MG tablet Take 1 tablet (50 mg total) by mouth daily. 90 tablet 0  . traMADol (ULTRAM) 50 MG  tablet Take 1 tablet (50 mg total) by mouth every 6 (six) hours as needed. 15 tablet 0   No current facility-administered medications for this visit.   Allergies:  Bee venom   Social History: The patient  reports that he has never smoked. He has never used smokeless tobacco. He reports that he does not drink alcohol and does not use drugs.   Family History: The patient's family history includes Diabetes in his father; Heart disease in his mother; Heart failure in his mother; Hypertension in his brother, brother, brother,  father, and mother; Vision loss in his father.   ROS:  Please see the history of present illness. Otherwise, complete review of systems is positive for none.  All other systems are reviewed and negative.   Physical Exam: VS:  BP (!) 150/80   Pulse 84   Ht 6\' 1"  (1.854 m)   Wt 243 lb (110.2 kg)   SpO2 98%   BMI 32.06 kg/m , BMI Body mass index is 32.06 kg/m.  Wt Readings from Last 3 Encounters:  06/30/20 243 lb (110.2 kg)  04/14/20 230 lb (104.3 kg)  01/02/20 233 lb 12.8 oz (106.1 kg)    General: Patient appears comfortable at rest. Neck: Supple, no elevated JVP or carotid bruits, no thyromegaly. Lungs: Clear to auscultation, nonlabored breathing at rest. Cardiac: Regular rate and rhythm, no S3 or significant systolic murmur, no pericardial rub. Extremities: No pitting edema, distal pulses 2+. Skin: Warm and dry. Musculoskeletal: No kyphosis. Neuropsychiatric: Alert and oriented x3, affect grossly appropriate.  ECG:  An ECG dated 12/19/2019 was personally reviewed today and demonstrated:  Normal sinus rhythm rate of 75, incomplete right bundle branch block.  Recent Labwork: 04/14/2020: ALT 19; AST 14; BUN 18; Creat 1.10; Hemoglobin 14.8; Platelets 163; Potassium 4.1; Sodium 144     Component Value Date/Time   CHOL 165 06/12/2019 1049   TRIG 86 06/12/2019 1049   HDL 37 (L) 06/12/2019 1049   CHOLHDL 4.5 06/12/2019 1049   VLDL 13 03/03/2016 0912   LDLCALC 110 (H) 06/12/2019 1049    Other Studies Reviewed Today:  NST 11/07/2017 Study Result  Narrative & Impression   Blood pressure demonstrated a hypertensive response to exercise.  There was no ST segment deviation noted during stress.  Defect 1: There is a medium defect of mild severity present in the mid inferoseptal and apical inferior location. Images are improved on stress when compared to rest (reverse redistribution). Hence, findings appear to be due to soft tissue attenuation. No significant ischemic zones.  Regional wall motion is normal.  This is a low risk study.  Nuclear stress EF: 61%.     Echocardiogram 03/12/2014 Study Conclusions   - Procedure narrative: Transthoracic echocardiography. Image  quality was suboptimal. The study was technically difficult, as a  result of poor sound wave transmission.  - Left ventricle: The cavity size was normal. Systolic function was  normal. The estimated ejection fraction was in the range of 60%  to 65%. Wall motion was normal; there were no regional wall  motion abnormalities. Doppler parameters are consistent with  abnormal left ventricular relaxation (grade 1 diastolic  dysfunction). Mild to moderate concentric LVH.  - Aorta: Mild aortic root dilatation. Aortic root dimension: 44 mm  (ED).   Transthoracic echocardiography. M-mode, complete 2D, spectral  Doppler, and color Doppler. Birthdate: Patient birthdate:  1960/09/14. Age: Patient is 60 yr old. Sex: Gender: male.  BMI: 31 kg/m^2. Blood pressure:   153/108 Patient status:  Outpatient. Study date: Study date: 03/12/2014. Study time: 12:13  PM. Location: Echo laboratory.   -------------------------------------------------------------------   -------------------------------------------------------------------  Left ventricle: The cavity size was normal. Systolic function was  normal. The estimated ejection fraction was in the range of 60% to  65%. Wall motion was normal; there were no regional wall motion  abnormalities. Mild to moderate concentric LVH. Doppler parameters  are consistent with abnormal left ventricular relaxation (grade 1  diastolic dysfunction). There was no evidence of elevated  ventricular filling pressure by Doppler parameters.   -------------------------------------------------------------------  Aortic valve:  Structurally normal valve.  Cusp separation was  normal. Doppler: Transvalvular velocity was within the normal   range. There was no stenosis. There was no regurgitation.  Valve  area (VTI): 3.42 cm^2. Indexed valve area (VTI): 1.44 cm^2/m^2.  Valve area (Vmax): 3.23 cm^2. Indexed valve area (Vmax): 1.36  cm^2/m^2.  Peak gradient (S): 8 mm Hg.   -------------------------------------------------------------------  Aorta: Mild aortic root dilatation.   -------------------------------------------------------------------  Mitral valve:  Structurally normal valve.  Leaflet separation was  normal. Doppler: Transvalvular velocity was within the normal  range. There was no evidence for stenosis. There was no  regurgitation.     Assessment and Plan:   1. Accelerated hypertension Blood pressure today slightly elevated at 150/80. Patient states she only took his antihypertensive medications about an hour ago. He states he was recently in the hospital secondary to Covid pneumonia and systolic blood pressures were in the 120s to 130s. Continue Toprol-XL 12.5 mg daily.   On January 20, 2020 patient's hydralazine was increased to 100 mg 3 times daily. Continue hydralazine 100 mg 3 times daily. Continue lisinopril 40 mg daily.  2. Aortic root dilatation (HCC)  CTA chest 10/09/2017 No evidence of aortic dissection. Slight dilatation of the aortic root at 4.0 cm, stable. Imaging performed d/t CP w/radiation to L. Arm, sob, dizziness, weakness, r/o dissection.  3. Obesity, unspecified classification, unspecified obesity type, unspecified whether serious comorbidity present Patient states he has gained some weight during this winter due to not being very active and needs to increase activity to lose weight. Encouraged weight loss and increased activity. Patient states he is very active with 5 children and 4 grandchildren.  4. OSA (obstructive sleep apnea) Not using CPAP at last visit.  Continues not using CPAP  Medication Adjustments/Labs and Tests Ordered: Current medicines are reviewed at length with the  patient today.  Concerns regarding medicines are outlined above.   Disposition: Follow-up with Dr. Harl Bowie or [facility] 6 months  Signed, Levell July, NP 06/30/2020 9:45 AM    [facility] [place], [place], [place] [address] Phone: [phone]; Fax: [phone]

## 2020-06-30 ENCOUNTER — Encounter: Payer: Self-pay | Admitting: Family Medicine

## 2020-06-30 ENCOUNTER — Ambulatory Visit (INDEPENDENT_AMBULATORY_CARE_PROVIDER_SITE_OTHER): Payer: Medicare Other | Admitting: Family Medicine

## 2020-06-30 VITALS — BP 150/80 | HR 84 | Ht 73.0 in | Wt 243.0 lb

## 2020-06-30 DIAGNOSIS — G4733 Obstructive sleep apnea (adult) (pediatric): Secondary | ICD-10-CM

## 2020-06-30 DIAGNOSIS — I77810 Thoracic aortic ectasia: Secondary | ICD-10-CM

## 2020-06-30 DIAGNOSIS — E669 Obesity, unspecified: Secondary | ICD-10-CM

## 2020-06-30 DIAGNOSIS — I10 Essential (primary) hypertension: Secondary | ICD-10-CM | POA: Diagnosis not present

## 2020-06-30 NOTE — Patient Instructions (Signed)
Medication Instructions:  Continue all current medications.   Labwork: none  Testing/Procedures: none  Follow-Up: 6 months   Any Other Special Instructions Will Be Listed Below (If Applicable).   If you need a refill on your cardiac medications before your next appointment, please call your pharmacy.  

## 2020-07-03 ENCOUNTER — Other Ambulatory Visit: Payer: Self-pay | Admitting: *Deleted

## 2020-07-03 MED ORDER — HYDRALAZINE HCL 100 MG PO TABS
100.0000 mg | ORAL_TABLET | Freq: Three times a day (TID) | ORAL | 1 refills | Status: DC
Start: 2020-07-03 — End: 2021-02-16

## 2020-08-20 ENCOUNTER — Encounter: Payer: Self-pay | Admitting: Family Medicine

## 2020-08-20 MED ORDER — ATORVASTATIN CALCIUM 40 MG PO TABS
40.0000 mg | ORAL_TABLET | Freq: Every day | ORAL | 1 refills | Status: DC
Start: 2020-08-20 — End: 2021-02-16

## 2020-08-28 ENCOUNTER — Encounter: Payer: Self-pay | Admitting: Family Medicine

## 2020-09-04 ENCOUNTER — Other Ambulatory Visit: Payer: Self-pay

## 2020-09-04 ENCOUNTER — Encounter: Payer: Self-pay | Admitting: Family Medicine

## 2020-09-04 ENCOUNTER — Ambulatory Visit (INDEPENDENT_AMBULATORY_CARE_PROVIDER_SITE_OTHER): Payer: Medicare Other | Admitting: Family Medicine

## 2020-09-04 VITALS — BP 136/80 | HR 70 | Temp 98.9°F | Resp 16 | Ht 73.0 in | Wt 243.0 lb

## 2020-09-04 DIAGNOSIS — L918 Other hypertrophic disorders of the skin: Secondary | ICD-10-CM | POA: Diagnosis not present

## 2020-09-04 MED ORDER — TOPIRAMATE 25 MG PO TABS: 50.0000 mg | ORAL_TABLET | Freq: Two times a day (BID) | ORAL | 3 refills | Status: DC

## 2020-09-04 NOTE — Progress Notes (Signed)
Subjective:    Patient ID: George Pratt, male    DOB: [birth year], 60 y.o.   MRN: [id number]  HPI Patient is requesting skin tag removal.  He has one lesion under his nose that he states bleeds easily every time he nicks it.  He is tried cutting it but has been unsuccessful.  He also has a skin tag on his posterior right shoulder   Past Medical History:  Diagnosis Date  . Hypertension   . Sleep apnea    Past Surgical History:  Procedure Laterality Date  . APPENDECTOMY  2006  . COLONOSCOPY N/A 10/01/2015   Procedure: COLONOSCOPY;  Surgeon: Rogene Houston, MD;  [facility];  Service: Endoscopy;  Laterality: N/A;  1030  . KNEE SURGERY    . NECK SURGERY  2000   Current Outpatient Medications on File Prior to Visit  Medication Sig Dispense Refill  . amLODipine (NORVASC) 10 MG tablet TAKE 1 TABLET BY MOUTH ONCE DAILY 90 tablet 3  . aspirin EC 81 MG tablet Take 1 tablet (81 mg total) by mouth daily.    Marland Kitchen atorvastatin (LIPITOR) 40 MG tablet Take 1 tablet (40 mg total) by mouth daily. 90 tablet 1  . hydrALAZINE (APRESOLINE) 100 MG tablet Take 1 tablet (100 mg total) by mouth 3 (three) times daily. 270 tablet 1  . lisinopril (ZESTRIL) 40 MG tablet TAKE 1 TABLET BY MOUTH ONCE DAILY 90 tablet 3  . metoprolol succinate (TOPROL XL) 25 MG 24 hr tablet Take 1 tablet (25 mg total) by mouth daily. 30 tablet 6  . Multiple Vitamins-Minerals (ONE-A-DAY MENS 50+ ADVANTAGE PO) Take 1 tablet by mouth daily.    . traMADol (ULTRAM) 50 MG tablet Take 1 tablet (50 mg total) by mouth every 6 (six) hours as needed. 15 tablet 0   No current facility-administered medications on file prior to visit.   Allergies  Allergen Reactions  . Bee Venom Swelling   Social History   Socioeconomic History  . Marital status: Married    Spouse name: Not on file  . Number of children: Not on file  . Years of education: Not on file  . Highest education level: Not on file  Occupational History  . Not on  file  Tobacco Use  . Smoking status: Never Smoker  . Smokeless tobacco: Never Used  Vaping Use  . Vaping Use: Never used  Substance and Sexual Activity  . Alcohol use: No    Alcohol/week: 0.0 standard drinks  . Drug use: No  . Sexual activity: Yes  Other Topics Concern  . Not on file  Social History Narrative   Married.    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   Social Determinants of Health   Financial Resource Strain: Not on file  Food Insecurity: Not on file  Transportation Needs: Not on file  Physical Activity: Not on file  Stress: Not on file  Social Connections: Not on file  Intimate Partner Violence: Not on file      Review of Systems  All other systems reviewed and are negative.      Objective:   Physical Exam Constitutional:      Appearance: Normal appearance.  HENT:     Nose:   Cardiovascular:     Rate and Rhythm:  Normal rate and regular rhythm.     Pulses: Normal pulses.     Heart sounds: Normal heart sounds.  Skin:      Neurological:     Mental Status: He is alert.           Assessment & Plan:  Inflamed skin tag  Each lesion was anesthetized with 0.1% lidocaine with epinephrine.  The lesion on the right shoulder was removed easily using a scalpel.  Hemostasis was achieved with Drysol and a Band-Aid.  Next the lesion was removed from his nose.  The lesion was removed in its entirety with a scalpel using a pair of forceps.  However it continued to bleed from a very small cut at the base of the stalk.  I was unable to achieve hemostasis using Drysol.  Therefore I had to switch to silver nitrate.  I explained to the patient that this would temporarily stain the skin black.  Repeated use of silver nitrate as well as pressure achieved hemostasis.  There was minimal blood loss.  I will  send the lesion from the nose to pathology

## 2020-09-04 NOTE — Addendum Note (Signed)
Addended by: Sheral Flow on: 09/04/2020 02:21 PM   Modules accepted: Orders

## 2020-09-04 NOTE — Addendum Note (Signed)
Addended by: Jenna Luo T on: 09/04/2020 12:03 PM   Modules accepted: Orders

## 2020-09-07 ENCOUNTER — Other Ambulatory Visit: Payer: Self-pay | Admitting: Family Medicine

## 2020-09-08 LAB — PATHOLOGY REPORT

## 2020-09-08 LAB — TISSUE SPECIMEN

## 2020-11-05 ENCOUNTER — Telehealth: Payer: Self-pay | Admitting: Family Medicine

## 2020-11-05 NOTE — Telephone Encounter (Signed)
Left message for patient to call back and schedule Medicare Annual Wellness Visit (AWV) in office.   If not able to come in office, please offer to do virtually or by telephone.   Last AWV:03/12/2018  Please schedule at anytime with [facility] Health Advisor.  If any questions, please contact me at [phone]

## 2020-11-28 ENCOUNTER — Encounter: Payer: Self-pay | Admitting: Family Medicine

## 2020-12-01 ENCOUNTER — Other Ambulatory Visit: Payer: Self-pay

## 2020-12-01 MED ORDER — METOPROLOL SUCCINATE ER 25 MG PO TB24: 25.0000 mg | ORAL_TABLET | Freq: Every day | ORAL | 1 refills | Status: DC

## 2020-12-14 NOTE — Telephone Encounter (Signed)
Left message on patient's voicemail to reschedule.

## 2020-12-29 ENCOUNTER — Ambulatory Visit: Payer: Medicare Other | Admitting: Family Medicine

## 2021-01-07 NOTE — Progress Notes (Signed)
Cardiology Office Note  Date: 01/08/2021   ID: George, Pratt 1961/03/10, MRN [id number]  PCP:  Susy Frizzle, MD  Cardiologist:  None Electrophysiologist:  None   Chief Complaint: Accelerated hypertension  History of Present Illness: George Pratt is a 60 y.o. male with a history of accelerated hypertension.  OSA not using CPAP.  Previous nuclear stress test 11/07/2017.  No ischemia, low risk, EF 61%.  Last seen by Dr. Bronson Ing 08/02/2018.  He denied any symptoms of chest pain, palpitations, shortness of breath, lightheadedness, dizziness, lower extremity edema, orthopnea, PND, or syncope.  Blood pressure was normal.  He was continuing his amlodipine, hydralazine, labetalol, lisinopril.  Chest CT from August 2017 showed aortic root dilatation of 4.2 cm in diameter.  He preferred to avoid further testing.  He was up to 231 pounds.  Exercise for weight control was discussed.  He was not using his CPAP for OSA.   He was last here for follow-up and had recently been admitted to [facility] for a week secondary to Covid pneumonia.  He was feeling feeling better now but still felt a little weak.  He denies any other sequelae from the virus.  He stated during his hospital stay systolic blood pressures were in the 120s to 130s.  He denies any current anginal or exertional symptoms, palpitations or arrhythmias, orthostatic symptoms, CVA or TIA-like symptoms, PND, orthopnea, bleeding, claudication-like symptoms, DVT or PE symptoms or lower extremity edema.  He is here for 54-monthfollow-up today.  States he has been doing well.  He has been more active and has lost some weight since last visit.  He denies any anginal or exertional symptoms, DOE or SOB, orthostatic symptoms, CVA or TIA-like symptoms, PND or orthopnea.  Denies any bleeding issues.  No claudication-like symptoms, DVT or PE-like symptoms, or lower extremity edema.  His blood pressure is elevated today.  He states  usually after he takes his medications it takes about 2 hours for antihypertensive medications to take effect.  His previous blood pressure on 09/04/2020 was 136/80.  EKG today shows normal sinus rhythm with a rate of 83 nonspecific interventricular conduction delay.  His current cardiac regimen includes amlodipine 10 mg daily, aspirin 81 mg daily, atorvastatin 40 mg daily, hydralazine 100 mg p.o. 3 times daily, lisinopril 40 mg daily, Toprol-XL 25 mg daily.  Past Medical History:  Diagnosis Date   Hypertension    Sleep apnea     Past Surgical History:  Procedure Laterality Date   APPENDECTOMY  2006   COLONOSCOPY N/A 10/01/2015   Procedure: COLONOSCOPY;  Surgeon: NRogene Houston MD;  [facility];  Service: Endoscopy;  Laterality: N/A;  1030   KNEE SURGERY     NECK SURGERY  2000    Current Outpatient Medications  Medication Sig Dispense Refill   amLODipine (NORVASC) 10 MG tablet TAKE 1 TABLET BY MOUTH ONCE DAILY 90 tablet 3   aspirin EC 81 MG tablet Take 1 tablet (81 mg total) by mouth daily.     atorvastatin (LIPITOR) 40 MG tablet Take 1 tablet (40 mg total) by mouth daily. 90 tablet 1   hydrALAZINE (APRESOLINE) 100 MG tablet Take 1 tablet (100 mg total) by mouth 3 (three) times daily. 270 tablet 1   lisinopril (ZESTRIL) 40 MG tablet TAKE 1 TABLET BY MOUTH ONCE DAILY 90 tablet 3   metoprolol succinate (TOPROL-XL) 25 MG 24 hr tablet Take 1 tablet (25 mg total) by mouth  daily. 90 tablet 1   Multiple Vitamins-Minerals (ONE-A-DAY MENS 50+ ADVANTAGE PO) Take 1 tablet by mouth daily.     topiramate (TOPAMAX) 25 MG tablet Take 2 tablets (50 mg total) by mouth 2 (two) times daily. For weight loss 120 tablet 3   traMADol (ULTRAM) 50 MG tablet Take 1 tablet (50 mg total) by mouth every 6 (six) hours as needed. 15 tablet 0   No current facility-administered medications for this visit.   Allergies:  Bee venom   Social History: The patient  reports that he has never smoked. He has never  used smokeless tobacco. He reports that he does not drink alcohol and does not use drugs.   Family History: The patient's family history includes Diabetes in his father; Heart disease in his mother; Heart failure in his mother; Hypertension in his brother, brother, brother, father, and mother; Vision loss in his father.   ROS:  Please see the history of present illness. Otherwise, complete review of systems is positive for none.  All other systems are reviewed and negative.   Physical Exam: VS:  BP (!) 160/66   Pulse 84   Ht '6\' 1"'$  (1.854 m)   Wt 237 lb 9.6 oz (107.8 kg)   SpO2 98%   BMI 31.35 kg/m , BMI Body mass index is 31.35 kg/m.  Wt Readings from Last 3 Encounters:  01/08/21 237 lb 9.6 oz (107.8 kg)  09/04/20 243 lb (110.2 kg)  06/30/20 243 lb (110.2 kg)    General: Patient appears comfortable at rest. Neck: Supple, no elevated JVP or carotid bruits, no thyromegaly. Lungs: Clear to auscultation, nonlabored breathing at rest. Cardiac: Regular rate and rhythm, no S3 or significant systolic murmur, no pericardial rub. Extremities: No pitting edema, distal pulses 2+. Skin: Warm and dry. Musculoskeletal: No kyphosis. Neuropsychiatric: Alert and oriented x3, affect grossly appropriate.  ECG: 01/08/2021 EKG normal sinus rhythm rate of 83, nonspecific intraventricular conduction delay  Recent Labwork: 04/14/2020: ALT 19; AST 14; BUN 18; Creat 1.10; Hemoglobin 14.8; Platelets 163; Potassium 4.1; Sodium 144     Component Value Date/Time   CHOL 165 06/12/2019 1049   TRIG 86 06/12/2019 1049   HDL 37 (L) 06/12/2019 1049   CHOLHDL 4.5 06/12/2019 1049   VLDL 13 03/03/2016 0912   LDLCALC 110 (H) 06/12/2019 1049    Other Studies Reviewed Today:  NST 11/07/2017 Study Result  Narrative & Impression  Blood pressure demonstrated a hypertensive response to exercise. There was no ST segment deviation noted during stress. Defect 1: There is a medium defect of mild severity present in  the mid inferoseptal and apical inferior location. Images are improved on stress when compared to rest (reverse redistribution). Hence, findings appear to be due to soft tissue attenuation. No significant ischemic zones. Regional wall motion is normal. This is a low risk study. Nuclear stress EF: 61%.     Echocardiogram 03/12/2014 Study Conclusions   - Procedure narrative: Transthoracic echocardiography. Image    quality was suboptimal. The study was technically difficult, as a    result of poor sound wave transmission.  - Left ventricle: The cavity size was normal. Systolic function was    normal. The estimated ejection fraction was in the range of 60%    to 65%. Wall motion was normal; there were no regional wall    motion abnormalities. Doppler parameters are consistent with    abnormal left ventricular relaxation (grade 1 diastolic    dysfunction). Mild to moderate concentric LVH.  -  Aorta: Mild aortic root dilatation. Aortic root dimension: 44 mm    (ED).   Transthoracic echocardiography.  M-mode, complete 2D, spectral  Doppler, and color Doppler.  Birthdate:  Patient birthdate:  February 14, 1961.  Age:  Patient is 60 yr old.  Sex:  Gender: male.  BMI: 31 kg/m^2.  Blood pressure:     153/108  Patient status:  Outpatient.  Study date:  Study date: 03/12/2014. Study time: 12:13  PM.  Location:  Echo laboratory.   -------------------------------------------------------------------   -------------------------------------------------------------------  Left ventricle:  The cavity size was normal. Systolic function was  normal. The estimated ejection fraction was in the range of 60% to  65%. Wall motion was normal; there were no regional wall motion  abnormalities. Mild to moderate concentric LVH. Doppler parameters  are consistent with abnormal left ventricular relaxation (grade 1  diastolic dysfunction). There was no evidence of elevated  ventricular filling pressure by Doppler  parameters.   -------------------------------------------------------------------  Aortic valve:   Structurally normal valve.   Cusp separation was  normal.  Doppler:  Transvalvular velocity was within the normal  range. There was no stenosis. There was no regurgitation.    Valve  area (VTI): 3.42 cm^2. Indexed valve area (VTI): 1.44 cm^2/m^2.  Valve area (Vmax): 3.23 cm^2. Indexed valve area (Vmax): 1.36  cm^2/m^2.    Peak gradient (S): 8 mm Hg.   -------------------------------------------------------------------  Aorta:  Mild aortic root dilatation.   -------------------------------------------------------------------  Mitral valve:   Structurally normal valve.   Leaflet separation was  normal.  Doppler:  Transvalvular velocity was within the normal  range. There was no evidence for stenosis. There was no  regurgitation.     Assessment and Plan:   1. Accelerated hypertension Blood pressure today elevated at 160/66.  Patient states it is usually like this in the morning until approximately 2 hours after he takes his antihypertensive medications.  He states after that his blood pressure is within normal limits.  Continue amlodipine 10 mg daily, hydralazine 100 mg p.o. 3 times daily, lisinopril 40 mg p.o. daily, Toprol-XL 25 mg daily.  Patient states he has some mild lower extremity edema which he attributes to amlodipine dosage.  2. Aortic root dilatation (HCC)  CTA chest 10/09/2017 No evidence of aortic dissection. Slight dilatation of the aortic root at 4.0 cm, stable. Imaging performed d/t CP w/radiation to L. Arm, sob, dizziness, weakness, r/o dissection.   3. Obesity, unspecified classification, unspecified obesity type, unspecified whether serious comorbidity present He is actually lost a few pounds recently approximately 6 pounds since April.  States he is being more active.  He has a new job requiring more activity.  4. OSA (obstructive sleep apnea) Not using CPAP at last  visit.  Continues not using CPAP  Medication Adjustments/Labs and Tests Ordered: Current medicines are reviewed at length with the patient today.  Concerns regarding medicines are outlined above.   Disposition: Follow-up with Dr. Harl Bowie or APP 6 months  Signed, Levell July, NP 01/08/2021 8:08 AM    [facility] [place], [place], [place] [address] Phone: [phone]; Fax: [phone]

## 2021-01-08 ENCOUNTER — Ambulatory Visit: Payer: Medicare Other | Admitting: Family Medicine

## 2021-01-08 ENCOUNTER — Other Ambulatory Visit: Payer: Self-pay

## 2021-01-08 ENCOUNTER — Encounter: Payer: Self-pay | Admitting: Family Medicine

## 2021-01-08 VITALS — BP 160/66 | HR 84 | Ht 73.0 in | Wt 237.6 lb

## 2021-01-08 DIAGNOSIS — G4733 Obstructive sleep apnea (adult) (pediatric): Secondary | ICD-10-CM

## 2021-01-08 DIAGNOSIS — I77810 Thoracic aortic ectasia: Secondary | ICD-10-CM | POA: Diagnosis not present

## 2021-01-08 DIAGNOSIS — I10 Essential (primary) hypertension: Secondary | ICD-10-CM | POA: Diagnosis not present

## 2021-01-08 DIAGNOSIS — E669 Obesity, unspecified: Secondary | ICD-10-CM | POA: Diagnosis not present

## 2021-01-08 NOTE — Patient Instructions (Addendum)

## 2021-02-16 ENCOUNTER — Other Ambulatory Visit: Payer: Self-pay | Admitting: Family Medicine

## 2021-02-20 ENCOUNTER — Other Ambulatory Visit: Payer: Self-pay | Admitting: Family Medicine

## 2021-04-20 ENCOUNTER — Telehealth: Payer: Self-pay | Admitting: Family Medicine

## 2021-04-20 NOTE — Telephone Encounter (Signed)
I attempted to leave amessage for patient to call back and schedule Medicare Annual Wellness Visit (AWV) in office. No voice mail.  If not able to come in office, please offer to do virtually or by telephone.  Left office number and my jabber [phone].  Last AWV:03/12/2018  Please schedule at anytime with Nurse Health Advisor.

## 2021-05-12 ENCOUNTER — Telehealth: Payer: Self-pay | Admitting: Family Medicine

## 2021-05-12 NOTE — Telephone Encounter (Signed)
Left message for patient to call back and schedule Medicare Annual Wellness Visit (AWV) in office.   If not able to come in office, please offer to do virtually or by telephone.  Left office number and my jabber [phone].  Last AWV:03/12/2018  Please schedule at anytime with Nurse Health Advisor.

## 2021-06-08 ENCOUNTER — Telehealth: Payer: Self-pay | Admitting: Family Medicine

## 2021-06-08 NOTE — Telephone Encounter (Signed)
Left message for patient to call back and schedule Medicare Annual Wellness Visit (AWV) in office.   If not able to come in office, please offer to do virtually or by telephone.  Left office number and my jabber [phone].  AWVI eligible as of 03/12/2018  Please schedule at anytime with Nurse Health Advisor.

## 2021-06-21 ENCOUNTER — Ambulatory Visit (HOSPITAL_COMMUNITY)
Admission: RE | Admit: 2021-06-21 | Discharge: 2021-06-21 | Disposition: A | Discharge status: Home / Self Care | Payer: Medicare Other | Source: Ambulatory Visit | Attending: Family Medicine | Admitting: Family Medicine

## 2021-06-21 ENCOUNTER — Encounter: Payer: Self-pay | Admitting: Family Medicine

## 2021-06-21 ENCOUNTER — Ambulatory Visit (INDEPENDENT_AMBULATORY_CARE_PROVIDER_SITE_OTHER): Payer: Medicare Other | Admitting: Family Medicine

## 2021-06-21 ENCOUNTER — Other Ambulatory Visit: Payer: Self-pay

## 2021-06-21 VITALS — BP 164/98 | HR 84 | Temp 97.9°F | Resp 18 | Ht 73.0 in | Wt 238.0 lb

## 2021-06-21 DIAGNOSIS — R053 Chronic cough: Secondary | ICD-10-CM | POA: Diagnosis present

## 2021-06-21 MED ORDER — LEVOCETIRIZINE DIHYDROCHLORIDE 5 MG PO TABS: 5.0000 mg | ORAL_TABLET | Freq: Every evening | ORAL | 3 refills | Status: AC

## 2021-06-21 MED ORDER — VALSARTAN 320 MG PO TABS: 320.0000 mg | ORAL_TABLET | Freq: Every day | ORAL | 3 refills | Status: DC

## 2021-06-21 NOTE — Addendum Note (Signed)
Addended by: Jenna Luo T on: 06/21/2021 04:43 PM   Modules accepted: Level of Service

## 2021-06-21 NOTE — Progress Notes (Signed)
Subjective:    Patient ID: George Pratt, male    DOB: [birth year], 61 y.o.   MRN: [id number]  HPI Patient states that he has had a cough off and on for the last 6 months especially but really since he had COVID in 2021.  He states that he will have weeks of coughing.  He states that the cough may get better for 1 to 2 weeks and then the cough started again.  It tends to be set off by any virus.  He states that he has been "sick" with a cough every month for the last year and almost constantly for the last 6 months.  He denies any hemoptysis.  He does have a cough and sputum that is typically clear but occasionally can be green.  At times it is a dry hacking cough.  He denies any chest pain.  He denies any pleurisy.  He denies any fevers or chills or weight loss.  He is on lisinopril  Past Medical History:  Diagnosis Date   Hypertension    Sleep apnea    Past Surgical History:  Procedure Laterality Date   APPENDECTOMY  2006   COLONOSCOPY N/A 10/01/2015   Procedure: COLONOSCOPY;  Surgeon: Rogene Houston, MD;  [facility];  Service: Endoscopy;  Laterality: N/A;  [place]   Current Outpatient Medications on File Prior to Visit  Medication Sig Dispense Refill   amLODipine (NORVASC) 10 MG tablet TAKE 1 TABLET BY MOUTH ONCE DAILY 90 tablet 3   aspirin EC 81 MG tablet Take 1 tablet (81 mg total) by mouth daily.     atorvastatin (LIPITOR) 40 MG tablet TAKE 1 TABLET BY MOUTH EVERY DAY 90 tablet 1   hydrALAZINE (APRESOLINE) 100 MG tablet TAKE 1 TABLET BY MOUTH THREE TIMES DAILY 270 tablet 1   lisinopril (ZESTRIL) 40 MG tablet TAKE 1 TABLET BY MOUTH ONCE DAILY 90 tablet 3   metoprolol succinate (TOPROL-XL) 25 MG 24 hr tablet Take 1 tablet (25 mg total) by mouth daily. 90 tablet 1   Multiple Vitamins-Minerals (ONE-A-DAY MENS 50+ ADVANTAGE PO) Take 1 tablet by mouth daily.     topiramate (TOPAMAX) 25 MG tablet Take 2 tablets (50 mg total) by mouth 2 (two)  times daily. For weight loss 120 tablet 3   traMADol (ULTRAM) 50 MG tablet Take 1 tablet (50 mg total) by mouth every 6 (six) hours as needed. 15 tablet 0   No current facility-administered medications on file prior to visit.   Allergies  Allergen Reactions   Bee Venom Swelling   Social History   Socioeconomic History   Marital status: Married    Spouse name: Not on file   Number of children: Not on file   Years of education: Not on file   Highest education level: Not on file  Occupational History   Not on file  Tobacco Use   Smoking status: Never   Smokeless tobacco: Never  Vaping Use   Vaping Use: Never used  Substance and Sexual Activity   Alcohol use: No    Alcohol/week: 0.0 standard drinks   Drug use: No   Sexual activity: Yes  Other Topics Concern   Not on file  Social History Narrative   Married.    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    --Is available for students to call him, email him, etc  Says it is for a group of multiple colleges--in [employer], etc.   Social Determinants of Health   Financial Resource Strain: Not on file  Food Insecurity: Not on file  Transportation Needs: Not on file  Physical Activity: Not on file  Stress: Not on file  Social Connections: Not on file  Intimate Partner Violence: Not on file    Review of Systems  All other systems reviewed and are negative.     Objective:   Physical Exam Vitals reviewed.  Constitutional:      General: He is not in acute distress.    Appearance: Normal appearance. He is normal weight. He is not ill-appearing, toxic-appearing or diaphoretic.  HENT:     Nose: No congestion or rhinorrhea.     Mouth/Throat:     Mouth: Mucous membranes are moist.     Pharynx: No oropharyngeal exudate or posterior oropharyngeal erythema.  Eyes:     Conjunctiva/sclera: Conjunctivae normal.  Cardiovascular:     Rate and Rhythm: Normal rate and regular rhythm.     Heart  sounds: Normal heart sounds. No murmur heard.   No friction rub.  Pulmonary:     Effort: Pulmonary effort is normal. No respiratory distress.     Breath sounds: Normal breath sounds. No stridor. No wheezing, rhonchi or rales.  Neurological:     Mental Status: He is alert.          Assessment & Plan:  Chronic cough - Plan: DG Chest 2 View We discussed the potential causes of a chronic cough.  This includes allergies and postnasal drip, cough variant asthma, acid reflux, and his ACE inhibitor.  Patient states that he is not having any acid reflux.  Never smoker never had a history of asthma.  He never hears himself wheezing.  He believes that allergies may be a component.  He states the cough began in earnest 6 months ago when he started a new job.  At this new job, he states that people are always coughing and he is question that there may be mold in the building.  Therefore I will start the patient on Xyzal 5 mg a day for possible allergies and postnasal drip and also discontinue the lisinopril.  We will replace lisinopril with Diovan 320 mg a day and reassess in 4 weeks.  Obtain a chest x-ray.

## 2021-07-06 ENCOUNTER — Telehealth: Payer: Self-pay | Admitting: Family Medicine

## 2021-07-06 NOTE — Telephone Encounter (Signed)
Left message for patient to call back and schedule Medicare Annual Wellness Visit (AWV) in office.   If not able to come in office, please offer to do virtually or by telephone.  Left office number and my jabber [phone].  Last AWV:03/12/2018  Please schedule at anytime with Nurse Health Advisor.

## 2021-07-22 ENCOUNTER — Encounter: Payer: Self-pay | Admitting: Cardiology

## 2021-07-22 ENCOUNTER — Other Ambulatory Visit: Payer: Self-pay

## 2021-07-22 ENCOUNTER — Encounter: Payer: Self-pay | Admitting: Family Medicine

## 2021-07-22 ENCOUNTER — Ambulatory Visit: Payer: Medicare Other | Admitting: Cardiology

## 2021-07-22 ENCOUNTER — Ambulatory Visit (INDEPENDENT_AMBULATORY_CARE_PROVIDER_SITE_OTHER): Payer: Medicare Other | Admitting: Family Medicine

## 2021-07-22 VITALS — BP 148/78 | HR 69 | Temp 97.0°F | Resp 18 | Ht 73.0 in | Wt 243.0 lb

## 2021-07-22 VITALS — BP 150/70 | HR 80 | Ht 73.0 in | Wt 243.0 lb

## 2021-07-22 DIAGNOSIS — I77810 Thoracic aortic ectasia: Secondary | ICD-10-CM

## 2021-07-22 DIAGNOSIS — G4733 Obstructive sleep apnea (adult) (pediatric): Secondary | ICD-10-CM

## 2021-07-22 DIAGNOSIS — I10 Essential (primary) hypertension: Secondary | ICD-10-CM | POA: Diagnosis not present

## 2021-07-22 MED ORDER — CARVEDILOL 6.25 MG PO TABS: 6.2500 mg | ORAL_TABLET | Freq: Two times a day (BID) | ORAL | 6 refills | Status: DC

## 2021-07-22 NOTE — Progress Notes (Signed)
Clinical Summary George Pratt is a 61 y.o.male former patient of Dr Bronson Ing, this is our first visit together.    1.HTN - long history of difficult to contorl HTN - valsartan started by pcp Jan 23,2023. Had been on lisinopril, cough has resolved off ACEi - previously low bps on clonidine - had been on labetalol, unclear when switched and indication -02/2014 chlortahldione started. Notes mention caused dry mouth, constipation, ED. Symptoms continued off chlorthaldione it appears  - home bp's 150s/80s-90s    2.Aortic root dilatation - 09/2017 CTA aortic root 4 cm.     3.OSA - mixed compliance - had been seen by Dohmeier.    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  His father is in his 25's and is a retired Lexicographer who practiced for 28 years in [place].  His father lives with his family now.  Working for [employer] teacher [employer].  Past Medical History:  Diagnosis Date   Hypertension    Sleep apnea      Allergies  Allergen Reactions   Bee Venom Swelling     Current Outpatient Medications  Medication Sig Dispense Refill   amLODipine (NORVASC) 10 MG tablet TAKE 1 TABLET BY MOUTH ONCE DAILY 90 tablet 3   aspirin EC 81 MG tablet Take 1 tablet (81 mg total) by mouth daily.     atorvastatin (LIPITOR) 40 MG tablet TAKE 1 TABLET BY MOUTH EVERY DAY 90 tablet 1   hydrALAZINE (APRESOLINE) 100 MG tablet TAKE 1 TABLET BY MOUTH THREE TIMES DAILY 270 tablet 1   levocetirizine (XYZAL) 5 MG tablet Take 1 tablet (5 mg total) by mouth every evening. 30 tablet 3   lisinopril (ZESTRIL) 40 MG tablet TAKE 1 TABLET BY MOUTH ONCE DAILY 90 tablet 3   metoprolol succinate (TOPROL-XL) 25 MG 24 hr tablet Take 1 tablet (25 mg total) by mouth daily. 90 tablet 1   Multiple Vitamins-Minerals (ONE-A-DAY MENS  50+ ADVANTAGE PO) Take 1 tablet by mouth daily.     topiramate (TOPAMAX) 25 MG tablet Take 2 tablets (50 mg total) by mouth 2 (two) times daily. For weight loss 120 tablet 3   traMADol (ULTRAM) 50 MG tablet Take 1 tablet (50 mg total) by mouth every 6 (six) hours as needed. 15 tablet 0   valsartan (DIOVAN) 320 MG tablet Take 1 tablet (320 mg total) by mouth daily. Patient should stop lisinopril 90 tablet 3   No current facility-administered medications for this visit.     Past Surgical History:  Procedure Laterality Date   APPENDECTOMY  2006   COLONOSCOPY N/A 10/01/2015   Procedure: COLONOSCOPY;  Surgeon: Rogene Houston, MD;  [facility];  Service: Endoscopy;  Laterality: N/A;  1030   KNEE SURGERY     NECK SURGERY  2000     Allergies  Allergen Reactions   Bee Venom Swelling      Family History  Problem Relation Age of Onset   Hypertension Mother    Heart failure Mother    Heart disease Mother    Hypertension Father    Diabetes Father    Vision loss Father    Hypertension Brother    Hypertension Brother    Hypertension Brother      Social History George Pratt reports that he has never smoked. He  has never used smokeless tobacco. George Pratt reports no history of alcohol use.   Review of Systems CONSTITUTIONAL: No weight loss, fever, chills, weakness or fatigue.  HEENT: Eyes: No visual loss, blurred vision, double vision or yellow sclerae.No hearing loss, sneezing, congestion, runny nose or sore throat.  SKIN: No rash or itching.  CARDIOVASCULAR: per hpi RESPIRATORY: No shortness of breath, cough or sputum.  GASTROINTESTINAL: No anorexia, nausea, vomiting or diarrhea. No abdominal pain or blood.  GENITOURINARY: No burning on urination, no polyuria NEUROLOGICAL: No headache, dizziness, syncope, paralysis, ataxia, numbness or tingling in the extremities. No change in bowel or bladder control.  MUSCULOSKELETAL: No muscle, back pain, joint pain or stiffness.   LYMPHATICS: No enlarged nodes. No history of splenectomy.  PSYCHIATRIC: No history of depression or anxiety.  ENDOCRINOLOGIC: No reports of sweating, cold or heat intolerance. No polyuria or polydipsia.  Marland Kitchen   Physical Examination Today's Vitals   07/22/21 0833  BP: (!) 150/70  Pulse: 80  SpO2: 98%  Weight: 243 lb (110.2 kg)  Height: 6\' 1"  (1.854 m)   Body mass index is 32.06 kg/m.  Gen: resting comfortably, no acute distress HEENT: no scleral icterus, pupils equal round and reactive, no palptable cervical adenopathy,  CV: RRR, no m/r/g no jvd Resp: Clear to auscultation bilaterally GI: abdomen is soft, non-tender, non-distended, normal bowel sounds, no hepatosplenomegaly MSK: extremities are warm, no edema.  Skin: warm, no rash Neuro:  no focal deficits Psych: appropriate affect   Diagnostic Studies  10/2017 nuclear stress Blood pressure demonstrated a hypertensive response to exercise. There was no ST segment deviation noted during stress. Defect 1: There is a medium defect of mild severity present in the mid inferoseptal and apical inferior location. Images are improved on stress when compared to rest (reverse redistribution). Hence, findings appear to be due to soft tissue attenuation. No significant ischemic zones. Regional wall motion is normal. This is a low risk study. Nuclear stress EF: 61%.   Assessment and Plan  HTN -historically difficult to control bp - medication side effects as listed above - d/c toprol, change to coreg for more bp effect. Future option could be aldactone, don't see has been on before. Had side effects on chlorthalidone in the past  2. OSA - mixed compliance with cpap - previously seen by Dr Brett Fairy, would like closer option, refer to [facility]  3. Aortic root dilatation - repeat echo   F/u 6 months   Arnoldo Lenis, M.D

## 2021-07-22 NOTE — Patient Instructions (Addendum)
Medication Instructions:  Begin Coreg 6.25mg  twice a day  Stop Toprol XL (Metoprolol Succ) Continue all other medications.     Labwork: none  Testing/Procedures: Your physician has requested that you have an echocardiogram. Echocardiography is a painless test that uses sound waves to create images of your heart. It provides your doctor with information about the size and shape of your heart and how well your hearts chambers and valves are working. This procedure takes approximately one hour. There are no restrictions for this procedure. Office will contact with results via phone or letter.     Follow-Up: 6 months   Any Other Special Instructions Will Be Listed Below (If Applicable). You have been referred to:  Pulmonology   If you need a refill on your cardiac medications before your next appointment, please call your pharmacy.

## 2021-07-22 NOTE — Progress Notes (Signed)
Subjective:    Patient ID: George Pratt, male    DOB: 04-Oct-1960, 61 y.o.   MRN: [id number]  Cough 06/21/21 Patient states that he has had a cough off and on for the last 6 months especially but really since he had [place] in 2021.  He states that he will have weeks of coughing.  He states that the cough may get better for 1 to 2 weeks and then the cough started again.  It tends to be set off by any virus.  He states that he has been "sick" with a cough every month for the last year and almost constantly for the last 6 months.  He denies any hemoptysis.  He does have a cough and sputum that is typically clear but occasionally can be green.  At times it is a dry hacking cough.  He denies any chest pain.  He denies any pleurisy.  He denies any fevers or chills or weight loss.  He is on lisinopril.   At that time, my plan was:  We discussed the potential causes of a chronic cough.  This includes allergies and postnasal drip, cough variant asthma, acid reflux, and his ACE inhibitor.  Patient states that he is not having any acid reflux.  Never smoker never had a history of asthma.  He never hears himself wheezing.  He believes that allergies may be a component.  He states the cough began in earnest 6 months ago when he started a new job.  At this new job, he states that people are always coughing and he is question that there may be mold in the building.  Therefore I will start the patient on Xyzal 5 mg a day for possible allergies and postnasal drip and also discontinue the lisinopril.  We will replace lisinopril with Diovan 320 mg a day and reassess in 4 weeks.  Obtain a chest x-ray.  07/22/21 Patient's cough is completely stopped since discontinuing lisinopril.  His blood pressure remains high.  He saw his cardiologist recently who discontinued Toprol and started the patient on carvedilol.  He denies any chest pain shortness of breath or dyspnea on exertion.  Dr. Harl Bowie recommended adding Aldactone if blood  pressure remains elevated.  Past Medical History:  Diagnosis Date   Hypertension    Sleep apnea    Past Surgical History:  Procedure Laterality Date   APPENDECTOMY  2006   COLONOSCOPY N/A 10/01/2015   Procedure: COLONOSCOPY;  Surgeon: Rogene Houston, MD;  [facility];  Service: Endoscopy;  Laterality: N/A;  [place]   Current Outpatient Medications on File Prior to Visit  Medication Sig Dispense Refill   amLODipine (NORVASC) 10 MG tablet TAKE 1 TABLET BY MOUTH ONCE DAILY 90 tablet 3   aspirin EC 81 MG tablet Take 1 tablet (81 mg total) by mouth daily.     atorvastatin (LIPITOR) 40 MG tablet TAKE 1 TABLET BY MOUTH EVERY DAY 90 tablet 1   carvedilol (COREG) 6.25 MG tablet Take 1 tablet (6.25 mg total) by mouth 2 (two) times daily. 60 tablet 6   hydrALAZINE (APRESOLINE) 100 MG tablet TAKE 1 TABLET BY MOUTH THREE TIMES DAILY 270 tablet 1   levocetirizine (XYZAL) 5 MG tablet Take 1 tablet (5 mg total) by mouth every evening. 30 tablet 3   Multiple Vitamins-Minerals (ONE-A-DAY MENS 50+ ADVANTAGE PO) Take 1 tablet by mouth daily.     topiramate (  TOPAMAX) 25 MG tablet Take 2 tablets (50 mg total) by mouth 2 (two) times daily. For weight loss 120 tablet 3   traMADol (ULTRAM) 50 MG tablet Take 1 tablet (50 mg total) by mouth every 6 (six) hours as needed. 15 tablet 0   valsartan (DIOVAN) 320 MG tablet Take 1 tablet (320 mg total) by mouth daily. Patient should stop lisinopril 90 tablet 3   No current facility-administered medications on file prior to visit.   Allergies  Allergen Reactions   Bee Venom Swelling   Lisinopril Cough   Social History   Socioeconomic History   Marital status: Married    Spouse name: Not on file   Number of children: Not on file   Years of education: Not on file   Highest education level: Not on file  Occupational History   Not on file  Tobacco Use   Smoking status: Never   Smokeless tobacco: Never  Vaping  Use   Vaping Use: Never used  Substance and Sexual Activity   Alcohol use: No    Alcohol/week: 0.0 standard drinks   Drug use: No   Sexual activity: Yes  Other Topics Concern   Not on file  Social History Narrative   Married.    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   Social Determinants of Health   Financial Resource Strain: Not on file  Food Insecurity: Not on file  Transportation Needs: Not on file  Physical Activity: Not on file  Stress: Not on file  Social Connections: Not on file  Intimate Partner Violence: Not on file    Review of Systems  Respiratory:  Positive for cough.   All other systems reviewed and are negative.     Objective:   Physical Exam Vitals reviewed.  Constitutional:      General: He is not in acute distress.    Appearance: Normal appearance. He is normal weight. He is not ill-appearing, toxic-appearing or diaphoretic.  HENT:     Nose: No congestion or rhinorrhea.     Mouth/Throat:     Mouth: Mucous membranes are moist.     Pharynx: No oropharyngeal exudate or posterior oropharyngeal erythema.  Eyes:     Conjunctiva/sclera: Conjunctivae normal.  Cardiovascular:     Rate and Rhythm: Normal rate and regular rhythm.     Heart sounds: Normal heart sounds. No murmur heard.   No friction rub.  Pulmonary:     Effort: Pulmonary effort is normal. No respiratory distress.     Breath sounds: Normal breath sounds. No stridor. No wheezing, rhonchi or rales.  Neurological:     Mental Status: He is alert.          Assessment & Plan:  Essential hypertension Chronic cough is subsided.  Discontinue Xyzal.  Continue valsartan.  Recheck blood pressure in 1 month.  If still greater than 140/90 add Aldactone

## 2021-08-05 ENCOUNTER — Telehealth: Payer: Self-pay | Admitting: Family Medicine

## 2021-08-05 NOTE — Telephone Encounter (Signed)
Left message for patient to call back and schedule Medicare Annual Wellness Visit (AWV) in office.  ? ?If not able to come in office, please offer to do virtually or by telephone.  Left office number and my jabber [phone]. ? ?Last AWV:03/12/2018 ? ?Please schedule at anytime with Nurse Health Advisor. ?  ?

## 2021-08-17 ENCOUNTER — Ambulatory Visit (INDEPENDENT_AMBULATORY_CARE_PROVIDER_SITE_OTHER): Payer: Medicare Other

## 2021-08-17 DIAGNOSIS — I77810 Thoracic aortic ectasia: Secondary | ICD-10-CM

## 2021-08-17 LAB — ECHOCARDIOGRAM COMPLETE
AR max vel: 3.36 cm2
AV Area VTI: 3.04 cm2
AV Area mean vel: 2.92 cm2
AV Mean grad: 5 mmHg
AV Peak grad: 9.7 mmHg
AV Vena cont: 0.55 cm
Ao pk vel: 1.56 m/s
Area-P 1/2: 3.16 cm2
Calc EF: 60.5 %
S' Lateral: 3.59 cm
Single Plane A2C EF: 63.4 %
Single Plane A4C EF: 56.8 %

## 2021-08-19 ENCOUNTER — Encounter: Payer: Self-pay | Admitting: Cardiology

## 2021-08-26 ENCOUNTER — Encounter: Payer: Self-pay | Admitting: Family Medicine

## 2021-08-27 ENCOUNTER — Other Ambulatory Visit: Payer: Self-pay | Admitting: *Deleted

## 2021-08-27 ENCOUNTER — Other Ambulatory Visit: Payer: Self-pay | Admitting: Family Medicine

## 2021-08-27 MED ORDER — HYDRALAZINE HCL 100 MG PO TABS: 100.0000 mg | ORAL_TABLET | Freq: Three times a day (TID) | ORAL | 3 refills | Status: DC

## 2021-09-01 ENCOUNTER — Telehealth: Payer: Self-pay | Admitting: Family Medicine

## 2021-09-01 NOTE — Telephone Encounter (Signed)
Left message for patient to call back and schedule Medicare Annual Wellness Visit (AWV) in office.  ? ?If not able to come in office, please offer to do virtually or by telephone.  Left office number and my jabber [phone]. ? ?Last AWV:03/12/2018 ? ?Please schedule at anytime with Nurse Health Advisor. ?  ?

## 2021-09-28 ENCOUNTER — Other Ambulatory Visit: Payer: Self-pay | Admitting: Family Medicine

## 2021-09-29 ENCOUNTER — Telehealth: Payer: Self-pay | Admitting: Family Medicine

## 2021-09-29 NOTE — Telephone Encounter (Signed)
Requested medication (s) are due for refill today - expired Rx ? ?Requested medication (s) are on the active medication list -yes ? ?Future visit scheduled -no ? ?Last refill: 09/04/20 #120 3RF ? ?Notes to clinic: Request RF: expired Rx, fails lab protocol-over 1 year -2021 ? ?Requested Prescriptions  ?Pending Prescriptions Disp Refills  ? topiramate (TOPAMAX) 25 MG tablet [Pharmacy Med Name: topiramate 25 mg tablet] 120 tablet 3  ?  Sig: TAKE TWO TABLETS BY MOUTH TWICE DAILY (FOR weight loss)  ?  ? Neurology: Anticonvulsants - topiramate & zonisamide Failed - 09/28/2021  6:16 PM  ?  ?  Failed - Cr in normal range and within 360 days  ?  Creat  ?Date Value Ref Range Status  ?04/14/2020 1.10 0.70 - 1.25 mg/dL Final  ?  Comment:  ?  For patients >50 years of age, the reference limit ?for Creatinine is approximately 13% higher for people ?identified as African-American. ?. ?  ?  ?  ?  ?  Failed - CO2 in normal range and within 360 days  ?  CO2  ?Date Value Ref Range Status  ?04/14/2020 24 20 - 32 mmol/L Final  ?  ?  ?  ?  Failed - ALT in normal range and within 360 days  ?  ALT  ?Date Value Ref Range Status  ?04/14/2020 19 9 - 46 U/L Final  ?  ?  ?  ?  Failed - AST in normal range and within 360 days  ?  AST  ?Date Value Ref Range Status  ?04/14/2020 14 10 - 35 U/L Final  ?  ?  ?  ?  Failed - Completed PHQ-2 or PHQ-9 in the last 360 days  ?  ?  Passed - Valid encounter within last 12 months  ?  Recent Outpatient Visits   ? ?      ? 2 months ago Essential hypertension  ? [facility] Pickard, Cammie Mcgee, MD  ? 3 months ago Chronic cough  ? [facility] Pickard, Cammie Mcgee, MD  ? 1 year ago Inflamed skin tag  ? [facility] Pickard, Cammie Mcgee, MD  ? 1 year ago COVID-19  ? [facility] Pickard, Cammie Mcgee, MD  ? 1 year ago Need for immunization against influenza  ? [facility] Pickard, Cammie Mcgee, MD  ? ?  ?  ?Future Appointments   ? ?        ? In 4  months Branch, Alphonse Guild, MD [place], [place]  ? ?  ? ? ?  ?  ?  ? ? ? ?Requested Prescriptions  ?Pending Prescriptions Disp Refills  ? topiramate (TOPAMAX) 25 MG tablet [Pharmacy Med Name: topiramate 25 mg tablet] 120 tablet 3  ?  Sig: TAKE TWO TABLETS BY MOUTH TWICE DAILY (FOR weight loss)  ?  ? Neurology: Anticonvulsants - topiramate & zonisamide Failed - 09/28/2021  6:16 PM  ?  ?  Failed - Cr in normal range and within 360 days  ?  Creat  ?Date Value Ref Range Status  ?04/14/2020 1.10 0.70 - 1.25 mg/dL Final  ?  Comment:  ?  For patients >46 years of age, the reference limit ?for Creatinine is approximately 13% higher for people ?identified as African-American. ?. ?  ?  ?  ?  ?  Failed - CO2 in normal range and within 360 days  ?  CO2  ?  Date Value Ref Range Status  ?04/14/2020 24 20 - 32 mmol/L Final  ?  ?  ?  ?  Failed - ALT in normal range and within 360 days  ?  ALT  ?Date Value Ref Range Status  ?04/14/2020 19 9 - 46 U/L Final  ?  ?  ?  ?  Failed - AST in normal range and within 360 days  ?  AST  ?Date Value Ref Range Status  ?04/14/2020 14 10 - 35 U/L Final  ?  ?  ?  ?  Failed - Completed PHQ-2 or PHQ-9 in the last 360 days  ?  ?  Passed - Valid encounter within last 12 months  ?  Recent Outpatient Visits   ? ?      ? 2 months ago Essential hypertension  ? [facility] Pickard, Cammie Mcgee, MD  ? 3 months ago Chronic cough  ? [facility] Pickard, Cammie Mcgee, MD  ? 1 year ago Inflamed skin tag  ? [facility] Pickard, Cammie Mcgee, MD  ? 1 year ago COVID-19  ? [facility] Pickard, Cammie Mcgee, MD  ? 1 year ago Need for immunization against influenza  ? [facility] Pickard, Cammie Mcgee, MD  ? ?  ?  ?Future Appointments   ? ?        ? In 4 months Branch, Alphonse Guild, MD [place], [place]  ? ?  ? ? ?  ?  ?  ? ? ? ?

## 2021-09-29 NOTE — Telephone Encounter (Signed)
Left message for patient to call back and schedule Medicare Annual Wellness Visit (AWV) in office.  ? ?If not able to come in office, please offer to do virtually or by telephone.  Left office number and my jabber [phone]. ? ?Last AWV:03/12/2018 ? ?Please schedule at anytime with Nurse Health Advisor. ?  ?

## 2021-09-30 ENCOUNTER — Ambulatory Visit: Payer: Medicare Other | Admitting: Pulmonary Disease

## 2021-09-30 ENCOUNTER — Encounter: Payer: Self-pay | Admitting: Pulmonary Disease

## 2021-09-30 DIAGNOSIS — I10 Essential (primary) hypertension: Secondary | ICD-10-CM | POA: Diagnosis not present

## 2021-09-30 DIAGNOSIS — G4733 Obstructive sleep apnea (adult) (pediatric): Secondary | ICD-10-CM

## 2021-09-30 NOTE — Assessment & Plan Note (Signed)
He stopped using  CPAP about 2 years ago.  Last home sleep test only showed mild sleep disordered breathing.  He does have refractory hypertension and aortic root dilatation.  We will reassess with a home sleep test.  We will look for positional component of his of OSA. ?We will only resume CPAP if he has more than moderate AHI.  Otherwise positional therapy may be adequate he is working some more on weight loss and I recommended at least a weight loss of about 20 pounds. ?We also discussed dental appliance as an option should he have mild OSA ? ? The pathophysiology of obstructive sleep apnea , it's cardiovascular consequences & modes of treatment including CPAP were discused with the patient in detail & they evidenced understanding. ? ?

## 2021-09-30 NOTE — Assessment & Plan Note (Signed)
Controlled on 4 medications. ?Weight gain was discussed as a risk factor for both OSA and hypertension ?

## 2021-09-30 NOTE — Progress Notes (Signed)
? ?Subjective:  ? ? Patient ID: George Pratt, male    DOB: [birth year], 61 y.o.   MRN: [id number] ? ?HPI ? ?61 year old man presents for reassessment of obstructive sleep apnea. 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 ?He was diagnosed in 2013 and used CPAP for many years.  He was reassessed in 2019 by home sleep test by Dr. Brett Fairy from neurology and was found to have mild OSA.  Auto CPAP was renewed at 5 to 12 cm and a download showed good compliance with average pressure of 10 cm. ?His father was a retired Lexicographer in [place] , he had several nocturnal awakenings when he was taking care of dad and during the [place] pandemic he stopped using his CPAP machine. ?He has refractory hypertension requiring 4 medications and an aortic root dilatation for which she sees cardiology, their consultation was reviewed.  Hence he is referred to reassess for OSA. ? ?He reports that he sleeps on his side he places a long pillow on his back and his wife has not noted any snoring in the last 6 months. ?Epworth sleepiness score is 4 and denies daytime sleepiness or drowsiness.  Bedtime is around 10 PM, sleep latency is 30 minutes, he sleeps on his side with 1 pillow, reports 3-4 nocturnal awakenings including nocturia and is out of bed around 7 AM feeling rested without dryness of mouth or headaches. ?His weight was 232 pounds around the sleep study and he now weighs 238 pounds but he had gained up to 250 ?There is no history suggestive of cataplexy, sleep paralysis or parasomnias ? ? ? ?Significant tests/ events reviewed ?09/2017 HST (apnealink ) - wt 232 lbs - AHI 9/h, lowest desat 87% ? ?Past Medical History:  ?Diagnosis Date  ? Hypertension   ? Sleep apnea   ? ?Past Surgical History:  ?Procedure Laterality Date  ? APPENDECTOMY  2006  ? COLONOSCOPY N/A 10/01/2015  ? Procedure: COLONOSCOPY;  Surgeon: Rogene Houston, MD;  Location: [facility];  Service:  Endoscopy;  Laterality: N/A;  1030  ? KNEE SURGERY    ? NECK SURGERY  2000  ? ? ?Allergies  ?Allergen Reactions  ? Bee Venom Swelling  ? Lisinopril Cough  ? ? ?Social History  ? ?Socioeconomic History  ? Marital status: Married  ?  Spouse name: Not on file  ? Number of children: Not on file  ? Years of education: Not on file  ? Highest education level: Not on file  ?Occupational History  ? Not on file  ?Tobacco Use  ? Smoking status: Never  ? Smokeless tobacco: Never  ?Vaping Use  ? Vaping Use: Never used  ?Substance and Sexual Activity  ? Alcohol use: No  ?  Alcohol/week: 0.0 standard drinks  ? Drug use: No  ? Sexual activity: Yes  ?Other Topics Concern  ? Not on file  ?Social History Narrative  ? Married.   ? 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   ? --Is available for students to call him, email him, etc  ? Says it is for a group of multiple colleges--in [facility], etc.  ? ?Social Determinants of Health  ? ?Financial Resource Strain: Not on file  ?Food Insecurity: Not on file  ?Transportation Needs: Not on file  ?Physical Activity: Not on file  ?Stress: Not on file  ?Social Connections: Not on  file  ?Intimate Partner Violence: Not on file  ? ? ?Family History  ?Problem Relation Age of Onset  ? Hypertension Mother   ? Heart failure Mother   ? Heart disease Mother   ? Hypertension Father   ? Diabetes Father   ? Vision loss Father   ? Hypertension Brother   ? Hypertension Brother   ? Hypertension Brother   ? ? ? ? ?Review of Systems ?Constitutional: negative for anorexia, fevers and sweats  ?Eyes: negative for irritation, redness and visual disturbance  ?Ears, nose, mouth, throat, and face: negative for earaches, epistaxis, nasal congestion and sore throat  ?Respiratory: negative for cough, dyspnea on exertion, sputum and wheezing  ?Cardiovascular: negative for chest pain, dyspnea, lower extremity edema, orthopnea, palpitations and syncope  ?Gastrointestinal: negative for  abdominal pain, constipation, diarrhea, melena, nausea and vomiting  ?Genitourinary:negative for dysuria, frequency and hematuria  ?Hematologic/lymphatic: negative for bleeding, easy bruising and lymphadenopathy  ?Musculoskeletal:negative for arthralgias, muscle weakness and stiff joints  ?Neurological: negative for coordination problems, gait problems, headaches and weakness  ?Endocrine: negative for diabetic symptoms including polydipsia, polyuria and weight loss ? ?   ?Objective:  ? Physical Exam ? ?Gen. Pleasant, obese, in no distress, normal affect ?ENT - no pallor,icterus, no post nasal drip, class 2 airway, long uvula, normal bite ?Neck: No JVD, no thyromegaly, no carotid bruits ?Lungs: no use of accessory muscles, no dullness to percussion, decreased without rales or rhonchi  ?Cardiovascular: Rhythm regular, heart sounds  normal, no murmurs or gallops, no peripheral edema ?Abdomen: soft and non-tender, no hepatosplenomegaly, BS normal. ?Musculoskeletal: No deformities, no cyanosis or clubbing ?Neuro:  alert, non focal, no tremors ? ? ? ?   ?Assessment & Plan:  ? ? ?

## 2021-09-30 NOTE — Patient Instructions (Signed)
?  X Home sleep study ?

## 2021-09-30 NOTE — Telephone Encounter (Signed)
LOV 07/22/21 ?Last refill 09/04/20, #120, 3 refills ? ?Please review, thanks! ? ?

## 2021-09-30 NOTE — Addendum Note (Signed)
Addended by: Fritzi Mandes D on: 09/30/2021 10:32 AM ? ? Modules accepted: Orders ? ?

## 2021-10-14 ENCOUNTER — Encounter: Payer: Self-pay | Admitting: Pulmonary Disease

## 2021-11-25 ENCOUNTER — Ambulatory Visit (INDEPENDENT_AMBULATORY_CARE_PROVIDER_SITE_OTHER): Payer: Medicare Other | Admitting: Family Medicine

## 2021-11-25 VITALS — BP 130/72 | HR 72 | Temp 98.7°F | Ht 74.0 in | Wt 241.0 lb

## 2021-11-25 DIAGNOSIS — K5909 Other constipation: Secondary | ICD-10-CM

## 2021-11-25 DIAGNOSIS — I10 Essential (primary) hypertension: Secondary | ICD-10-CM

## 2021-11-25 NOTE — Progress Notes (Signed)
Subjective:    Patient ID: George Pratt, male    DOB: 1961/01/12, 61 y.o.   MRN: [id number]   Patient is here today complaining of chronic constipation.  He states that he is taking MiraLAX 2-3 times a day with no success.  He states that he is having bowel movements but he never feels like he is completely empty.  He states that he always feels full and bloated.  He denies any nausea or vomiting.  He denies melena or hematochezia.  Blood pressure today is excellent for this patient at 130/72.  However he is long overdue for fasting lab work.  He had a colonoscopy in 2017 which did reveal a polyp.  His father I believe had colon cancer.  Therefore he is very concerned about colon cancer potentially contributing to his chronic constipation.  He would like to set up with another gastroenterologist. Past Medical History:  Diagnosis Date   Hypertension    Sleep apnea    Past Surgical History:  Procedure Laterality Date   APPENDECTOMY  2006   COLONOSCOPY N/A 10/01/2015   Procedure: COLONOSCOPY;  Surgeon: Rogene Houston, MD;  [facility];  Service: Endoscopy;  Laterality: N/A;  [place]   Current Outpatient Medications on File Prior to Visit  Medication Sig Dispense Refill   amLODipine (NORVASC) 10 MG tablet TAKE 1 TABLET BY MOUTH ONCE DAILY 90 tablet 3   aspirin EC 81 MG tablet Take 1 tablet (81 mg total) by mouth daily.     atorvastatin (LIPITOR) 40 MG tablet TAKE 1 TABLET BY MOUTH EVERY DAY 90 tablet 1   carvedilol (COREG) 6.25 MG tablet Take 1 tablet (6.25 mg total) by mouth 2 (two) times daily. 60 tablet 6   hydrALAZINE (APRESOLINE) 100 MG tablet Take 1 tablet (100 mg total) by mouth 3 (three) times daily. 270 tablet 3   levocetirizine (XYZAL) 5 MG tablet Take 1 tablet (5 mg total) by mouth every evening. 30 tablet 3   Multiple Vitamins-Minerals (ONE-A-DAY MENS 50+ ADVANTAGE PO) Take 1 tablet by mouth daily.     topiramate (TOPAMAX) 25 MG  tablet TAKE TWO TABLETS BY MOUTH TWICE DAILY (FOR weight loss) 120 tablet 3   traMADol (ULTRAM) 50 MG tablet Take 1 tablet (50 mg total) by mouth every 6 (six) hours as needed. 15 tablet 0   valsartan (DIOVAN) 320 MG tablet Take 1 tablet (320 mg total) by mouth daily. Patient should stop lisinopril 90 tablet 3   No current facility-administered medications on file prior to visit.   Allergies  Allergen Reactions   Bee Venom Swelling   Lisinopril Cough   Social History   Socioeconomic History   Marital status: Married    Spouse name: Not on file   Number of children: Not on file   Years of education: Not on file   Highest education level: Not on file  Occupational History   Not on file  Tobacco Use   Smoking status: Never   Smokeless tobacco: Never  Vaping Use   Vaping Use: Never used  Substance and Sexual Activity   Alcohol use: No    Alcohol/week: 0.0 standard drinks of alcohol   Drug use: No   Sexual activity: Yes  Other Topics Concern   Not on file  Social History Narrative   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   Social Determinants of Health   Financial Resource Strain: Not on file  Food Insecurity: Not on file  Transportation Needs: Not on file  Physical Activity: Not on file  Stress: Not on file  Social Connections: Not on file  Intimate Partner Violence: Not on file    Review of Systems  Respiratory:  Positive for cough.   All other systems reviewed and are negative.      Objective:   Physical Exam Vitals reviewed.  Constitutional:      General: He is not in acute distress.    Appearance: Normal appearance. He is normal weight. He is not ill-appearing, toxic-appearing or diaphoretic.  Cardiovascular:     Rate and Rhythm: Normal rate and regular rhythm.     Heart sounds:  Normal heart sounds. No murmur heard.    No friction rub.  Pulmonary:     Effort: Pulmonary effort is normal. No respiratory distress.     Breath sounds: Normal breath sounds. No stridor. No wheezing, rhonchi or rales.  Abdominal:     General: Bowel sounds are normal. There is no distension.     Palpations: Abdomen is soft.  Neurological:     Mental Status: He is alert.           Assessment & Plan:  Essential hypertension - Plan: CBC with Differential/Platelet, Lipid panel, COMPLETE METABOLIC PANEL WITH GFR  Chronic constipation - Plan: Ambulatory referral to Gastroenterology Recommended the patient try different stool softener.  In the past he has tried Linzess with no success.  I recommended trying milk of magnesia.  He can buy this over-the-counter.  See if Milk of Magnesia works more effectively than MiraLAX.  If not he can add a stimulant to MiraLAX such as senna or Dulcolax to facilitate evacuation.  If necessary, he could occasionally use magnesium citrate however I recommended against this unless he has gone multiple days without having a bowel movement.  All the patient is here today, I would like to check fasting lab work including a CBC CMP and lipid panel.

## 2021-11-26 LAB — CBC WITH DIFFERENTIAL/PLATELET
Absolute Monocytes: 631 cells/uL (ref 200–950)
Basophils Absolute: 58 cells/uL (ref 0–200)
Basophils Relative: 0.7 %
Eosinophils Absolute: 473 cells/uL (ref 15–500)
Eosinophils Relative: 5.7 %
HCT: 44.8 % (ref 38.5–50.0)
Hemoglobin: 15.3 g/dL (ref 13.2–17.1)
Lymphs Abs: 1511 cells/uL (ref 850–3900)
MCH: 29.9 pg (ref 27.0–33.0)
MCHC: 34.2 g/dL (ref 32.0–36.0)
MCV: 87.7 fL (ref 80.0–100.0)
MPV: 10.3 fL (ref 7.5–12.5)
Monocytes Relative: 7.6 %
Neutro Abs: 5627 cells/uL (ref 1500–7800)
Neutrophils Relative %: 67.8 %
Platelets: 260 10*3/uL (ref 140–400)
RBC: 5.11 10*6/uL (ref 4.20–5.80)
RDW: 12.7 % (ref 11.0–15.0)
Total Lymphocyte: 18.2 %
WBC: 8.3 10*3/uL (ref 3.8–10.8)

## 2021-11-26 LAB — COMPLETE METABOLIC PANEL WITH GFR
AG Ratio: 3.1 (calc) — ABNORMAL HIGH (ref 1.0–2.5)
ALT: 12 U/L (ref 9–46)
AST: 12 U/L (ref 10–35)
Albumin: 4.6 g/dL (ref 3.6–5.1)
Alkaline phosphatase (APISO): 102 U/L (ref 35–144)
BUN: 13 mg/dL (ref 7–25)
CO2: 22 mmol/L (ref 20–32)
Calcium: 8.9 mg/dL (ref 8.6–10.3)
Chloride: 112 mmol/L — ABNORMAL HIGH (ref 98–110)
Creat: 1.05 mg/dL (ref 0.70–1.35)
Globulin: 1.5 g/dL (calc) — ABNORMAL LOW (ref 1.9–3.7)
Glucose, Bld: 106 mg/dL — ABNORMAL HIGH (ref 65–99)
Potassium: 4.1 mmol/L (ref 3.5–5.3)
Sodium: 142 mmol/L (ref 135–146)
Total Bilirubin: 0.6 mg/dL (ref 0.2–1.2)
Total Protein: 6.1 g/dL (ref 6.1–8.1)
eGFR: 80 mL/min/{1.73_m2} (ref >=60)

## 2021-11-26 LAB — LIPID PANEL
Cholesterol: 101 mg/dL (ref <200)
HDL: 34 mg/dL — ABNORMAL LOW (ref >=40)
LDL Cholesterol (Calc): 51 mg/dL (calc)
Non-HDL Cholesterol (Calc): 67 mg/dL (calc) (ref <130)
Total CHOL/HDL Ratio: 3 (calc) (ref <5.0)
Triglycerides: 79 mg/dL (ref <150)

## 2021-12-02 ENCOUNTER — Other Ambulatory Visit: Payer: Self-pay | Admitting: Cardiology

## 2021-12-02 ENCOUNTER — Encounter (INDEPENDENT_AMBULATORY_CARE_PROVIDER_SITE_OTHER): Payer: Self-pay | Admitting: *Deleted

## 2022-01-13 ENCOUNTER — Encounter (INDEPENDENT_AMBULATORY_CARE_PROVIDER_SITE_OTHER): Payer: Self-pay | Admitting: Gastroenterology

## 2022-01-13 ENCOUNTER — Ambulatory Visit (INDEPENDENT_AMBULATORY_CARE_PROVIDER_SITE_OTHER): Payer: Medicare Other | Admitting: Gastroenterology

## 2022-01-13 DIAGNOSIS — K589 Irritable bowel syndrome without diarrhea: Secondary | ICD-10-CM | POA: Insufficient documentation

## 2022-01-13 DIAGNOSIS — K581 Irritable bowel syndrome with constipation: Secondary | ICD-10-CM | POA: Diagnosis not present

## 2022-01-13 MED ORDER — LINACLOTIDE 145 MCG PO CAPS: 145.0000 ug | ORAL_CAPSULE | Freq: Every day | ORAL | 1 refills | Status: DC

## 2022-01-13 NOTE — Progress Notes (Signed)
Maylon Peppers, M.D. Gastroenterology & Hepatology [facility] [address] [place], [place] [address] Primary Care Physician: Susy Frizzle, MD [address] [place] [address]  Referring MD: PCP  Chief Complaint: Constipation  History of Present Illness: George Pratt is a 61 y.o. male with past medical history of OSA, hyperlipidemia and hypertension, who presents for evaluation of constipation.  Patient states that for at least the last 10 years he has presented recurrent episodes of constipation and bloating. States that for the last year and a half his symptoms have worsened even more. He currently has a BM every week. He has been using Miralax 2 capfuls every day but he takes Dulcolax or milk of magnesia on the weekend to relieve the bloating, abdominal pain and constipation.  He has not taken his oral laxative during the weekday as this may affect his activities at his job.  He reports that his PCP prescribed Linzess in the past. It seems he had diarrhea with the 145 mcg dosing but he felt the 72 mcg dosing did not really help so he stopped taking his medication.  He is currently taking amlodipine and tramadol occasionally.  The patient denies having any nausea, vomiting, fever, chills, hematochezia, melena, hematemesis, abdominal pain, diarrhea, jaundice, pruritus or weight loss.  Most recent CMP from 11/25/2021 showed normal electrolytes, creatinine was 1.05, normal liver function.  Most recent TSH from October 2018 was normal 2.3.  Last JOI:NOMVE Last Colonoscopy:10/01/2015 A 6 mm polyp was found in the ascending colon. The polyp was sessile. The polyp was removed with a cold snare. Resection and retrieval were complete. To stop active bleeding, one hemostatic clip was successfully placed (MR conditional). There was no bleeding at the end of the procedure. A few small-mouthed diverticula were found in the sigmoid  colon. External hemorrhoids were found during retroflexion. The hemorrhoids were small.  Path: tubular adenoma  Recommended repeat in 7 years  FHx: neg for any gastrointestinal/liver disease, no malignancies Social: neg smoking, alcohol or illicit drug use Surgical: appendectomy  Past Medical History: Past Medical History:  Diagnosis Date   Hyperlipemia    Hypertension    Sleep apnea     Past Surgical History: Past Surgical History:  Procedure Laterality Date   APPENDECTOMY  2006   COLONOSCOPY N/A 10/01/2015   Procedure: COLONOSCOPY;  Surgeon: Rogene Houston, MD;  [facility];  Service: Endoscopy;  Laterality: N/A;  1030   KNEE SURGERY     NECK SURGERY  2000    Family History: Family History  Problem Relation Age of Onset   Hypertension Mother    Heart failure Mother    Heart disease Mother    Hypertension Father    Diabetes Father    Vision loss Father    Hypertension Brother    Hypertension Brother    Hypertension Brother     Social History: Social History   Tobacco Use  Smoking Status Never   Passive exposure: Never  Smokeless Tobacco Never   Social History   Substance and Sexual Activity  Alcohol Use No   Alcohol/week: 0.0 standard drinks of alcohol   Social History   Substance and Sexual Activity  Drug Use No    Allergies: Allergies  Allergen Reactions   Bee Venom Swelling   Lisinopril Cough    Medications: Current Outpatient Medications  Medication Sig Dispense Refill   amLODipine (NORVASC) 10 MG tablet TAKE 1 TABLET  BY MOUTH ONCE DAILY 90 tablet 3   aspirin EC 81 MG tablet Take 1 tablet (81 mg total) by mouth daily.     atorvastatin (LIPITOR) 40 MG tablet TAKE 1 TABLET BY MOUTH EVERY DAY 90 tablet 1   carvedilol (COREG) 6.25 MG tablet TAKE 1 TABLET BY MOUTH TWICE DAILY 180 tablet 1   hydrALAZINE (APRESOLINE) 100 MG tablet Take 1 tablet (100 mg total) by mouth 3 (three) times daily. 270 tablet 3   levocetirizine (XYZAL) 5  MG tablet Take 1 tablet (5 mg total) by mouth every evening. 30 tablet 3   Multiple Vitamins-Minerals (ONE-A-DAY MENS 50+ ADVANTAGE PO) Take 1 tablet by mouth daily.     topiramate (TOPAMAX) 25 MG tablet TAKE TWO TABLETS BY MOUTH TWICE DAILY (FOR weight loss) 120 tablet 3   traMADol (ULTRAM) 50 MG tablet Take 1 tablet (50 mg total) by mouth every 6 (six) hours as needed. 15 tablet 0   valsartan (DIOVAN) 320 MG tablet Take 1 tablet (320 mg total) by mouth daily. Patient should stop lisinopril 90 tablet 3   No current facility-administered medications for this visit.    Review of Systems: GENERAL: negative for malaise, night sweats HEENT: No changes in hearing or vision, no nose bleeds or other nasal problems. NECK: Negative for lumps, goiter, pain and significant neck swelling RESPIRATORY: Negative for cough, wheezing CARDIOVASCULAR: Negative for chest pain, leg swelling, palpitations, orthopnea GI: SEE HPI MUSCULOSKELETAL: Negative for joint pain or swelling, back pain, and muscle pain. SKIN: Negative for lesions, rash PSYCH: Negative for sleep disturbance, mood disorder and recent psychosocial stressors. HEMATOLOGY Negative for prolonged bleeding, bruising easily, and swollen nodes. ENDOCRINE: Negative for cold or heat intolerance, polyuria, polydipsia and goiter. NEURO: negative for tremor, gait imbalance, syncope and seizures. The remainder of the review of systems is noncontributory.   Physical Exam: BP (!) 156/81 (BP Location: Left Arm, Patient Position: Sitting, Cuff Size: Large)   Pulse 63   Temp 98.6 F (37 C) (Oral)   Ht '6\' 2"'$  (1.88 m)   Wt 237 lb 14.4 oz (107.9 kg)   BMI 30.54 kg/m  GENERAL: The patient is AO x3, in no acute distress. HEENT: Head is normocephalic and atraumatic. EOMI are intact. Mouth is well hydrated and without lesions. NECK: Supple. No masses LUNGS: Clear to auscultation. No presence of rhonchi/wheezing/rales. Adequate chest expansion HEART: RRR,  normal s1 and s2. ABDOMEN: Soft, nontender, no guarding, no peritoneal signs, and nondistended. BS +. No masses. EXTREMITIES: Without any cyanosis, clubbing, rash, lesions or edema. NEUROLOGIC: AOx3, no focal motor deficit. SKIN: no jaundice, no rashes   Imaging/Labs: as above  I personally reviewed and interpreted the available labs, imaging and endoscopic files.  Impression and Plan: George Pratt is a 61 y.o. male with past medical history of OSA, hyperlipidemia and hypertension, who presents for evaluation of constipation.  The patient has presented a chronic history of constipation and bloating which have now improved with over-the-counter laxatives such as MiraLAX.  His symptoms are consistent with IBS-C.  He had a colonoscopy less than 7 years ago which was not remarkable for any abnormalities that could explain his constipation.  It seems that he had some improvement of his symptoms with Linzess in the past at the medium dose which may also provide relief of his bloating.  We will try again this agent on a daily basis, he can consider taking it every other day if having too many bowel movements and a single  day.  -Start Linzess 145 mcg qday -Repeat colonoscopy in 2024.  All questions were answered.      Maylon Peppers, MD Gastroenterology and Hepatology [facility]

## 2022-01-13 NOTE — Patient Instructions (Signed)
Start Linzess 145 mcg qday Explained presumed etiology of IBS symptoms.

## 2022-01-27 ENCOUNTER — Encounter: Payer: Self-pay | Admitting: Cardiology

## 2022-01-27 ENCOUNTER — Ambulatory Visit: Payer: Medicare Other | Attending: Cardiology | Admitting: Cardiology

## 2022-01-27 VITALS — BP 132/70 | HR 62 | Ht 73.0 in | Wt 236.2 lb

## 2022-01-27 DIAGNOSIS — I77810 Thoracic aortic ectasia: Secondary | ICD-10-CM | POA: Diagnosis not present

## 2022-01-27 DIAGNOSIS — I10 Essential (primary) hypertension: Secondary | ICD-10-CM

## 2022-01-27 NOTE — Progress Notes (Signed)
Clinical Summary George Pratt is a 61 y.o.male seen today for follow up of the following medical problems.    1.HTN - long history of difficult to contorl HTN - valsartan started by pcp Jan 23,2023. Had been on lisinopril, cough has resolved off ACEi - previously low bps on clonidine - had been on labetalol, unclear when switched and indication -02/2014 chlortahldione started. Notes mention caused dry mouth, constipation, ED. Symptoms continued off chlorthaldione it appears  -home bp's 130s-140s/70s-80s - - recent MD visits bp 130s/70s  -09/2017 CTA patent renal arteries - he has OSA, followed by Dr Elsworth Soho with upcoming sleep study - renin/aldo levels I don't see have been chekced.            2.Aortic root dilatation - 09/2017 CTA aortic root 4 cm.  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  His father is in his 55's and is a retired Lexicographer who practiced for 28 years in [place].  His father lives with his family now.     Past Medical History:  Diagnosis Date   Hyperlipemia    Hypertension    Sleep apnea      Allergies  Allergen Reactions   Bee Venom Swelling   Lisinopril Cough     Current Outpatient Medications  Medication Sig Dispense Refill   amLODipine (NORVASC) 10 MG tablet TAKE 1 TABLET BY MOUTH ONCE DAILY 90 tablet 3   aspirin EC 81 MG tablet Take 1 tablet (81 mg total) by mouth daily.     atorvastatin (LIPITOR) 40 MG tablet TAKE 1 TABLET BY MOUTH EVERY DAY 90 tablet 1   carvedilol (COREG) 6.25 MG tablet TAKE 1 TABLET BY MOUTH TWICE DAILY 180 tablet 1   hydrALAZINE (APRESOLINE) 100 MG tablet Take 1 tablet (100 mg total) by mouth 3 (three) times daily. 270 tablet 3    levocetirizine (XYZAL) 5 MG tablet Take 1 tablet (5 mg total) by mouth every evening. 30 tablet 3   linaclotide (LINZESS) 145 MCG CAPS capsule Take 1 capsule (145 mcg total) by mouth daily. 90 capsule 1   Multiple Vitamins-Minerals (ONE-A-DAY MENS 50+ ADVANTAGE PO) Take 1 tablet by mouth daily.     topiramate (TOPAMAX) 25 MG tablet TAKE TWO TABLETS BY MOUTH TWICE DAILY (FOR weight loss) 120 tablet 3   traMADol (ULTRAM) 50 MG tablet Take 1 tablet (50 mg total) by mouth every 6 (six) hours as needed. 15 tablet 0   valsartan (DIOVAN) 320 MG tablet Take 1 tablet (320 mg total) by mouth daily. Patient should stop lisinopril 90 tablet 3   No current facility-administered medications for this visit.     Past Surgical History:  Procedure Laterality Date   APPENDECTOMY  2006   COLONOSCOPY N/A 10/01/2015   Procedure: COLONOSCOPY;  Surgeon: Rogene Houston, MD;  [facility];  Service: Endoscopy;  Laterality: N/A;  1030   KNEE SURGERY     NECK SURGERY  2000     Allergies  Allergen Reactions   Bee Venom Swelling   Lisinopril Cough      Family History  Problem Relation Age of Onset   Hypertension Mother  Heart failure Mother    Heart disease Mother    Hypertension Father    Diabetes Father    Vision loss Father    Hypertension Brother    Hypertension Brother    Hypertension Brother      Social History George Pratt reports that he has never smoked. He has never been exposed to tobacco smoke. He has never used smokeless tobacco. George Pratt reports no history of alcohol use.   Review of Systems CONSTITUTIONAL: No weight loss, fever, chills, weakness or fatigue.  HEENT: Eyes: No visual loss, blurred vision, double vision or yellow sclerae.No hearing loss, sneezing, congestion, runny nose or sore throat.  SKIN: No rash or itching.  CARDIOVASCULAR: per hpi RESPIRATORY: No shortness of breath, cough or sputum.  GASTROINTESTINAL: No anorexia, nausea, vomiting or diarrhea. No  abdominal pain or blood.  GENITOURINARY: No burning on urination, no polyuria NEUROLOGICAL: No headache, dizziness, syncope, paralysis, ataxia, numbness or tingling in the extremities. No change in bowel or bladder control.  MUSCULOSKELETAL: No muscle, back pain, joint pain or stiffness.  LYMPHATICS: No enlarged nodes. No history of splenectomy.  PSYCHIATRIC: No history of depression or anxiety.  ENDOCRINOLOGIC: No reports of sweating, cold or heat intolerance. No polyuria or polydipsia.  Marland Kitchen   Physical Examination Today's Vitals   01/27/22 0841  BP: 132/70  Pulse: 62  SpO2: 98%  Weight: 236 lb 3.2 oz (107.1 kg)  Height: '6\' 1"'$  (1.854 m)   Body mass index is 31.16 kg/m.  Gen: resting comfortably, no acute distress HEENT: no scleral icterus, pupils equal round and reactive, no palptable cervical adenopathy,  CV: RRR, no mr/g no jvd Resp: Clear to auscultation bilaterally GI: abdomen is soft, non-tender, non-distended, normal bowel sounds, no hepatosplenomegaly MSK: extremities are warm, no edema.  Skin: warm, no rash Neuro:  no focal deficits Psych: appropriate affect   Diagnostic Studies  10/2017 nuclear stress Blood pressure demonstrated a hypertensive response to exercise. There was no ST segment deviation noted during stress. Defect 1: There is a medium defect of mild severity present in the mid inferoseptal and apical inferior location. Images are improved on stress when compared to rest (reverse redistribution). Hence, findings appear to be due to soft tissue attenuation. No significant ischemic zones. Regional wall motion is normal. This is a low risk study. Nuclear stress EF: 61%.   07/2021 echo 1. Left ventricular ejection fraction, by estimation, is 55 to 60%. The  left ventricle has normal function. The left ventricle has no regional  wall motion abnormalities. Left ventricular diastolic parameters are  indeterminate. The average left  ventricular global  longitudinal strain is -18.7 %. The global longitudinal  strain is normal.   2. Right ventricular systolic function is normal. The right ventricular  size is normal. Tricuspid regurgitation signal is inadequate for assessing  PA pressure.   3. The mitral valve is normal in structure. No evidence of mitral valve  regurgitation. No evidence of mitral stenosis.   4. The aortic valve is tricuspid. Aortic valve regurgitation is trivial.  No aortic stenosis is present.   5. Aortic dilatation noted. There is mild dilatation of the aortic root,  measuring 41 mm. There is mild dilatation of the ascending aorta,  measuring 38 mm.   6. The inferior vena cava is normal in size with greater than 50%  respiratory variability, suggesting right atrial pressure of 3 mmHg.   Assessment and Plan   HTN -historically difficult to control bp - medication side effects  as listed above - currently at goal, continue current meds - given aggressive bp check renin/aldo levels and ratio. No evidence renal artery stenosis by prior CTA, he does have OSA being further evaluated by pulmonary - if additional agent needed would start aldactone      2. Aortic root dilatation - mild and stable, continue to monitor.    F/u 1 year   Arnoldo Lenis, M.D

## 2022-01-27 NOTE — Patient Instructions (Addendum)
Medication Instructions:  Your physician recommends that you continue on your current medications as directed. Please refer to the Current Medication list given to you today.  Labwork: Renin/adosterone ratio Non-fasting [facility]  Testing/Procedures: none  Follow-Up: Your physician recommends that you schedule a follow-up appointment in: 1 year. You will receive a reminder call in the mail in about 10 months reminding you to call and schedule your appointment. If you don't receive this call, please contact our office.  Any Other Special Instructions Will Be Listed Below (If Applicable).  If you need a refill on your cardiac medications before your next appointment, please call your pharmacy.

## 2022-01-27 NOTE — Addendum Note (Signed)
Addended by: Merlene Laughter on: 01/27/2022 09:12 AM   Modules accepted: Orders

## 2022-02-08 LAB — ALDOSTERONE + RENIN ACTIVITY W/ RATIO
ALDO / PRA Ratio: 16.7 Ratio (ref 0.9–28.9)
Aldosterone: 6 ng/dL
Renin Activity: 0.36 ng/mL/h (ref 0.25–5.82)

## 2022-02-16 ENCOUNTER — Ambulatory Visit: Payer: Medicare Other

## 2022-02-16 DIAGNOSIS — G4733 Obstructive sleep apnea (adult) (pediatric): Secondary | ICD-10-CM

## 2022-02-21 ENCOUNTER — Encounter: Payer: Self-pay | Admitting: Pulmonary Disease

## 2022-02-21 ENCOUNTER — Telehealth: Payer: Self-pay | Admitting: Pulmonary Disease

## 2022-02-28 ENCOUNTER — Other Ambulatory Visit: Payer: Self-pay | Admitting: Cardiology

## 2022-02-28 ENCOUNTER — Other Ambulatory Visit: Payer: Self-pay | Admitting: Family Medicine

## 2022-03-02 ENCOUNTER — Encounter: Payer: Self-pay | Admitting: *Deleted

## 2022-03-03 DIAGNOSIS — G4733 Obstructive sleep apnea (adult) (pediatric): Secondary | ICD-10-CM | POA: Diagnosis not present

## 2022-03-16 NOTE — Telephone Encounter (Signed)
Patient completed HST at later date

## 2022-03-17 ENCOUNTER — Telehealth: Payer: Self-pay | Admitting: Pulmonary Disease

## 2022-03-17 DIAGNOSIS — G4733 Obstructive sleep apnea (adult) (pediatric): Secondary | ICD-10-CM

## 2022-03-17 NOTE — Telephone Encounter (Signed)
ATC patient.  LMTCB. 

## 2022-03-17 NOTE — Telephone Encounter (Signed)
HST showed severe  OSA with AHI 34/ hr , low sat 86%  He should continue on his autoCPAP  OV with me next available

## 2022-03-21 NOTE — Telephone Encounter (Signed)
Spoke with patient regarding HST results. They verbalized understanding. No further questions.Scheduled an ov for patient in [facility] office next available (December 21)  Nothing further needed at this time.

## 2022-04-18 ENCOUNTER — Encounter (INDEPENDENT_AMBULATORY_CARE_PROVIDER_SITE_OTHER): Payer: Self-pay | Admitting: Gastroenterology

## 2022-04-18 ENCOUNTER — Ambulatory Visit (INDEPENDENT_AMBULATORY_CARE_PROVIDER_SITE_OTHER): Payer: Medicare Other | Admitting: Gastroenterology

## 2022-04-18 VITALS — BP 150/82 | HR 86 | Temp 97.3°F | Ht 73.0 in | Wt 240.2 lb

## 2022-04-18 DIAGNOSIS — K581 Irritable bowel syndrome with constipation: Secondary | ICD-10-CM

## 2022-04-18 NOTE — Progress Notes (Signed)
George Pratt, M.D. Gastroenterology & Hepatology [facility] [address] [place], [place] [address]  Primary Care Physician: Susy Frizzle, MD [address] [place] [address]  I will communicate my assessment and recommendations to the referring MD via EMR.  Problems: IBS-C  History of Present Illness: George Pratt is a 61 y.o. male with past medical history of OSA, hyperlipidemia and hypertension, who presents for follow up of IBS-C.  The patient was last seen on 01/13/2022. At that time, the patient was advised to start Linzess 145 mcg every day.  He reports that every 2-3 days he has a bowel movement, which is usually watery in nature but he states that he may have multiple bowel movements that day. Does not really strain. He has a lot of bloating and abdominal discomfort in the days he is not moving his bowels, which improves when he eventually moves his bowels.  The patient denies having any nausea, vomiting, fever, chills, hematochezia, melena, hematemesis, jaundice, pruritus. Has gained some weight, believes possibly due to the bloating.  Last GGY:IRSWN Last Colonoscopy:10/01/2015 A 6 mm polyp was found in the ascending colon. The polyp was sessile. The polyp was removed with a cold snare. Resection and retrieval were complete. To stop active bleeding, one hemostatic clip was successfully placed (MR conditional). There was no bleeding at the end of the procedure. A few small-mouthed diverticula were found in the sigmoid colon. External hemorrhoids were found during retroflexion. The hemorrhoids were small.   Path: tubular adenoma   Recommended repeat in 7 years  Past Medical History: Past Medical History:  Diagnosis Date   Hyperlipemia    Hypertension    Sleep apnea     Past Surgical History: Past Surgical History:  Procedure Laterality Date   APPENDECTOMY  2006   COLONOSCOPY N/A 10/01/2015   Procedure:  COLONOSCOPY;  Surgeon: Rogene Houston, MD;  [facility];  Service: Endoscopy;  Laterality: N/A;  1030   KNEE SURGERY     NECK SURGERY  2000    Family History: Family History  Problem Relation Age of Onset   Hypertension Mother    Heart failure Mother    Heart disease Mother    Hypertension Father    Diabetes Father    Vision loss Father    Hypertension Brother    Hypertension Brother    Hypertension Brother     Social History: Social History   Tobacco Use  Smoking Status Never   Passive exposure: Never  Smokeless Tobacco Never   Social History   Substance and Sexual Activity  Alcohol Use No   Alcohol/week: 0.0 standard drinks of alcohol   Social History   Substance and Sexual Activity  Drug Use No    Allergies: Allergies  Allergen Reactions   Bee Venom Swelling   Lisinopril Cough    Medications: Current Outpatient Medications  Medication Sig Dispense Refill   amLODipine (NORVASC) 10 MG tablet TAKE 1 TABLET BY MOUTH DAILY 90 tablet 3   aspirin EC 81 MG tablet Take 1 tablet (81 mg total) by mouth daily.     atorvastatin (LIPITOR) 40 MG tablet TAKE 1 TABLET BY MOUTH EVERY DAY 90 tablet 1   carvedilol (COREG) 6.25 MG tablet TAKE 1 TABLET BY MOUTH TWICE DAILY 180 tablet 1   hydrALAZINE (APRESOLINE) 100 MG tablet Take 1 tablet (100 mg total) by mouth 3 (three) times daily. 270 tablet 3   levocetirizine (  XYZAL) 5 MG tablet Take 1 tablet (5 mg total) by mouth every evening. 30 tablet 3   linaclotide (LINZESS) 145 MCG CAPS capsule Take 1 capsule (145 mcg total) by mouth daily. 90 capsule 1   Multiple Vitamins-Minerals (ONE-A-DAY MENS 50+ ADVANTAGE PO) Take 1 tablet by mouth daily.     topiramate (TOPAMAX) 25 MG tablet TAKE TWO TABLETS BY MOUTH TWICE DAILY (FOR weight loss) 120 tablet 3   traMADol (ULTRAM) 50 MG tablet Take 1 tablet (50 mg total) by mouth every 6 (six) hours as needed. 15 tablet 0   valsartan (DIOVAN) 320 MG tablet Take 1 tablet (320 mg  total) by mouth daily. Patient should stop lisinopril 90 tablet 3   No current facility-administered medications for this visit.    Review of Systems: GENERAL: negative for malaise, night sweats HEENT: No changes in hearing or vision, no nose bleeds or other nasal problems. NECK: Negative for lumps, goiter, pain and significant neck swelling RESPIRATORY: Negative for cough, wheezing CARDIOVASCULAR: Negative for chest pain, leg swelling, palpitations, orthopnea GI: SEE HPI MUSCULOSKELETAL: Negative for joint pain or swelling, back pain, and muscle pain. SKIN: Negative for lesions, rash PSYCH: Negative for sleep disturbance, mood disorder and recent psychosocial stressors. HEMATOLOGY Negative for prolonged bleeding, bruising easily, and swollen nodes. ENDOCRINE: Negative for cold or heat intolerance, polyuria, polydipsia and goiter. NEURO: negative for tremor, gait imbalance, syncope and seizures. The remainder of the review of systems is noncontributory.   Physical Exam: BP (!) 150/82 (BP Location: Left Arm, Patient Position: Sitting, Cuff Size: Large)   Pulse 86   Temp (!) 97.3 F (36.3 C) (Temporal)   Ht '6\' 1"'$  (1.854 m)   Wt 240 lb 3.2 oz (109 kg)   BMI 31.69 kg/m  GENERAL: The patient is AO x3, in no acute distress. HEENT: Head is normocephalic and atraumatic. EOMI are intact. Mouth is well hydrated and without lesions. NECK: Supple. No masses LUNGS: Clear to auscultation. No presence of rhonchi/wheezing/rales. Adequate chest expansion HEART: RRR, normal s1 and s2. ABDOMEN: Soft, nontender, no guarding, no peritoneal signs, and nondistended. BS +. No masses. EXTREMITIES: Without any cyanosis, clubbing, rash, lesions or edema. NEUROLOGIC: AOx3, no focal motor deficit. SKIN: no jaundice, no rashes  Imaging/Labs: as above  I personally reviewed and interpreted the available labs, imaging and endoscopic files.  Impression and Plan: George Pratt is a 61 y.o. male with past  medical history of OSA, hyperlipidemia and hypertension, who presents for follow up of IBS-C.  The patient has presented persistent constipation with very mild improvement of his symptoms while taking Linzess at mid dose.  He is concerned about having watery bowel movements but I explained to him that due to the mechanism of action of Linzess, stools may remain watery.  However, the goal would be to have more regular bowel movements which he has not achieved yet.  Due to this, we will attempt increasing the dose of Linzess to 90 mcg every day.  He was given ample explanation about the strategy to decrease to 145 mcg every other day and to 90 mcg every other day if he has severe diarrhea.  He will also benefit from implementing dietary changes and taking IBgard as needed to improve his abdominal bloating.  The patient was found to have elevated blood pressure when vital signs were checked in the office. The blood pressure was rechecked by the nursing staff and it was found be persistently elevated >140/90 mmHg. I personally advised to  the patient to follow up closely with his PCP for hypertension control.  - Increase Linzess to 290 mcg qday. If having severe diarrhea (>4 bowel movements every day), can switch to 145 mcg every other day and 290 mcg every other day - Can take IBGard as needed for persistent bloating - Eat prune and/or kiwi daily  All questions were answered.      George Peppers, MD Gastroenterology and Hepatology [facility]

## 2022-04-18 NOTE — Patient Instructions (Addendum)
Increase Linzess to 290 mcg qday. If having severe diarrhea (>4 bowel movements every day), can switch to 145 mcg every other day and 290 mcg every other day Can take IBGard as needed for persistent bloating Eat prune and/or kiwi daily The patient was found to have elevated blood pressure when vital signs were checked in the office. The blood pressure was rechecked by the nursing staff and it was found be persistently elevated >140/90 mmHg. I personally advised to the patient to follow up closely with his PCP for hypertension control.

## 2022-05-18 ENCOUNTER — Encounter: Payer: Self-pay | Admitting: Pulmonary Disease

## 2022-05-18 ENCOUNTER — Ambulatory Visit: Payer: Medicare Other | Admitting: Pulmonary Disease

## 2022-05-18 VITALS — BP 134/80 | HR 82 | Temp 97.6°F | Ht 73.0 in | Wt 240.6 lb

## 2022-05-18 DIAGNOSIS — G4733 Obstructive sleep apnea (adult) (pediatric): Secondary | ICD-10-CM

## 2022-05-18 NOTE — Patient Instructions (Signed)
  X Rx for CPAP supplies to [facility]  - airfit P10 nasal pillows, hose, filters

## 2022-05-18 NOTE — Progress Notes (Signed)
   Subjective:    Patient ID: George Pratt, male    DOB: [birth year], 61 y.o.   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 We had given him the results in October but he has not started back on CPAP yet.  Reports nonrefreshing sleep. Blood pressures controlled today  Significant tests/ events reviewed 09/2017 HST (apnealink ) - wt 232 lbs - AHI 9/h, lowest desat 87%   02/2022 HST -240 lbs -showed severe  OSA with AHI 34/ hr , low sat 86%    Review of Systems neg for any significant sore throat, dysphagia, itching, sneezing, nasal congestion or excess/ purulent secretions, fever, chills, sweats, unintended wt loss, pleuritic or exertional cp, hempoptysis, orthopnea pnd or change in chronic leg swelling. Also denies presyncope, palpitations, heartburn, abdominal pain, nausea, vomiting, diarrhea or change in bowel or urinary habits, dysuria,hematuria, rash, arthralgias, visual complaints, headache, numbness weakness or ataxia.     Objective:   Physical Exam   Gen. Pleasant, obese, in no distress ENT - no lesions, no post nasal drip, normal bite Neck: No JVD, no thyromegaly, no carotid bruits Lungs: no use of accessory muscles, no dullness to percussion, decreased without rales or rhonchi  Cardiovascular: Rhythm regular, heart sounds  normal, no murmurs or gallops, no peripheral edema Musculoskeletal: No deformities, no cyanosis or clubbing , no tremors        Assessment & Plan:

## 2022-05-18 NOTE — Assessment & Plan Note (Signed)
He still has his auto CPAP machine.  We will send in a prescription for new supplies to new DME.  He prefers Teacher, adult education.  He will need CPAP hose, filters and we reviewed different types of masks.  He  prefers to see AirFit P10 nasal pillows  Weight loss encouraged, compliance with goal of at least 4-6 hrs every night is the expectation. Advised against medications with sedative side effects Cautioned against driving when sleepy - understanding that sleepiness will vary on a day to day basis

## 2022-05-29 ENCOUNTER — Other Ambulatory Visit: Payer: Self-pay | Admitting: Family Medicine

## 2022-05-29 ENCOUNTER — Other Ambulatory Visit: Payer: Self-pay | Admitting: Cardiology

## 2022-05-31 NOTE — Telephone Encounter (Signed)
Requested Prescriptions  Pending Prescriptions Disp Refills   valsartan (DIOVAN) 320 MG tablet [Pharmacy Med Name: valsartan 320 mg tablet] 90 tablet 0    Sig: TAKE 1 TABLET BY MOUTH EVERY DAY - STOP LISINOPRIL     Cardiovascular:  Angiotensin Receptor Blockers Failed - 05/29/2022  8:04 AM      Failed - Cr in normal range and within 180 days    Creat  Date Value Ref Range Status  11/25/2021 1.05 0.70 - 1.35 mg/dL Final         Failed - K in normal range and within 180 days    Potassium  Date Value Ref Range Status  11/25/2021 4.1 3.5 - 5.3 mmol/L Final         Failed - Valid encounter within last 6 months    Recent Outpatient Visits           10 months ago Essential hypertension   [place], Warren T, MD   11 months ago Chronic cough   [place] Dennard Schaumann, Cammie Mcgee, MD   1 year ago Inflamed skin tag   [place] Dennard Schaumann, Cammie Mcgee, MD   2 years ago [place] Dennard Schaumann, Cammie Mcgee, MD   2 years ago Need for immunization against influenza   [place], Cammie Mcgee, MD              Passed - Patient is not pregnant      Passed - Last BP in normal range    BP Readings from Last 1 Encounters:  05/18/22 134/80

## 2022-06-30 ENCOUNTER — Other Ambulatory Visit: Payer: Self-pay | Admitting: Family Medicine

## 2022-07-14 ENCOUNTER — Other Ambulatory Visit: Payer: Self-pay | Admitting: Family Medicine

## 2022-07-14 DIAGNOSIS — M545 Low back pain, unspecified: Secondary | ICD-10-CM

## 2022-07-14 NOTE — Telephone Encounter (Signed)
Requested Prescriptions  Pending Prescriptions Disp Refills   baclofen (LIORESAL) 10 MG tablet [Pharmacy Med Name: baclofen 10 mg tablet] 90 tablet 0    Sig: TAKE 1 TABLET BY MOUTH AS NEEDED FOR muscle SPASMS     Analgesics:  Muscle Relaxants - baclofen Failed - 07/14/2022  7:08 AM      Failed - Cr in normal range and within 180 days    Creat  Date Value Ref Range Status  11/25/2021 1.05 0.70 - 1.35 mg/dL Final         Failed - eGFR is 30 or above and within 180 days    GFR, Est African American  Date Value Ref Range Status  04/14/2020 84 > OR = 60 mL/min/1.37m Final   GFR, Est Non African American  Date Value Ref Range Status  04/14/2020 72 > OR = 60 mL/min/1.719mFinal   eGFR  Date Value Ref Range Status  11/25/2021 80 > OR = 60 mL/min/1.7318minal    Comment:    The eGFR is based on the CKD-EPI 2021 equation. To calculate  the new eGFR from a previous Creatinine or Cystatin C result, go to https://www.kidney.org/professionals/ kdoqi/gfr%5Fcalculator          Failed - Valid encounter within last 6 months    Recent Outpatient Visits           11 months ago Essential hypertension   [place]   1 year ago Chronic cough   [place] Mcge[place]   1 year ago Inflamed skin tag   [place]   2 years ago [place]   2 years ago Need for immunization against influenza   [place]

## 2022-08-15 ENCOUNTER — Encounter (INDEPENDENT_AMBULATORY_CARE_PROVIDER_SITE_OTHER): Payer: Self-pay | Admitting: Gastroenterology

## 2022-08-15 ENCOUNTER — Ambulatory Visit (INDEPENDENT_AMBULATORY_CARE_PROVIDER_SITE_OTHER): Payer: Medicare Other | Admitting: Gastroenterology

## 2022-08-15 VITALS — BP 149/75 | HR 71 | Temp 97.3°F | Ht 73.0 in | Wt 243.0 lb

## 2022-08-15 DIAGNOSIS — K5909 Other constipation: Secondary | ICD-10-CM

## 2022-08-15 NOTE — Patient Instructions (Addendum)
Continue with healthy diet changes Continue Metamucil twice a day. Proceed with colonoscopy after May 2024

## 2022-08-15 NOTE — Progress Notes (Signed)
Maylon Peppers, M.D. Gastroenterology & Hepatology [facility] [address] [place], [place] [address]  Primary Care Physician: Susy Frizzle, MD [address] [place] [address]  I will communicate my assessment and recommendations to the referring MD via EMR.  Problems: Chronic idiopathic constipation   History of Present Illness: George Pratt is a 62 y.o. male with past medical history of OSA, hyperlipidemia and hypertension, who presents for follow up of constipation.  The patient was last seen on 04/18/2022. At that time, the patient was advised to increase Linzess to 290 mcg daily and to take IBgard as needed for bloating episodes.  He was also advised to implement more fiber in his diet.  Patient reports that he has changes his diet significantly for the last few months - he is eating plenty of fruits and vegetables. He is avoiding starchy meals. He is not taking Linzess as with diet he is feeling much better - he is having a bowel movement 4-5  times a week - stools have a normal consistency. Denies having any bloating or abdominal pain. He s taking Metamucil BID.  The patient denies having any nausea, vomiting, fever, chills, hematochezia, melena, hematemesis, abdominal distention, abdominal pain, diarrhea, jaundice, pruritus . Has tried losing weight by keeping more active.  Last [id number] Last Colonoscopy:10/01/2015 A 6 mm polyp was found in the ascending colon. The polyp was sessile. The polyp was removed with a cold snare. Resection and retrieval were complete. To stop active bleeding, one hemostatic clip was successfully placed (MR conditional). There was no bleeding at the end of the procedure. A few small-mouthed diverticula were found in the sigmoid colon. External hemorrhoids were found during retroflexion. The hemorrhoids were small.   Path: tubular adenoma   Recommended repeat in 7 years  Past Medical  History: Past Medical History:  Diagnosis Date   Hyperlipemia    Hypertension    Sleep apnea     Past Surgical History: Past Surgical History:  Procedure Laterality Date   APPENDECTOMY  2006   COLONOSCOPY N/A 10/01/2015   Procedure: COLONOSCOPY;  Surgeon: Rogene Houston, MD;  [facility];  Service: Endoscopy;  Laterality: N/A;  1030   KNEE SURGERY     NECK SURGERY  2000    Family History: Family History  Problem Relation Age of Onset   Hypertension Mother    Heart failure Mother    Heart disease Mother    Hypertension Father    Diabetes Father    Vision loss Father    Hypertension Brother    Hypertension Brother    Hypertension Brother     Social History: Social History   Tobacco Use  Smoking Status Never   Passive exposure: Never  Smokeless Tobacco Never   Social History   Substance and Sexual Activity  Alcohol Use No   Alcohol/week: 0.0 standard drinks of alcohol   Social History   Substance and Sexual Activity  Drug Use No    Allergies: Allergies  Allergen Reactions   Bee Venom Swelling   Lisinopril Cough    Medications: Current Outpatient Medications  Medication Sig Dispense Refill   amLODipine (NORVASC) 10 MG tablet TAKE 1 TABLET BY MOUTH DAILY 90 tablet 3   aspirin EC 81 MG tablet Take 1 tablet (81 mg total) by mouth daily.     atorvastatin (LIPITOR) 40 MG tablet TAKE 1 TABLET BY MOUTH EVERY DAY 90 tablet 1  carvedilol (COREG) 6.25 MG tablet TAKE 1 TABLET BY MOUTH TWICE DAILY 180 tablet 1   hydrALAZINE (APRESOLINE) 100 MG tablet Take 1 tablet (100 mg total) by mouth 3 (three) times daily. 270 tablet 3   levocetirizine (XYZAL) 5 MG tablet Take 1 tablet (5 mg total) by mouth every evening. 30 tablet 3   Multiple Vitamins-Minerals (ONE-A-DAY MENS 50+ ADVANTAGE PO) Take 1 tablet by mouth daily.     topiramate (TOPAMAX) 25 MG tablet TAKE TWO TABLETS BY MOUTH TWICE DAILY (FOR weight loss) 120 tablet 3   traMADol (ULTRAM) 50 MG tablet  Take 1 tablet (50 mg total) by mouth every 6 (six) hours as needed. 15 tablet 0   valsartan (DIOVAN) 320 MG tablet TAKE 1 TABLET BY MOUTH EVERY DAY - STOP LISINOPRIL 90 tablet 0   linaclotide (LINZESS) 145 MCG CAPS capsule Take 1 capsule (145 mcg total) by mouth daily. (Patient not taking: Reported on 08/15/2022) 90 capsule 1   No current facility-administered medications for this visit.    Review of Systems: GENERAL: negative for malaise, night sweats HEENT: No changes in hearing or vision, no nose bleeds or other nasal problems. NECK: Negative for lumps, goiter, pain and significant neck swelling RESPIRATORY: Negative for cough, wheezing CARDIOVASCULAR: Negative for chest pain, leg swelling, palpitations, orthopnea GI: SEE HPI MUSCULOSKELETAL: Negative for joint pain or swelling, back pain, and muscle pain. SKIN: Negative for lesions, rash PSYCH: Negative for sleep disturbance, mood disorder and recent psychosocial stressors. HEMATOLOGY Negative for prolonged bleeding, bruising easily, and swollen nodes. ENDOCRINE: Negative for cold or heat intolerance, polyuria, polydipsia and goiter. NEURO: negative for tremor, gait imbalance, syncope and seizures. The remainder of the review of systems is noncontributory.   Physical Exam: BP (!) 149/75 (BP Location: Right Arm, Patient Position: Sitting, Cuff Size: Large)   Pulse 71   Temp (!) 97.3 F (36.3 C) (Oral)   Ht 6\' 1"  (1.854 m)   Wt 243 lb (110.2 kg)   BMI 32.06 kg/m  GENERAL: The patient is AO x3, in no acute distress. HEENT: Head is normocephalic and atraumatic. EOMI are intact. Mouth is well hydrated and without lesions. NECK: Supple. No masses LUNGS: Clear to auscultation. No presence of rhonchi/wheezing/rales. Adequate chest expansion HEART: RRR, normal s1 and s2. ABDOMEN: Soft, nontender, no guarding, no peritoneal signs, and nondistended. BS +. No masses. EXTREMITIES: Without any cyanosis, clubbing, rash, lesions or  edema. NEUROLOGIC: AOx3, no focal motor deficit. SKIN: no jaundice, no rashes  Imaging/Labs: as above  I personally reviewed and interpreted the available labs, imaging and endoscopic files.  Impression and Plan: George Pratt is a 62 y.o. male with past medical history of OSA, hyperlipidemia and hypertension, who presents for follow up of constipation.  Patient has presented significant improvement of his symptoms after implementing dietary changes and introducing more fiber supplements in his diet.  He has not required to use any laxatives and his symptoms have completely resolved.  I congratulated him regarding this change, as this helps his gastrointestinal system but also his healthy for his cardiovascular system.  He should continue with this changes for now.  Patient will be due for colorectal cancer screening with colonoscopy in May 2024.  He will call back to schedule an appointment at that time.  - Continue with healthy diet changes - Continue Metamucil twice a day. - Proceed with colonoscopy after May 2024  All questions were answered.      Maylon Peppers, MD Gastroenterology and Hepatology [facility]

## 2022-08-25 ENCOUNTER — Encounter: Payer: Self-pay | Admitting: Family Medicine

## 2022-08-25 ENCOUNTER — Ambulatory Visit (INDEPENDENT_AMBULATORY_CARE_PROVIDER_SITE_OTHER): Payer: Medicare Other | Admitting: Family Medicine

## 2022-08-25 VITALS — BP 150/72 | HR 69 | Temp 97.7°F | Ht 73.0 in | Wt 243.0 lb

## 2022-08-25 DIAGNOSIS — M545 Low back pain, unspecified: Secondary | ICD-10-CM | POA: Diagnosis not present

## 2022-08-25 DIAGNOSIS — I10 Essential (primary) hypertension: Secondary | ICD-10-CM | POA: Diagnosis not present

## 2022-08-25 DIAGNOSIS — I872 Venous insufficiency (chronic) (peripheral): Secondary | ICD-10-CM

## 2022-08-25 MED ORDER — BACLOFEN 10 MG PO TABS: 10.0000 mg | ORAL_TABLET | Freq: Every day | ORAL | 0 refills | Status: AC | PRN

## 2022-08-25 NOTE — Assessment & Plan Note (Signed)
Hyperpigmentation present on exam to both feet with ankle swelling and tingling in evenings relieved with rest and elevation. I encouraged him to use compression stockings. Return to office if symptoms persist or worsen.

## 2022-08-25 NOTE — Progress Notes (Signed)
Acute Office Visit  Subjective:     Patient ID: George Pratt, male    DOB: Jul 22, 1960, 62 y.o.   MRN: [id number]  Chief Complaint  Patient presents with   Follow-up    lvm to r/s //nc3/25Routine office visit     HPI Patient is in today for ankle swelling in afternoon, skin darkening on top of his feet, and tingling pain to both feet in evenings. This has been ongoing for several months. No new medications although he is on Amlodipine. He denies itching, redness or spreading of hyperpigmented skin. BP has been stable although SBP sustained in 140s-150s. He denies shortness of breath, chest pain, palpitations, recurrent headaches, or vision changes.  Review of Systems  All other systems reviewed and are negative.   Past Medical History:  Diagnosis Date   Hyperlipemia    Hypertension    Sleep apnea    Past Surgical History:  Procedure Laterality Date   APPENDECTOMY  2006   COLONOSCOPY N/A 10/01/2015   Procedure: COLONOSCOPY;  Surgeon: Rogene Houston, MD;  [facility];  Service: Endoscopy;  Laterality: N/A;  [place]   Current Outpatient Medications on File Prior to Visit  Medication Sig Dispense Refill   amLODipine (NORVASC) 10 MG tablet TAKE 1 TABLET BY MOUTH DAILY 90 tablet 3   aspirin EC 81 MG tablet Take 1 tablet (81 mg total) by mouth daily.     atorvastatin (LIPITOR) 40 MG tablet TAKE 1 TABLET BY MOUTH EVERY DAY 90 tablet 1   carvedilol (COREG) 6.25 MG tablet TAKE 1 TABLET BY MOUTH TWICE DAILY 180 tablet 1   hydrALAZINE (APRESOLINE) 100 MG tablet Take 1 tablet (100 mg total) by mouth 3 (three) times daily. 270 tablet 3   levocetirizine (XYZAL) 5 MG tablet Take 1 tablet (5 mg total) by mouth every evening. 30 tablet 3   Multiple Vitamins-Minerals (ONE-A-DAY MENS 50+ ADVANTAGE PO) Take 1 tablet by mouth daily.     topiramate (TOPAMAX) 25 MG tablet TAKE TWO TABLETS BY MOUTH TWICE DAILY (FOR weight loss) 120 tablet 3    traMADol (ULTRAM) 50 MG tablet Take 1 tablet (50 mg total) by mouth every 6 (six) hours as needed. 15 tablet 0   valsartan (DIOVAN) 320 MG tablet TAKE 1 TABLET BY MOUTH EVERY DAY - STOP LISINOPRIL 90 tablet 0   No current facility-administered medications on file prior to visit.   Allergies  Allergen Reactions   Bee Venom Swelling   Lisinopril Cough        Objective:    BP (!) 150/72 Comment: per pt took meds this morning  Pulse 69   Temp 97.7 F (36.5 C) (Oral)   Ht 6\' 1"  (1.854 m)   Wt 243 lb (110.2 kg)   SpO2 98%   BMI 32.06 kg/m    Physical Exam Vitals and nursing note reviewed.  Constitutional:      Appearance: Normal appearance. He is normal weight.  HENT:     Head: Normocephalic and atraumatic.  Cardiovascular:     Rate and Rhythm: Normal rate and regular rhythm.     Pulses: Normal pulses.     Heart sounds: Normal heart sounds.  Pulmonary:     Effort: Pulmonary effort is normal.     Breath sounds: Normal breath sounds.  Musculoskeletal:        General: No swelling.  Skin:    General: Skin is  warm and dry.     Capillary Refill: Capillary refill takes less than 2 seconds.          Comments: hyperpigmentation  Neurological:     General: No focal deficit present.     Mental Status: He is alert and oriented to person, place, and time. Mental status is at baseline.  Psychiatric:        Mood and Affect: Mood normal.        Behavior: Behavior normal.        Thought Content: Thought content normal.        Judgment: Judgment normal.     No results found for any visits on 08/25/22.      Assessment & Plan:   Problem List Items Addressed This Visit       Cardiovascular and Mediastinum   Essential hypertension    Uncontrolled. He will follow up soon with Cardiology. Home readings 140-150 SBP. Instructed to seek medical care for chest pain, shortness of breath, palpitations, recurrent headaches, vision changes, or worsening swelling in extremities. Follow  up with PCP soon for annual physical.        Musculoskeletal and Integument   Stasis dermatitis of both legs - Primary    Hyperpigmentation present on exam to both feet with ankle swelling and tingling in evenings relieved with rest and elevation. I encouraged him to use compression stockings. Return to office if symptoms persist or worsen.        Other   Acute bilateral low back pain without sciatica    Chronic due to DDD. Requests refill on Baclofen.      Relevant Medications   baclofen (LIORESAL) 10 MG tablet    Meds ordered this encounter  Medications   baclofen (LIORESAL) 10 MG tablet    Sig: Take 1 tablet (10 mg total) by mouth daily as needed for muscle spasms.    Dispense:  30 each    Refill:  0    Order Specific Question:   Supervising Provider    Answer:   Jenna Luo T [[id number]]    Return in about 3 months (around 11/25/2022) for annual physical.  Rubie Maid, FNP

## 2022-08-25 NOTE — Assessment & Plan Note (Addendum)
Uncontrolled. He will follow up soon with Cardiology. Home readings 140-150 SBP. Instructed to seek medical care for chest pain, shortness of breath, palpitations, recurrent headaches, vision changes, or worsening swelling in extremities. Follow up with PCP soon for annual physical.

## 2022-08-25 NOTE — Assessment & Plan Note (Signed)
Chronic due to DDD. Requests refill on Baclofen.

## 2022-08-26 ENCOUNTER — Ambulatory Visit: Payer: Medicare Other | Admitting: Family Medicine

## 2022-08-29 ENCOUNTER — Other Ambulatory Visit: Payer: Self-pay | Admitting: Family Medicine

## 2022-08-29 ENCOUNTER — Other Ambulatory Visit: Payer: Self-pay | Admitting: Cardiology

## 2022-09-22 ENCOUNTER — Encounter (INDEPENDENT_AMBULATORY_CARE_PROVIDER_SITE_OTHER): Payer: Self-pay | Admitting: *Deleted

## 2022-10-20 ENCOUNTER — Other Ambulatory Visit: Payer: Self-pay | Admitting: Family Medicine

## 2022-10-20 MED ORDER — POLYMYXIN B-TRIMETHOPRIM 10000-0.1 UNIT/ML-% OP SOLN: 2.0000 [drp] | OPHTHALMIC | 0 refills | Status: DC

## 2022-11-04 ENCOUNTER — Encounter: Payer: Medicare Other | Admitting: Family Medicine

## 2022-11-11 ENCOUNTER — Encounter: Payer: Medicare Other | Admitting: Family Medicine

## 2022-11-11 ENCOUNTER — Ambulatory Visit: Payer: Medicare Other | Admitting: Family Medicine

## 2022-11-18 ENCOUNTER — Encounter: Payer: Self-pay | Admitting: Family Medicine

## 2022-11-18 ENCOUNTER — Other Ambulatory Visit (INDEPENDENT_AMBULATORY_CARE_PROVIDER_SITE_OTHER): Payer: Self-pay

## 2022-11-18 ENCOUNTER — Ambulatory Visit (INDEPENDENT_AMBULATORY_CARE_PROVIDER_SITE_OTHER): Payer: Medicare Other | Admitting: Family Medicine

## 2022-11-18 VITALS — BP 138/72 | HR 73 | Temp 97.8°F | Ht 73.0 in | Wt 242.0 lb

## 2022-11-18 DIAGNOSIS — Z125 Encounter for screening for malignant neoplasm of prostate: Secondary | ICD-10-CM | POA: Diagnosis not present

## 2022-11-18 DIAGNOSIS — Z0001 Encounter for general adult medical examination with abnormal findings: Secondary | ICD-10-CM

## 2022-11-18 DIAGNOSIS — I10 Essential (primary) hypertension: Secondary | ICD-10-CM | POA: Diagnosis not present

## 2022-11-18 DIAGNOSIS — D229 Melanocytic nevi, unspecified: Secondary | ICD-10-CM

## 2022-11-18 DIAGNOSIS — Z23 Encounter for immunization: Secondary | ICD-10-CM

## 2022-11-18 DIAGNOSIS — H538 Other visual disturbances: Secondary | ICD-10-CM | POA: Diagnosis not present

## 2022-11-18 DIAGNOSIS — Z0000 Encounter for general adult medical examination without abnormal findings: Secondary | ICD-10-CM

## 2022-11-18 NOTE — Progress Notes (Signed)
Subjective:    Patient ID: [name], male    DOB: 07/19/1960, 62 y.o.   MRN: [id number]  Patient is here today for complete physical exam.  For more than a year since I last saw him.  He is due for colonoscopy.  We have already made this referral.  He is waiting to hear back from them.  He is due for prostate cancer screening.  He is due for a tetanus shot as well as a vaccine.  Otherwise he is doing well.  He has a history of poorly controlled hypertension however his blood pressure today is excellent.  He denies any chest pain shortness of breath or dyspnea on exertion.  He does report some blurry vision and would like to establish ophthalmologist locally. Past Medical History:  Diagnosis Date   Hyperlipemia    Hypertension    Sleep apnea    Past Surgical History:  Procedure Laterality Date   APPENDECTOMY  2006   COLONOSCOPY N/A 10/01/2015   Procedure: COLONOSCOPY;  Surgeon: Malissa Hippo, MD;  [facility];  Service: Endoscopy;  Laterality: N/A;  1030   KNEE SURGERY     NECK SURGERY  2000   Current Outpatient Medications on File Prior to Visit  Medication Sig Dispense Refill   amLODipine (NORVASC) 10 MG tablet TAKE 1 TABLET BY MOUTH DAILY 90 tablet 3   aspirin EC 81 MG tablet Take 1 tablet (81 mg total) by mouth daily.     atorvastatin (LIPITOR) 40 MG tablet TAKE 1 TABLET BY MOUTH EVERY DAY 90 tablet 0   baclofen (LIORESAL) 10 MG tablet Take 1 tablet (10 mg total) by mouth daily as needed for muscle spasms. 30 each 0   carvedilol (COREG) 6.25 MG tablet TAKE 1 TABLET BY MOUTH TWICE DAILY 180 tablet 1   hydrALAZINE (APRESOLINE) 100 MG tablet TAKE 1 TABLET BY MOUTH THREE TIMES DAILY 270 tablet 1   levocetirizine (XYZAL) 5 MG tablet Take 1 tablet (5 mg total) by mouth every evening. 30 tablet 3   Multiple Vitamins-Minerals (ONE-A-DAY MENS 50+ ADVANTAGE PO) Take 1 tablet by mouth daily.     traMADol (ULTRAM) 50 MG tablet Take 1 tablet (50 mg total) by mouth every 6 (six)  hours as needed. 15 tablet 0   valsartan (DIOVAN) 320 MG tablet TAKE 1 TABLET BY MOUTH EVERY DAY 90 tablet 0   No current facility-administered medications on file prior to visit.   Allergies  Allergen Reactions   Bee Venom Swelling   Lisinopril Cough   Social History   Socioeconomic History   Marital status: Married    Spouse name: Not on file   Number of children: Not on file   Years of education: Not on file   Highest education level: Not on file  Occupational History   Not on file  Tobacco Use   Smoking status: Never    Passive exposure: Never   Smokeless tobacco: Never  Vaping Use   Vaping Use: Never used  Substance and Sexual Activity   Alcohol use: No    Alcohol/week: 0.0 standard drinks of alcohol   Drug use: No   Sexual activity: Yes  Other Topics Concern   Not on file  Social History Narrative   Married.    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    --Is available for students to call him, email him, etc   Says it is for a group of  multiple colleges--in [facility], etc.   Social Determinants of Health   Financial Resource Strain: Not on file  Food Insecurity: Not on file  Transportation Needs: Not on file  Physical Activity: Not on file  Stress: Not on file  Social Connections: Not on file  Intimate Partner Violence: Not on file    Review of Systems  All other systems reviewed and are negative.      Objective:   Physical Exam Vitals reviewed.  Constitutional:      General: He is not in acute distress.    Appearance: Normal appearance. He is normal weight. He is not ill-appearing, toxic-appearing or diaphoretic.  Cardiovascular:     Rate and Rhythm: Normal rate and regular rhythm.     Heart sounds: Normal heart sounds. No murmur heard.    No friction rub.  Pulmonary:     Effort: Pulmonary effort is normal. No respiratory distress.     Breath sounds: Normal breath sounds. No stridor. No wheezing, rhonchi or  rales.  Abdominal:     General: Bowel sounds are normal. There is no distension.     Palpations: Abdomen is soft.  Neurological:     Mental Status: He is alert.    Numerous atypical moles on his back and on his chest       Assessment & Plan:  Essential hypertension - Plan: CBC with Differential/Platelet, Lipid panel, COMPLETE METABOLIC PANEL WITH GFR  Prostate cancer screening - Plan: PSA  Blurry vision - Plan: Ambulatory referral to Ophthalmology  Multiple atypical skin moles - Plan: Ambulatory referral to Dermatology  General medical exam I have asked the patient to contact me if he does not hear from the gastroenterologist about scheduling the colonoscopy soon.  This is due to.  I will check a PSA today to screen for prostate cancer.  I will check a CBC a CMP and a fasting lipid panel.  His blood pressure today is well-controlled.  I would like to see his LDL cholesterol less than 782.  Patient received a tetanus vaccine today as well as the shingles vaccine.  I will consult ophthalmology due to his blurry vision and for annual eye exam.  I will also consult dermatology because of multiple atypical moles on the back and chest that may need a biopsy.  I would like him to see his dermatologist to get an expert opinion

## 2022-11-19 LAB — CBC WITH DIFFERENTIAL/PLATELET
Absolute Monocytes: 622 cells/uL (ref 200–950)
Basophils Absolute: 59 cells/uL (ref 0–200)
Basophils Relative: 0.8 %
Eosinophils Absolute: 259 cells/uL (ref 15–500)
Eosinophils Relative: 3.5 %
HCT: 47.1 % (ref 38.5–50.0)
Hemoglobin: 15.8 g/dL (ref 13.2–17.1)
Lymphs Abs: 1369 cells/uL (ref 850–3900)
MCH: 30.2 pg (ref 27.0–33.0)
MCHC: 33.5 g/dL (ref 32.0–36.0)
MCV: 90.1 fL (ref 80.0–100.0)
MPV: 9.9 fL (ref 7.5–12.5)
Monocytes Relative: 8.4 %
Neutro Abs: 5091 cells/uL (ref 1500–7800)
Neutrophils Relative %: 68.8 %
Platelets: 277 10*3/uL (ref 140–400)
RBC: 5.23 10*6/uL (ref 4.20–5.80)
RDW: 12.6 % (ref 11.0–15.0)
Total Lymphocyte: 18.5 %
WBC: 7.4 10*3/uL (ref 3.8–10.8)

## 2022-11-19 LAB — COMPLETE METABOLIC PANEL WITH GFR
AG Ratio: 2 (calc) (ref 1.0–2.5)
ALT: 35 U/L (ref 9–46)
AST: 20 U/L (ref 10–35)
Albumin: 4.7 g/dL (ref 3.6–5.1)
Alkaline phosphatase (APISO): 100 U/L (ref 35–144)
BUN: 13 mg/dL (ref 7–25)
CO2: 28 mmol/L (ref 20–32)
Calcium: 9.5 mg/dL (ref 8.6–10.3)
Chloride: 106 mmol/L (ref 98–110)
Creat: 1.03 mg/dL (ref 0.70–1.35)
Globulin: 2.3 g/dL (calc) (ref 1.9–3.7)
Glucose, Bld: 113 mg/dL — ABNORMAL HIGH (ref 65–99)
Potassium: 4.1 mmol/L (ref 3.5–5.3)
Sodium: 142 mmol/L (ref 135–146)
Total Bilirubin: 0.7 mg/dL (ref 0.2–1.2)
Total Protein: 7 g/dL (ref 6.1–8.1)
eGFR: 82 mL/min/{1.73_m2} (ref >=60)

## 2022-11-19 LAB — PSA: PSA: 1.71 ng/mL (ref <=4.00)

## 2022-11-19 LAB — LIPID PANEL
Cholesterol: 121 mg/dL (ref <200)
HDL: 41 mg/dL (ref >=40)
LDL Cholesterol (Calc): 63 mg/dL (calc)
Non-HDL Cholesterol (Calc): 80 mg/dL (calc) (ref <130)
Total CHOL/HDL Ratio: 3 (calc) (ref <5.0)
Triglycerides: 88 mg/dL (ref <150)

## 2022-11-21 ENCOUNTER — Encounter: Payer: Self-pay | Admitting: Family Medicine

## 2022-11-22 ENCOUNTER — Other Ambulatory Visit: Payer: Self-pay

## 2022-11-22 DIAGNOSIS — H538 Other visual disturbances: Secondary | ICD-10-CM

## 2022-11-22 DIAGNOSIS — H539 Unspecified visual disturbance: Secondary | ICD-10-CM

## 2022-11-27 ENCOUNTER — Other Ambulatory Visit: Payer: Self-pay | Admitting: Cardiology

## 2022-11-27 ENCOUNTER — Other Ambulatory Visit: Payer: Self-pay | Admitting: Family Medicine

## 2023-01-31 ENCOUNTER — Encounter: Payer: Self-pay | Admitting: Nurse Practitioner

## 2023-01-31 ENCOUNTER — Ambulatory Visit: Payer: Medicare Other | Attending: Nurse Practitioner | Admitting: Nurse Practitioner

## 2023-01-31 VITALS — BP 161/92 | HR 79 | Ht 74.0 in | Wt 241.4 lb

## 2023-01-31 DIAGNOSIS — E785 Hyperlipidemia, unspecified: Secondary | ICD-10-CM

## 2023-01-31 DIAGNOSIS — I1A0 Resistant hypertension: Secondary | ICD-10-CM | POA: Diagnosis not present

## 2023-01-31 DIAGNOSIS — I77819 Aortic ectasia, unspecified site: Secondary | ICD-10-CM | POA: Diagnosis not present

## 2023-01-31 DIAGNOSIS — G4733 Obstructive sleep apnea (adult) (pediatric): Secondary | ICD-10-CM | POA: Diagnosis not present

## 2023-01-31 DIAGNOSIS — I10 Essential (primary) hypertension: Secondary | ICD-10-CM

## 2023-01-31 MED ORDER — CARVEDILOL 12.5 MG PO TABS: 12.5000 mg | ORAL_TABLET | Freq: Two times a day (BID) | ORAL | 3 refills | Status: DC

## 2023-01-31 NOTE — Patient Instructions (Addendum)
Medication Instructions:  Your physician has recommended you make the following change in your medication:  Increase Carvedilol to 12.5 Mg Twice Daily  Continue all other medications as prescribed   Labwork: None  Testing/Procedures: None  Follow-Up: Your physician recommends that you schedule a follow-up appointment in: 1 Year [place]   Any Other Special Instructions Will Be Listed Below (If Applicable).   If you need a refill on your cardiac medications before your next appointment, please call your pharmacy.

## 2023-01-31 NOTE — Progress Notes (Addendum)
Cardiology Office Note:  .   Date:  01/31/2023 ID:  George Pratt, DOB Mar 29, 1961, MRN [id number] PCP: Donita Brooks, MD   [facility] Providers Cardiologist:  Dina Rich, MD    History of Present Illness: .   George Pratt is a 62 y.o. male with a PMH of hypertension, aortic dilatation, OSA, IBS, DDD, chronic pain, and hyperlipidemia, who presents today for 1 year follow-up.    Previous cardiovascular history of difficulty controlling BP.  Has history of not being able to tolerate lisinopril (caused a cough).  No evidence of RAS by prior CTA.  Was being followed by pulmonology for OSA evaluation.  Last seen by Dr. Theadore Nan on January 27, 2022.  Renin/aldosterone levels were normal.  Dr. Wyline Mood recommended possibly starting Aldactone if additional agent was needed for BP control.   Today he presents for follow-up. Doing well, but says he does continue to note his blood pressure remains elevated, SBP averages 150's. Believes his hx of DDD and chronic pain is contributing to this. Says he lives in 6-7 pain on 0-10 pain scale daily. Does not have machine for his sleep apnea as he says his preferred pharmacy [facility] pharmacy) lost the order. Denies any chest pain, shortness of breath, palpitations, syncope, presyncope, dizziness, orthopnea, PND, swelling or significant weight changes, acute bleeding, or claudication.   SH: Former Environmental manager, currently works at [employer]. Father lived to be 63 and was pediatrician for many years.  FH: Dad- HTN  Studies Reviewed: Marland Kitchen   EKG Interpretation Date/Time:  Tuesday January 31 2023 11:08:21 EDT Ventricular Rate:  73 PR Interval:  200 QRS Duration:  108 QT Interval:  414 QTC Calculation: 456 R Axis:   71  Text Interpretation: Normal sinus rhythm Normal ECG When compared with ECG of 09-Oct-2017 05:20, PREVIOUS ECG IS PRESENT Confirmed by Sharlene Dory [id number]) on 01/31/2023 11:14:18 AM    Echo 07/2021:  1. Left  ventricular ejection fraction, by estimation, is 55 to 60%. The  left ventricle has normal function. The left ventricle has no regional  wall motion abnormalities. Left ventricular diastolic parameters are  indeterminate. The average left  ventricular global longitudinal strain is -18.7 %. The global longitudinal  strain is normal.   2. Right ventricular systolic function is normal. The right ventricular  size is normal. Tricuspid regurgitation signal is inadequate for assessing  PA pressure.   3. The mitral valve is normal in structure. No evidence of mitral valve  regurgitation. No evidence of mitral stenosis.   4. The aortic valve is tricuspid. Aortic valve regurgitation is trivial.  No aortic stenosis is present.   5. Aortic dilatation noted. There is mild dilatation of the aortic root,  measuring 41 mm. There is mild dilatation of the ascending aorta,  measuring 38 mm.   6. The inferior vena cava is normal in size with greater than 50%  respiratory variability, suggesting right atrial pressure of 3 mmHg.   Comparison(s): LVEF 60-65%, Aortic Root 4.4cm.  Lexiscan 10/2017: Blood pressure demonstrated a hypertensive response to exercise. There was no ST segment deviation noted during stress. Defect 1: There is a medium defect of mild severity present in the mid inferoseptal and apical inferior location. Images are improved on stress when compared to rest (reverse redistribution). Hence, findings appear to be due to soft tissue attenuation. No significant ischemic zones. Regional wall motion is normal. This is a low risk study. Nuclear stress EF: 61%.  Risk Assessment/Calculations:  HYPERTENSION CONTROL Vitals:   01/31/23 1056 01/31/23 1125  BP: (!) 158/84 (!) 161/92    The patient's blood pressure is elevated above target today.  In order to address the patient's elevated BP: A current anti-hypertensive medication was adjusted today.; Follow up with general cardiology has  been recommended.          Physical Exam:   VS:  BP (!) 161/92 (BP Location: Left Arm, Patient Position: Sitting, Cuff Size: Normal)   Pulse 79   Ht 6\' 2"  (1.88 m)   Wt 241 lb 6.4 oz (109.5 kg)   SpO2 97%   BMI 30.99 kg/m    Wt Readings from Last 3 Encounters:  01/31/23 241 lb 6.4 oz (109.5 kg)  11/18/22 242 lb (109.8 kg)  08/25/22 243 lb (110.2 kg)    GEN: Well nourished, well developed in no acute distress NECK: No JVD; No carotid bruits CARDIAC: S1/S2, RRR, no murmurs, rubs, gallops RESPIRATORY:  Clear to auscultation without rales, wheezing or rhonchi  ABDOMEN: Soft, non-tender, non-distended EXTREMITIES:  No edema; No deformity   ASSESSMENT AND PLAN: .    Resistant HTN Longstanding hx of poorly controlled BP. Etiology multifactorial. Past renin/aldo levels and ratio normal. No evidence of RAS by prior CTA. Does have known OSA - followed by Pulmonology (see #4). Discussed medication management options. Came to shared medical decision with patient and patient agreeable to increase Coreg at this time before starting a new medication. Will increase Coreg to Coreg to 12.5 mg BID. Continue rest of medication regimen. Discussed SBP goal < 140. He will let us know in next few weeks if SBP remains above 140. If no improvement in BP, recommend starting low dose Aldactone/hydrochlorothiazide. Discussed to monitor BP at home at least 2 hours after medications and sitting for 5-10 minutes. Low salt, heart healthy diet and regular cardiovascular exercise encouraged.   HLD LDL 10/2022 88. Continue atorvastatin. Heart healthy diet and regular cardiovascular exercise encouraged. Continue to follow with PCP.  Aortic dilatation Echo 07/2021 showed mild dilatation of aortic root measuring 41 mm with mild dilatation of ascending aorta at 38 mm. Dr. Wyline Mood stated in result note that "not of concern, just something to continue to monitor over time." Due to mild dilatation, will route note to Dr. Wyline Mood  to determine timing of repeating Echocardiogram for monitoring.   Addendum 02/06/2023: Dr. Wyline Mood said "okay to repeat Echo in 1 year."  OSA Pt states he has not received his machine for his OSA as his pharmacy "lost the order" per his report. Will route note to his pulmonologist, Dr. Vassie Loll to help provide assistance with this. Discussed with patient that treatment for his OSA will also help his BP. Continue to follow-up with pulmonology.   Dispo: Follow-up with Dr. Dina Rich or APP in 1 year or sooner if anything changes.   Signed, Sharlene Dory, NP

## 2023-02-01 ENCOUNTER — Encounter: Payer: Self-pay | Admitting: Nurse Practitioner

## 2023-02-09 ENCOUNTER — Ambulatory Visit (INDEPENDENT_AMBULATORY_CARE_PROVIDER_SITE_OTHER): Payer: Medicare Other

## 2023-02-09 VITALS — Ht 74.0 in | Wt 241.0 lb

## 2023-02-09 DIAGNOSIS — Z0000 Encounter for general adult medical examination without abnormal findings: Secondary | ICD-10-CM

## 2023-02-09 NOTE — Patient Instructions (Signed)
George Pratt , Thank you for taking time to come for your Medicare Wellness Visit. I appreciate your ongoing commitment to your health goals. Please review the following plan we discussed and let me know if I can assist you in the future.   Referrals/Orders/Follow-Ups/Clinician Recommendations: Aim for 30 minutes of exercise or brisk walking, 6-8 glasses of water, and 5 servings of fruits and vegetables each day.  This is a list of the screening recommended for you and due dates:  Health Maintenance  Topic Date Due   COVID-19 Vaccine (1) Never done   Flu Shot  12/29/2022   Zoster (Shingles) Vaccine (2 of 2) 01/13/2023   Medicare Annual Wellness Visit  02/09/2024   Colon Cancer Screening  09/30/2025   DTaP/Tdap/Td vaccine (3 - Td or Tdap) 11/17/2032   Hepatitis C Screening  Completed   HIV Screening  Completed   HPV Vaccine  Aged Out    Advanced directives: (ACP Link)Information on Advanced Care Planning can be found at [facility] Advance Health Care Directives Advance Health Care Directives ([email or web address]   Next Medicare Annual Wellness Visit scheduled for next year: Yes

## 2023-02-09 NOTE — Progress Notes (Signed)
Subjective:   George Pratt is a 62 y.o. male who presents for Medicare Annual/Subsequent preventive examination.  Visit Complete: Virtual  I connected with  George Pratt on 02/09/23 by a audio enabled telemedicine application and verified that I am speaking with the correct person using two identifiers.  Patient Location: Home  Provider Location: Home Office  I discussed the limitations of evaluation and management by telemedicine. The patient expressed understanding and agreed to proceed.  Vital Signs: Because this visit was a virtual/telehealth visit, some criteria may be missing or patient reported. Any vitals not documented were not able to be obtained and vitals that have been documented are patient reported.   Cardiac Risk Factors include: hypertension;male gender;advanced age (>36men, >48 women)     Objective:    Today's Vitals   02/09/23 1517  Weight: 241 lb (109.3 kg)  Height: 6\' 2"  (1.88 m)   Body mass index is 30.94 kg/m.     02/09/2023    5:51 PM 03/12/2018    8:41 AM 10/09/2017    5:27 AM 01/20/2016    5:09 PM 10/01/2015    9:31 AM 07/23/2015    6:39 AM 02/07/2014    4:25 PM  Advanced Directives  Does Patient Have a Medical Advance Directive? No No No No No No No  Would patient like information on creating a medical advance directive? Yes (MAU/Ambulatory/Procedural Areas - Information given) No - Patient declined No - Patient declined No - patient declined information No - patient declined information Yes - Educational materials given     Current Medications (verified) Outpatient Encounter Medications as of 02/09/2023  Medication Sig   amLODipine (NORVASC) 10 MG tablet TAKE 1 TABLET BY MOUTH DAILY   aspirin EC 81 MG tablet Take 1 tablet (81 mg total) by mouth daily.   atorvastatin (LIPITOR) 40 MG tablet TAKE 1 TABLET BY MOUTH EVERY DAY   baclofen (LIORESAL) 10 MG tablet Take 1 tablet (10 mg total) by mouth daily as needed for muscle spasms.   carvedilol  (COREG) 12.5 MG tablet Take 1 tablet (12.5 mg total) by mouth 2 (two) times daily.   hydrALAZINE (APRESOLINE) 100 MG tablet TAKE 1 TABLET BY MOUTH THREE TIMES DAILY   levocetirizine (XYZAL) 5 MG tablet Take 1 tablet (5 mg total) by mouth every evening.   Multiple Vitamins-Minerals (ONE-A-DAY MENS 50+ ADVANTAGE PO) Take 1 tablet by mouth daily.   valsartan (DIOVAN) 320 MG tablet TAKE 1 TABLET BY MOUTH EVERY DAY   No facility-administered encounter medications on file as of 02/09/2023.    Allergies (verified) Bee venom and Lisinopril   History: Past Medical History:  Diagnosis Date   Hyperlipemia    Hypertension    Sleep apnea    Past Surgical History:  Procedure Laterality Date   APPENDECTOMY  2006   COLONOSCOPY N/A 10/01/2015   Procedure: COLONOSCOPY;  Surgeon: Malissa Hippo, MD;  Location: [facility];  Service: Endoscopy;  Laterality: N/A;  1030   KNEE SURGERY     NECK SURGERY  2000   Family History  Problem Relation Age of Onset   Hypertension Mother    Heart failure Mother    Heart disease Mother    Hypertension Father    Diabetes Father    Vision loss Father    Hypertension Brother    Hypertension Brother    Hypertension Brother    Social History   Socioeconomic History   Marital status: Married    Spouse name:  Not on file   Number of children: Not on file   Years of education: Not on file   Highest education level: Not on file  Occupational History   Not on file  Tobacco Use   Smoking status: Never    Passive exposure: Never   Smokeless tobacco: Never  Vaping Use   Vaping status: Never Used  Substance and Sexual Activity   Alcohol use: No    Alcohol/week: 0.0 standard drinks of alcohol   Drug use: No   Sexual activity: Yes  Other Topics Concern   Not on file  Social History Narrative   Married.    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    --Is available for students to call him, email him, etc   Says it is  for a group of multiple colleges--in [employer], etc.   Social Determinants of Health   Financial Resource Strain: Low Risk  (02/09/2023)   Overall Financial Resource Strain (CARDIA)    Difficulty of Paying Living Expenses: Not hard at all  Food Insecurity: No Food Insecurity (02/09/2023)   Hunger Vital Sign    Worried About Running Out of Food in the Last Year: Never true    Ran Out of Food in the Last Year: Never true  Transportation Needs: No Transportation Needs (02/09/2023)   PRAPARE - Administrator, Civil Service (Medical): No    Lack of Transportation (Non-Medical): No  Physical Activity: Insufficiently Active (02/09/2023)   Exercise Vital Sign    Days of Exercise per Week: 3 days    Minutes of Exercise per Session: 30 min  Stress: No Stress Concern Present (02/09/2023)   [employer] of Occupational Health - Occupational Stress Questionnaire    Feeling of Stress : Not at all  Social Connections: Socially Integrated (02/09/2023)   Social Connection and Isolation Panel [NHANES]    Frequency of Communication with Friends and Family: More than three times a week    Frequency of Social Gatherings with Friends and Family: Three times a week    Attends Religious Services: More than 4 times per year    Active Member of Clubs or Organizations: Yes    Attends Engineer, structural: More than 4 times per year    Marital Status: Married    Tobacco Counseling Counseling given: Not Answered   Clinical Intake:  Pre-visit preparation completed: Yes  Pain : No/denies pain     Diabetes: No  How often do you need to have someone help you when you read instructions, pamphlets, or other written materials from your doctor or pharmacy?: 1 - Never  Interpreter Needed?: No  Information entered by :: Kandis Fantasia LPN   Activities of Daily Living    02/09/2023    3:20 PM  In your present state of health, do you have any difficulty performing the following  activities:  Hearing? 0  Vision? 0  Difficulty concentrating or making decisions? 0  Walking or climbing stairs? 0  Dressing or bathing? 0  Doing errands, shopping? 0  Preparing Food and eating ? N  Using the Toilet? N  In the past six months, have you accidently leaked urine? N  Do you have problems with loss of bowel control? N  Managing your Medications? N  Managing your Finances? N  Housekeeping or managing your Housekeeping? N    Patient Care Team: Donita Brooks, MD as PCP - General (Family Medicine) Antoine Poche,  MD as PCP - Cardiology (Cardiology) Oretha Milch, MD as Consulting Physician (Pulmonary Disease)  Indicate any recent Medical Services you may have received from other than [facility] providers in the past year (date may be approximate).     Assessment:   This is a routine wellness examination for Keshav.  Hearing/Vision screen Hearing Screening - Comments:: Denies hearing difficulties    Vision Screening - Comments:: No vision problems; will schedule routine eye exam soon     Goals Addressed             This Visit's Progress    Blood Pressure < 140/90        Depression Screen    02/09/2023    3:31 PM 11/18/2022    8:34 AM 03/12/2018    8:42 AM 11/27/2017    4:04 PM 08/02/2017   11:16 AM 03/08/2017    8:36 AM 03/02/2016    9:44 AM  PHQ 2/9 Scores  PHQ - 2 Score 0 0 0 0 0 0 0  PHQ- 9 Score  0    0 0    Fall Risk    02/09/2023    5:55 PM 11/18/2022    8:35 AM 03/08/2017    8:36 AM  Fall Risk   Falls in the past year? 0 0 No  Number falls in past yr: 0 0   Injury with Fall? 0 0   Risk for fall due to : No Fall Risks No Fall Risks   Follow up Falls prevention discussed;Education provided;Falls evaluation completed Falls evaluation completed;Education provided     MEDICARE RISK AT HOME: Medicare Risk at Home Any stairs in or around the home?: No If so, are there any without handrails?: No Home free of loose throw rugs in walkways, pet  beds, electrical cords, etc?: Yes Adequate lighting in your home to reduce risk of falls?: Yes Life alert?: No Use of a cane, walker or w/c?: No Grab bars in the bathroom?: Yes Shower chair or bench in shower?: No Elevated toilet seat or a handicapped toilet?: No  TIMED UP AND GO:  Was the test performed?  No    Cognitive Function:        02/09/2023    5:57 PM 03/12/2018    8:43 AM  6CIT Screen  What Year? 0 points 0 points  What month? 0 points   What time? 0 points 0 points  Count back from 20 0 points 0 points  Months in reverse 0 points 0 points  Repeat phrase 2 points 4 points  Total Score 2 points     Immunizations Immunization History  Administered Date(s) Administered   Influenza,inj,Quad PF,6+ Mos 03/02/2016, 03/08/2017, 04/02/2018, 06/12/2019, 03/31/2020   Tdap 05/30/2010, 11/18/2022   Zoster Recombinant(Shingrix) 11/18/2022    TDAP status: Due, Education has been provided regarding the importance of this vaccine. Advised may receive this vaccine at local pharmacy or Health Dept. Aware to provide a copy of the vaccination record if obtained from local pharmacy or Health Dept. Verbalized acceptance and understanding.  Flu Vaccine status: Due, Education has been provided regarding the importance of this vaccine. Advised may receive this vaccine at local pharmacy or Health Dept. Aware to provide a copy of the vaccination record if obtained from local pharmacy or Health Dept. Verbalized acceptance and understanding.  Pneumococcal vaccine status: Up to date  Covid-19 vaccine status: Information provided on how to obtain vaccines.   Qualifies for Shingles Vaccine? Yes   Zostavax completed No  Shingrix Completed?: No.    Education has been provided regarding the importance of this vaccine. Patient has been advised to call insurance company to determine out of pocket expense if they have not yet received this vaccine. Advised may also receive vaccine at local pharmacy  or Health Dept. Verbalized acceptance and understanding.  Screening Tests Health Maintenance  Topic Date Due   COVID-19 Vaccine (1) Never done   INFLUENZA VACCINE  12/29/2022   Zoster Vaccines- Shingrix (2 of 2) 01/13/2023   Medicare Annual Wellness (AWV)  02/09/2024   Colonoscopy  09/30/2025   DTaP/Tdap/Td (3 - Td or Tdap) 11/17/2032   Hepatitis C Screening  Completed   HIV Screening  Completed   HPV VACCINES  Aged Out    Health Maintenance  Health Maintenance Due  Topic Date Due   COVID-19 Vaccine (1) Never done   INFLUENZA VACCINE  12/29/2022   Zoster Vaccines- Shingrix (2 of 2) 01/13/2023    Colorectal cancer screening: Type of screening: Colonoscopy. Completed 10/01/15. Repeat every 10 years  Lung Cancer Screening: (Low Dose CT Chest recommended if Age 51-80 years, 20 pack-year currently smoking OR have quit w/in 15years.) does not qualify.   Lung Cancer Screening Referral: n/a  Additional Screening:  Hepatitis C Screening: does qualify; Completed 03/08/17  Vision Screening: Recommended annual ophthalmology exams for early detection of glaucoma and other disorders of the eye. Is the patient up to date with their annual eye exam?  No  Who is the provider or what is the name of the office in which the patient attends annual eye exams? none If pt is not established with a provider, would they like to be referred to a provider to establish care? Yes .   Dental Screening: Recommended annual dental exams for proper oral hygiene  Community Resource Referral / Chronic Care Management: CRR required this visit?  No   CCM required this visit?  No     Plan:     I have personally reviewed and noted the following in the patient's chart:   Medical and social history Use of alcohol, tobacco or illicit drugs  Current medications and supplements including opioid prescriptions. Patient is not currently taking opioid prescriptions. Functional ability and status Nutritional  status Physical activity Advanced directives List of other physicians Hospitalizations, surgeries, and ER visits in previous 12 months Vitals Screenings to include cognitive, depression, and falls Referrals and appointments  In addition, I have reviewed and discussed with patient certain preventive protocols, quality metrics, and best practice recommendations. A written personalized care plan for preventive services as well as general preventive health recommendations were provided to patient.     Kandis Fantasia [place], [place]   09/21/9561   After Visit Summary: (MyChart) Due to this being a telephonic visit, the after visit summary with patients personalized plan was offered to patient via MyChart   Nurse Notes:  Patient is asking for the status of referrals to ophthalmology and dermatology.

## 2023-02-23 NOTE — Telephone Encounter (Signed)
BP's too high pretty consistently. His goal bp would be <130/80. Make sure sitting for at least 5 minutes prior to checking. Id like to retry a medication called chlorthalidone, there was some question about side effects in the past of dry mouth and constipation but the symptoms continued even off the medication and much more likely to have been related to clonidine he had been on. If willing would start chlorthalidone 25mg  daily, bmet/mg in 2 weeks   Dominga Ferry MD

## 2023-02-25 ENCOUNTER — Other Ambulatory Visit: Payer: Self-pay | Admitting: Family Medicine

## 2023-02-25 ENCOUNTER — Other Ambulatory Visit: Payer: Self-pay | Admitting: Cardiology

## 2023-02-27 ENCOUNTER — Other Ambulatory Visit: Payer: Self-pay | Admitting: Cardiology

## 2023-02-27 MED ORDER — CARVEDILOL 12.5 MG PO TABS: 12.5000 mg | ORAL_TABLET | Freq: Two times a day (BID) | ORAL | 3 refills | Status: AC

## 2023-02-27 NOTE — Telephone Encounter (Signed)
Requested Prescriptions  Pending Prescriptions Disp Refills   atorvastatin (LIPITOR) 40 MG tablet [Pharmacy Med Name: atorvastatin 40 mg tablet] 90 tablet 1    Sig: TAKE 1 TABLET BY MOUTH EVERY DAY     Cardiovascular:  Antilipid - Statins Failed - 02/25/2023  8:01 AM      Failed - Valid encounter within last 12 months    Recent Outpatient Visits           1 year ago Essential hypertension   [facility] Pickard, Priscille Heidelberg, MD   1 year ago Chronic cough   [facility] Tanya Nones, Priscille Heidelberg, MD   2 years ago Inflamed skin tag   [facility] Pickard, Priscille Heidelberg, MD   2 years ago COVID-19   [facility] Pickard, Priscille Heidelberg, MD   2 years ago Need for immunization against influenza   [facility] Donita Brooks, MD              Failed - Lipid Panel in normal range within the last 12 months    Cholesterol  Date Value Ref Range Status  11/18/2022 121 <200 mg/dL Final   LDL Cholesterol (Calc)  Date Value Ref Range Status  11/18/2022 63 mg/dL (calc) Final    Comment:    Reference range: <100 . Desirable range <100 mg/dL for primary prevention;   <70 mg/dL for patients with CHD or diabetic patients  with > or = 2 CHD risk factors. Marland Kitchen LDL-C is now calculated using the Martin-Hopkins  calculation, which is a validated novel method providing  better accuracy than the Friedewald equation in the  estimation of LDL-C.  Horald Pollen et al. Lenox Ahr. 1610;960(45): 2061-2068  (http://education.[facility].com/faq/FAQ164)    HDL  Date Value Ref Range Status  11/18/2022 41 > OR = 40 mg/dL Final   Triglycerides  Date Value Ref Range Status  11/18/2022 88 <150 mg/dL Final         Passed - Patient is not pregnant       valsartan (DIOVAN) 320 MG tablet [Pharmacy Med Name: valsartan 320 mg tablet] 90 tablet 1    Sig: TAKE 1 TABLET BY MOUTH EVERY DAY     Cardiovascular:  Angiotensin Receptor Blockers Failed -  02/25/2023  8:01 AM      Failed - Last BP in normal range    BP Readings from Last 1 Encounters:  01/31/23 (!) 161/92         Failed - Valid encounter within last 6 months    Recent Outpatient Visits           1 year ago Essential hypertension   [facility] Donita Brooks, MD   1 year ago Chronic cough   [facility] Donita Brooks, MD   2 years ago Inflamed skin tag   [facility] Tanya Nones, Priscille Heidelberg, MD   2 years ago COVID-19   [facility] Pickard, Priscille Heidelberg, MD   2 years ago Need for immunization against influenza   [facility] Pickard, Priscille Heidelberg, MD              Passed - Cr in normal range and within 180 days    Creat  Date Value Ref Range Status  11/18/2022 1.03 0.70 - 1.35 mg/dL Final         Passed - K in normal range and within 180 days  Potassium  Date Value Ref Range Status  11/18/2022 4.1 3.5 - 5.3 mmol/L Final         Passed - Patient is not pregnant

## 2023-04-06 DIAGNOSIS — H524 Presbyopia: Secondary | ICD-10-CM | POA: Diagnosis not present

## 2023-04-06 DIAGNOSIS — H35033 Hypertensive retinopathy, bilateral: Secondary | ICD-10-CM | POA: Diagnosis not present

## 2023-05-05 ENCOUNTER — Ambulatory Visit (INDEPENDENT_AMBULATORY_CARE_PROVIDER_SITE_OTHER): Payer: Medicare Other

## 2023-05-05 DIAGNOSIS — R739 Hyperglycemia, unspecified: Secondary | ICD-10-CM | POA: Diagnosis not present

## 2023-05-05 DIAGNOSIS — I10 Essential (primary) hypertension: Secondary | ICD-10-CM

## 2023-05-05 DIAGNOSIS — Z23 Encounter for immunization: Secondary | ICD-10-CM

## 2023-05-05 NOTE — Progress Notes (Signed)
Patient is in office today for a nurse visit for Immunization. Patient Injection was given in the  Left deltoid. Patient tolerated injection well. 2ND SHINGLES VACCINE. MJP,LPN

## 2023-05-09 LAB — LIPID PANEL
Cholesterol: 129 mg/dL (ref <200)
HDL: 45 mg/dL (ref >=40)
LDL Cholesterol (Calc): 68 mg/dL
Non-HDL Cholesterol (Calc): 84 mg/dL (ref <130)
Total CHOL/HDL Ratio: 2.9 (calc) (ref <5.0)
Triglycerides: 84 mg/dL (ref <150)

## 2023-05-09 LAB — CBC WITH DIFFERENTIAL/PLATELET
Absolute Lymphocytes: 1366 {cells}/uL (ref 850–3900)
Absolute Monocytes: 587 {cells}/uL (ref 200–950)
Basophils Absolute: 59 {cells}/uL (ref 0–200)
Basophils Relative: 0.9 %
Eosinophils Absolute: 290 {cells}/uL (ref 15–500)
Eosinophils Relative: 4.4 %
HCT: 46.7 % (ref 38.5–50.0)
Hemoglobin: 15.9 g/dL (ref 13.2–17.1)
MCH: 30.8 pg (ref 27.0–33.0)
MCHC: 34 g/dL (ref 32.0–36.0)
MCV: 90.3 fL (ref 80.0–100.0)
MPV: 10.2 fL (ref 7.5–12.5)
Monocytes Relative: 8.9 %
Neutro Abs: 4297 {cells}/uL (ref 1500–7800)
Neutrophils Relative %: 65.1 %
Platelets: 260 10*3/uL (ref 140–400)
RBC: 5.17 10*6/uL (ref 4.20–5.80)
RDW: 12.6 % (ref 11.0–15.0)
Total Lymphocyte: 20.7 %
WBC: 6.6 10*3/uL (ref 3.8–10.8)

## 2023-05-09 LAB — COMPLETE METABOLIC PANEL WITH GFR
AG Ratio: 2 (calc) (ref 1.0–2.5)
ALT: 24 U/L (ref 9–46)
AST: 14 U/L (ref 10–35)
Albumin: 4.3 g/dL (ref 3.6–5.1)
Alkaline phosphatase (APISO): 105 U/L (ref 35–144)
BUN: 13 mg/dL (ref 7–25)
CO2: 28 mmol/L (ref 20–32)
Calcium: 9.3 mg/dL (ref 8.6–10.3)
Chloride: 107 mmol/L (ref 98–110)
Creat: 0.97 mg/dL (ref 0.70–1.35)
Globulin: 2.2 g/dL (ref 1.9–3.7)
Glucose, Bld: 131 mg/dL — ABNORMAL HIGH (ref 65–99)
Potassium: 4.5 mmol/L (ref 3.5–5.3)
Sodium: 143 mmol/L (ref 135–146)
Total Bilirubin: 0.6 mg/dL (ref 0.2–1.2)
Total Protein: 6.5 g/dL (ref 6.1–8.1)
eGFR: 87 mL/min/{1.73_m2} (ref >=60)

## 2023-05-09 LAB — TEST AUTHORIZATION

## 2023-05-09 LAB — HEMOGLOBIN A1C
Hgb A1c MFr Bld: 5.4 %{Hb} (ref <5.7)
Mean Plasma Glucose: 108 mg/dL
eAG (mmol/L): 6 mmol/L

## 2023-07-07 DIAGNOSIS — H35363 Drusen (degenerative) of macula, bilateral: Secondary | ICD-10-CM | POA: Diagnosis not present

## 2023-08-10 DIAGNOSIS — H35363 Drusen (degenerative) of macula, bilateral: Secondary | ICD-10-CM | POA: Diagnosis not present

## 2023-10-30 ENCOUNTER — Encounter: Payer: Self-pay | Admitting: Family Medicine

## 2023-10-31 ENCOUNTER — Other Ambulatory Visit

## 2023-10-31 ENCOUNTER — Other Ambulatory Visit: Payer: Self-pay

## 2023-10-31 DIAGNOSIS — Z125 Encounter for screening for malignant neoplasm of prostate: Secondary | ICD-10-CM

## 2023-11-01 ENCOUNTER — Other Ambulatory Visit

## 2023-11-01 DIAGNOSIS — Z125 Encounter for screening for malignant neoplasm of prostate: Secondary | ICD-10-CM

## 2023-11-01 LAB — PSA: PSA: 1.53 ng/mL (ref <=4.00)

## 2023-11-02 ENCOUNTER — Ambulatory Visit: Payer: Self-pay | Admitting: Family Medicine

## 2023-11-16 ENCOUNTER — Ambulatory Visit: Payer: Medicare Other

## 2023-11-20 ENCOUNTER — Other Ambulatory Visit: Payer: Self-pay | Admitting: Cardiology

## 2023-11-20 ENCOUNTER — Other Ambulatory Visit: Payer: Self-pay | Admitting: Family Medicine

## 2024-01-26 ENCOUNTER — Encounter: Payer: Self-pay | Admitting: Nurse Practitioner

## 2024-01-26 ENCOUNTER — Ambulatory Visit: Attending: Nurse Practitioner | Admitting: Nurse Practitioner

## 2024-01-26 VITALS — BP 130/84 | HR 62 | Ht 73.0 in | Wt 243.4 lb

## 2024-01-26 DIAGNOSIS — I77819 Aortic ectasia, unspecified site: Secondary | ICD-10-CM | POA: Diagnosis not present

## 2024-01-26 DIAGNOSIS — E785 Hyperlipidemia, unspecified: Secondary | ICD-10-CM

## 2024-01-26 DIAGNOSIS — I1A0 Resistant hypertension: Secondary | ICD-10-CM

## 2024-01-26 DIAGNOSIS — G4733 Obstructive sleep apnea (adult) (pediatric): Secondary | ICD-10-CM

## 2024-01-26 NOTE — Patient Instructions (Addendum)

## 2024-01-26 NOTE — Progress Notes (Addendum)
 Cardiology Office Note:  .   Date:  01/26/2024 ID:  George Pratt, DOB [birth year], MRN [id number] PCP: George Pratt DASEN, MD  [facility] Providers Cardiologist:  George Carrier, MD    History of Present Illness: .   George Pratt is a 63 y.o. male with a PMH of hypertension, aortic dilatation, OSA, IBS, DDD, chronic pain, and hyperlipidemia, who presents today for 1 year follow-up.    Previous cardiovascular history of difficulty controlling BP.  Has history of not being able to tolerate lisinopril  (caused a cough).  No evidence of RAS by prior CTA.  Was being followed by pulmonology for OSA evaluation.  Last seen by George Pratt on January 27, 2022.  Renin/aldosterone levels were normal.  Dr. Alvan recommended possibly starting Aldactone if additional agent was needed for BP control.   01/31/2023 - Today he presents for follow-up. Doing well, but says he does continue to note his blood pressure remains elevated, SBP averages 150's. Believes his hx of DDD and chronic pain is contributing to this. Says he lives in 6-7 pain on 0-10 pain scale daily. Does not have machine for his sleep apnea as he says his preferred Pratt George Pratt) lost the order. Denies any chest pain, shortness of breath, palpitations, syncope, presyncope, dizziness, orthopnea, PND, swelling or significant weight changes, acute bleeding, or claudication.   01/25/2024 - Here for 1 year follow-up. Doing well and denies any acute cardiac complaints or concerns. Denies any chest pain, shortness of breath, palpitations, syncope, presyncope, dizziness, orthopnea, PND, swelling or significant weight changes, acute bleeding, or claudication. BP is well controlled at home.   SH: Former Environmental manager, currently works at [employer]. Father lived to be 30 and was pediatrician for many years.  FH: Dad- HTN  Studies Reviewed: George Pratt    EKG:  EKG Interpretation Date/Time:  Friday January 26 2024 10:07:42  EDT Ventricular Rate:  62 PR Interval:  204 QRS Duration:  110 QT Interval:  442 QTC Calculation: 448 R Axis:   100  Text Interpretation: Normal sinus rhythm Rightward axis When compared with ECG of 31-Jan-2023 11:08, No significant change was found Confirmed by George Pratt [phone]) on 01/26/2024 10:27:39 AM    Echo 07/2021:  1. Left ventricular ejection fraction, by estimation, is 55 to 60%. The  left ventricle has normal function. The left ventricle has no regional  wall motion abnormalities. Left ventricular diastolic parameters are  indeterminate. The average left  ventricular global longitudinal strain is -18.7 %. The global longitudinal  strain is normal.   2. Right ventricular systolic function is normal. The right ventricular  size is normal. Tricuspid regurgitation signal is inadequate for assessing  PA pressure.   3. The mitral valve is normal in structure. No evidence of mitral valve  regurgitation. No evidence of mitral stenosis.   4. The aortic valve is tricuspid. Aortic valve regurgitation is trivial.  No aortic stenosis is present.   5. Aortic dilatation noted. There is mild dilatation of the aortic root,  measuring 41 mm. There is mild dilatation of the ascending aorta,  measuring 38 mm.   6. The inferior vena cava is normal in size with greater than 50%  respiratory variability, suggesting right atrial pressure of 3 mmHg.   Comparison(s): LVEF 60-65%, Aortic Root 4.4cm.  Lexiscan  10/2017: Blood pressure demonstrated a hypertensive response to exercise. There was no ST segment deviation noted during stress. Defect 1: There is a medium defect of mild severity  present in the mid inferoseptal and apical inferior location. Images are improved on stress when compared to rest (reverse redistribution). Hence, findings appear to be due to soft tissue attenuation. No significant ischemic zones. Regional wall motion is normal. This is a low risk study. Nuclear stress EF:  61%.   Physical Exam:   VS:  BP 130/84   Pulse 62   Ht 6' 1 (1.854 m)   Wt 243 lb 6.4 oz (110.4 kg)   SpO2 99%   BMI 32.11 kg/m    Wt Readings from Last 3 Encounters:  01/26/24 243 lb 6.4 oz (110.4 kg)  02/09/23 241 lb (109.3 kg)  01/31/23 241 lb 6.4 oz (109.5 kg)    GEN: Well nourished, well developed in no acute distress NECK: No JVD; No carotid bruits CARDIAC: S1/S2, RRR, no murmurs, rubs, gallops RESPIRATORY:  Clear to auscultation without rales, wheezing or rhonchi  ABDOMEN: Soft, non-tender, non-distended EXTREMITIES:  No edema; No deformity   ASSESSMENT AND PLAN: .    Resistant HTN Longstanding hx of poorly controlled BP. Etiology multifactorial. Past renin/aldo levels and ratio normal. No evidence of RAS by prior CTA. Does have known OSA - followed by Pulmonology (see #4). BP at goal. No medication changes at this time. Discussed SBP goal < 140. Discussed to monitor BP at home at least 2 hours after medications and sitting for 5-10 minutes. Low salt, heart healthy diet and regular cardiovascular exercise encouraged.   HLD LDL 04/2023 was 68. Continue atorvastatin . Heart healthy diet and regular cardiovascular exercise encouraged. Continue to follow with PCP.  Aortic dilatation Echo 07/2021 showed mild dilatation of aortic root measuring 41 mm with mild dilatation of ascending aorta at 38 mm. Dr. Alvan stated in result note that not of concern, just something to continue to monitor over time. Previously consulted Dr. Alvan regarding timing of repeating study - stated okay to repeat in 1 year - will arrange.   OSA Continue to follow-up with pulmonology, PCP is managing arranging his CPAP.   Dispo: Follow-up with Dr. Dorn George or APP in 1 year or sooner if anything changes.   Signed, George Crate, NP

## 2024-01-31 ENCOUNTER — Telehealth: Payer: Self-pay | Admitting: Nurse Practitioner

## 2024-01-31 DIAGNOSIS — I77810 Thoracic aortic ectasia: Secondary | ICD-10-CM

## 2024-01-31 NOTE — Telephone Encounter (Signed)
-----   Message from Almarie Crate sent at 01/31/2024 10:00 AM EDT ----- Please arrange Echocardiogram for diagnosis of aortic dilatation - Dr. Alvan said okay to arrange.   Thanks!   Best, Almarie Crate, NP

## 2024-02-01 NOTE — Telephone Encounter (Signed)
 See MyChart message.  Patient has agreed to echo and order has been placed.  Sent for scheduling

## 2024-02-12 ENCOUNTER — Encounter (INDEPENDENT_AMBULATORY_CARE_PROVIDER_SITE_OTHER): Payer: Self-pay | Admitting: *Deleted

## 2024-02-12 ENCOUNTER — Ambulatory Visit: Payer: Medicare Other | Admitting: Family Medicine

## 2024-02-12 VITALS — BP 126/68 | HR 67 | Temp 98.0°F | Ht 73.0 in | Wt 251.0 lb

## 2024-02-12 DIAGNOSIS — D229 Melanocytic nevi, unspecified: Secondary | ICD-10-CM

## 2024-02-12 DIAGNOSIS — I10 Essential (primary) hypertension: Secondary | ICD-10-CM | POA: Diagnosis not present

## 2024-02-12 DIAGNOSIS — Z1211 Encounter for screening for malignant neoplasm of colon: Secondary | ICD-10-CM

## 2024-02-12 DIAGNOSIS — Z0000 Encounter for general adult medical examination without abnormal findings: Secondary | ICD-10-CM

## 2024-02-12 DIAGNOSIS — Z23 Encounter for immunization: Secondary | ICD-10-CM | POA: Diagnosis not present

## 2024-02-12 DIAGNOSIS — G4733 Obstructive sleep apnea (adult) (pediatric): Secondary | ICD-10-CM

## 2024-02-12 DIAGNOSIS — Z0001 Encounter for general adult medical examination with abnormal findings: Secondary | ICD-10-CM

## 2024-02-12 NOTE — Progress Notes (Signed)
 Subjective:    Patient ID: George Pratt, male    DOB: 1961-03-01, 63 y.o.   MRN: [id number]  Patient is here today for complete physical exam.  His last colonoscopy was in 2017.  They found a tubular adenoma.  His gastroenterologist at that time recommended a repeat colonoscopy in 7 years.  This is due therefore.  He also has numerous atypical moles on his back.  He states that he has never seen a dermatologist.  He is due for the pneumonia vaccine.  Shingles vaccine is up-to-date.  He is due for the flu shot.  His PSA was checked in June and was normal.  Cardiology is monitoring his ascending aortic aneurysm with an echocardiogram that is pending at the current time.  His blood pressure today is outstanding Past Medical History:  Diagnosis Date   Hyperlipemia    Hypertension    Sleep apnea    Past Surgical History:  Procedure Laterality Date   APPENDECTOMY  2006   COLONOSCOPY N/A 10/01/2015   Procedure: COLONOSCOPY;  Surgeon: Claudis RAYMOND Rivet, MD;  Location: [facility];  Service: Endoscopy;  Laterality: N/A;  1030   KNEE SURGERY     NECK SURGERY  2000   Current Outpatient Medications on File Prior to Visit  Medication Sig Dispense Refill   amLODipine  (NORVASC ) 10 MG tablet TAKE 1 TABLET BY MOUTH DAILY 90 tablet 3   aspirin  EC 81 MG tablet Take 1 tablet (81 mg total) by mouth daily.     atorvastatin  (LIPITOR) 40 MG tablet TAKE 1 TABLET BY MOUTH EVERY DAY 90 tablet 1   baclofen  (LIORESAL ) 10 MG tablet Take 1 tablet (10 mg total) by mouth daily as needed for muscle spasms. 30 each 0   carvedilol  (COREG ) 12.5 MG tablet Take 1 tablet (12.5 mg total) by mouth 2 (two) times daily. 180 tablet 3   hydrALAZINE  (APRESOLINE ) 100 MG tablet TAKE 1 TABLET BY MOUTH THREE TIMES DAILY 270 tablet 1   levocetirizine (XYZAL ) 5 MG tablet Take 1 tablet (5 mg total) by mouth every evening. 30 tablet 3   Multiple Vitamins-Minerals (ONE-A-DAY MENS 50+ ADVANTAGE PO) Take 1 tablet by mouth daily.     valsartan   (DIOVAN ) 320 MG tablet TAKE 1 TABLET BY MOUTH EVERY DAY 90 tablet 1   No current facility-administered medications on file prior to visit.   Allergies  Allergen Reactions   Bee Venom Swelling   Lisinopril  Cough   Social History   Socioeconomic History   Marital status: Married    Spouse name: Not on file   Number of children: Not on file   Years of education: Not on file   Highest education level: Master's degree (e.g., MA, MS, MEng, MEd, MSW, MBA)  Occupational History   Not on file  Tobacco Use   Smoking status: Never    Passive exposure: Never   Smokeless tobacco: Never  Vaping Use   Vaping status: Never Used  Substance and Sexual Activity   Alcohol use: No    Alcohol/week: 0.0 standard drinks of alcohol   Drug use: No   Sexual activity: Yes  Other Topics Concern   Not on file  Social History Narrative   Married.    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    --Is available for students to call him, email him, etc   Says it is for a group of multiple colleges--in [address], etc.   Social Drivers  of Health   Financial Resource Strain: Low Risk  (02/12/2024)   Overall Financial Resource Strain (CARDIA)    Difficulty of Paying Living Expenses: Not hard at all  Food Insecurity: No Food Insecurity (02/12/2024)   Hunger Vital Sign    Worried About Running Out of Food in the Last Year: Never true    Ran Out of Food in the Last Year: Never true  Transportation Needs: No Transportation Needs (02/12/2024)   PRAPARE - Administrator, Civil Service (Medical): No    Lack of Transportation (Non-Medical): No  Physical Activity: Insufficiently Active (02/12/2024)   Exercise Vital Sign    Days of Exercise per Week: 3 days    Minutes of Exercise per Session: 30 min  Stress: No Stress Concern Present (02/12/2024)   [employer] of Occupational Health - Occupational Stress Questionnaire    Feeling of Stress: Not at all  Social  Connections: Socially Integrated (02/12/2024)   Social Connection and Isolation Panel    Frequency of Communication with Friends and Family: More than three times a week    Frequency of Social Gatherings with Friends and Family: Once a week    Attends Religious Services: More than 4 times per year    Active Member of [employer] or Organizations: Yes    Attends Banker Meetings: 1 to 4 times per year    Marital Status: Married  Catering manager Violence: Not At Risk (02/09/2023)   Humiliation, Afraid, Rape, and Kick questionnaire    Fear of Current or Ex-Partner: No    Emotionally Abused: No    Physically Abused: No    Sexually Abused: No    Review of Systems  All other systems reviewed and are negative.      Objective:   Physical Exam Vitals reviewed.  Constitutional:      General: He is not in acute distress.    Appearance: Normal appearance. He is normal weight. He is not ill-appearing, toxic-appearing or diaphoretic.  Cardiovascular:     Rate and Rhythm: Normal rate and regular rhythm.     Heart sounds: Normal heart sounds. No murmur heard.    No friction rub.  Pulmonary:     Effort: Pulmonary effort is normal. No respiratory distress.     Breath sounds: Normal breath sounds. No stridor. No wheezing, rhonchi or rales.  Abdominal:     General: Bowel sounds are normal. There is no distension.     Palpations: Abdomen is soft.  Neurological:     Mental Status: He is alert.    Numerous atypical moles on his back and on his chest       Assessment & Plan:  Colon cancer screening - Plan: Ambulatory referral to Gastroenterology  OSA (obstructive sleep apnea) - Plan: For home use only DME continuous positive airway pressure (CPAP)  Benign essential HTN - Plan: CBC with Differential/Platelet, Comprehensive metabolic panel with GFR, Lipid panel  Multiple atypical skin moles - Plan: Ambulatory referral to Dermatology  Flu vaccine need - Plan: Flu vaccine HIGH DOSE  PF(Fluzone Trivalent), CANCELED: Flu vaccine trivalent PF, 6mos and older(Flulaval,Afluria,Fluarix,Fluzone)  General medical exam I placed an order for GI to contact the patient to schedule colonoscopy.  This appears to be due.  Also placed a repeat order for dermatology to consult the patient due to the multiple atypical moles on his back.  I believe he would benefit from seeing a dermatologist annually.  He received the flu shot today.  His blood  pressure is outstanding.  Check CBC CMP and fasting lipid panel.  I would like to keep his LDL cholesterol less than 899.  Also order the patient a CPAP for his obstructive sleep apnea.  He uses this nightly for more than 6 hours.  He benefits dramatically from this.  Patient states that he will return at a later date to receive the pneumonia vaccine

## 2024-02-12 NOTE — Addendum Note (Signed)
 Addended by: ANGELENA RONAL BRADLEY K on: 02/12/2024 09:32 AM   Modules accepted: Orders

## 2024-02-13 ENCOUNTER — Ambulatory Visit: Payer: Self-pay | Admitting: Family Medicine

## 2024-02-13 LAB — COMPREHENSIVE METABOLIC PANEL WITH GFR
AG Ratio: 2.4 (calc) (ref 1.0–2.5)
ALT: 22 U/L (ref 9–46)
AST: 16 U/L (ref 10–35)
Albumin: 4.7 g/dL (ref 3.6–5.1)
Alkaline phosphatase (APISO): 113 U/L (ref 35–144)
BUN: 12 mg/dL (ref 7–25)
CO2: 26 mmol/L (ref 20–32)
Calcium: 8.9 mg/dL (ref 8.6–10.3)
Chloride: 106 mmol/L (ref 98–110)
Creat: 0.93 mg/dL (ref 0.70–1.35)
Globulin: 2 g/dL (ref 1.9–3.7)
Glucose, Bld: 130 mg/dL — ABNORMAL HIGH (ref 65–99)
Potassium: 4 mmol/L (ref 3.5–5.3)
Sodium: 142 mmol/L (ref 135–146)
Total Bilirubin: 0.6 mg/dL (ref 0.2–1.2)
Total Protein: 6.7 g/dL (ref 6.1–8.1)
eGFR: 91 mL/min/1.73m2 (ref >=60)

## 2024-02-13 LAB — CBC WITH DIFFERENTIAL/PLATELET
Absolute Lymphocytes: 1241 {cells}/uL (ref 850–3900)
Absolute Monocytes: 572 {cells}/uL (ref 200–950)
Basophils Absolute: 70 {cells}/uL (ref 0–200)
Basophils Relative: 0.8 %
Eosinophils Absolute: 387 {cells}/uL (ref 15–500)
Eosinophils Relative: 4.4 %
HCT: 50.5 % — ABNORMAL HIGH (ref 38.5–50.0)
Hemoglobin: 16.7 g/dL (ref 13.2–17.1)
MCH: 30.3 pg (ref 27.0–33.0)
MCHC: 33.1 g/dL (ref 32.0–36.0)
MCV: 91.5 fL (ref 80.0–100.0)
MPV: 9.6 fL (ref 7.5–12.5)
Monocytes Relative: 6.5 %
Neutro Abs: 6530 {cells}/uL (ref 1500–7800)
Neutrophils Relative %: 74.2 %
Platelets: 267 Thousand/uL (ref 140–400)
RBC: 5.52 Million/uL (ref 4.20–5.80)
RDW: 13 % (ref 11.0–15.0)
Total Lymphocyte: 14.1 %
WBC: 8.8 Thousand/uL (ref 3.8–10.8)

## 2024-02-13 LAB — LIPID PANEL
Cholesterol: 115 mg/dL (ref <200)
HDL: 39 mg/dL — ABNORMAL LOW (ref >=40)
LDL Cholesterol (Calc): 59 mg/dL
Non-HDL Cholesterol (Calc): 76 mg/dL (ref <130)
Total CHOL/HDL Ratio: 2.9 (calc) (ref <5.0)
Triglycerides: 83 mg/dL (ref <150)

## 2024-02-15 ENCOUNTER — Ambulatory Visit: Payer: Medicare Other

## 2024-02-23 ENCOUNTER — Telehealth (INDEPENDENT_AMBULATORY_CARE_PROVIDER_SITE_OTHER): Payer: Self-pay

## 2024-02-23 NOTE — Telephone Encounter (Signed)
 Who is your primary care physician: Butler T. Pickard  Reasons for the colonoscopy: history of colon polyps  Have you had a colonoscopy before?  Yes, 2017  Do you have family history of colon cancer? no  Previous colonoscopy with polyps removed? yes  Do you have a history colorectal cancer?   no  Are you diabetic? If yes, Type 1 or Type 2?    no  Do you have a prosthetic or mechanical heart valve? no  Do you have a pacemaker/defibrillator?   no  Have you had endocarditis/atrial fibrillation? no  Have you had joint replacement within the last 12 months?  no  Do you tend to be constipated or have to use laxatives? yes  Do you have any history of drugs or alchohol?  no  Do you use supplemental oxygen?  no  Have you had a stroke or heart attack within the last 6 months? no  Do you take weight loss medication?  no  Do you take any blood-thinning medications such as: (aspirin , warfarin, Plavix, Aggrenox)  yes  If yes we need the name, milligram, dosage and who is prescribing doctor aspirin  81 mg, [facility] Current Outpatient Medications on File Prior to Visit  Medication Sig Dispense Refill   amLODipine  (NORVASC ) 10 MG tablet TAKE 1 TABLET BY MOUTH DAILY 90 tablet 3   aspirin  EC 81 MG tablet Take 1 tablet (81 mg total) by mouth daily.     atorvastatin  (LIPITOR) 40 MG tablet TAKE 1 TABLET BY MOUTH EVERY DAY 90 tablet 1   baclofen  (LIORESAL ) 10 MG tablet Take 1 tablet (10 mg total) by mouth daily as needed for muscle spasms. 30 each 0   carvedilol  (COREG ) 12.5 MG tablet Take 1 tablet (12.5 mg total) by mouth 2 (two) times daily. 180 tablet 3   hydrALAZINE  (APRESOLINE ) 100 MG tablet TAKE 1 TABLET BY MOUTH THREE TIMES DAILY 270 tablet 1   levocetirizine (XYZAL ) 5 MG tablet Take 1 tablet (5 mg total) by mouth every evening. 30 tablet 3   Multiple Vitamins-Minerals (ONE-A-DAY MENS 50+ ADVANTAGE PO) Take 1 tablet by mouth daily.     valsartan  (DIOVAN ) 320 MG tablet TAKE 1  TABLET BY MOUTH EVERY DAY 90 tablet 1   No current facility-administered medications on file prior to visit.    Allergies  Allergen Reactions   Bee Venom Swelling   Lisinopril  Cough     Pharmacy: [facility]  Primary Insurance Name: [facility] Advantage  Best number where you can be reached: [phone]

## 2024-02-26 ENCOUNTER — Ambulatory Visit: Attending: Nurse Practitioner

## 2024-02-26 DIAGNOSIS — I77810 Thoracic aortic ectasia: Secondary | ICD-10-CM

## 2024-02-26 NOTE — Telephone Encounter (Signed)
 Any room Thanks

## 2024-02-27 LAB — ECHOCARDIOGRAM COMPLETE
AR max vel: 2.66 cm2
AV Area VTI: 2.8 cm2
AV Area mean vel: 2.53 cm2
AV Mean grad: 6.7 mmHg
AV Peak grad: 13.7 mmHg
Ao pk vel: 1.85 m/s
Area-P 1/2: 3.28 cm2
Calc EF: 62 %
S' Lateral: 3.9 cm
Single Plane A2C EF: 49 %
Single Plane A4C EF: 73.2 %

## 2024-02-27 NOTE — Telephone Encounter (Signed)
 ATC pt to schedule TCS, no answer. LVM for call back.

## 2024-02-28 ENCOUNTER — Encounter (INDEPENDENT_AMBULATORY_CARE_PROVIDER_SITE_OTHER): Payer: Self-pay | Admitting: *Deleted

## 2024-02-28 MED ORDER — PEG 3350-KCL-NA BICARB-NACL 420 G PO SOLR: 4000.0000 mL | Freq: Once | ORAL | 0 refills | Status: AC

## 2024-02-28 NOTE — Telephone Encounter (Signed)
 Referral completed, TCS apt letter sent to PCP

## 2024-02-28 NOTE — Telephone Encounter (Signed)
 Pt called back. He has been scheduled for 03/15/24. Aware will mail instructions and rx for prep sent to pharmacy.

## 2024-02-28 NOTE — Addendum Note (Signed)
 Addended by: JEANELL GRAEME RAMAN on: 02/28/2024 03:13 PM   Modules accepted: Orders

## 2024-03-05 ENCOUNTER — Ambulatory Visit: Payer: Self-pay | Admitting: Nurse Practitioner

## 2024-03-05 ENCOUNTER — Ambulatory Visit: Admitting: Dermatology

## 2024-03-05 DIAGNOSIS — I77810 Thoracic aortic ectasia: Secondary | ICD-10-CM

## 2024-03-05 NOTE — Telephone Encounter (Signed)
-----   Message from Almarie Crate sent at 03/05/2024  1:05 PM EDT ----- His Echo shows significant aortic root dilatation. Please arrange CTA of chest for aortic aneurysm. Recent labs show normal kidney function.   Thanks!  Almarie Crate, AGNP-C ----- Message ----- From: Interface, [facility] Sent: 02/27/2024   9:55 AM EDT To: Almarie Crate, NP

## 2024-03-12 ENCOUNTER — Other Ambulatory Visit: Payer: Self-pay | Admitting: Cardiology

## 2024-03-13 ENCOUNTER — Encounter (HOSPITAL_COMMUNITY)
Admission: RE | Admit: 2024-03-13 | Discharge: 2024-03-13 | Disposition: A | Discharge status: Home / Self Care | Source: Ambulatory Visit | Attending: Gastroenterology | Admitting: Gastroenterology

## 2024-03-13 ENCOUNTER — Telehealth: Payer: Self-pay | Admitting: *Deleted

## 2024-03-13 ENCOUNTER — Encounter (INDEPENDENT_AMBULATORY_CARE_PROVIDER_SITE_OTHER): Payer: Self-pay | Admitting: Gastroenterology

## 2024-03-13 NOTE — Progress Notes (Signed)
 Colonoscopy will be cancelled and rescheduled after clearance from cardiology due to aortic root dilatation.  Will follow up with a CT Scan of chest for Aortic Aneurysm.

## 2024-03-13 NOTE — Telephone Encounter (Signed)
 Called pt, LMOVM to call back to make aware.

## 2024-03-13 NOTE — Telephone Encounter (Signed)
-----   Message from Nurse Randine MATSU sent at 03/13/2024 11:36 AM EDT ----- Regarding: cancellation/reschedule Good Morning!  Patient had an ECHO that showed aortic dilation and is being scheduled for a CT scan by cardiology. Anesthesia wants the case cancelled and rescheduled after the results of the CT scan.

## 2024-03-14 NOTE — Telephone Encounter (Signed)
 Spoke with pt and he stated he was already aware it was cancelled. He is awaiting to have the CT scheduled by cardiology.

## 2024-03-15 ENCOUNTER — Encounter (HOSPITAL_COMMUNITY): Admission: RE | Payer: Self-pay | Source: Home / Self Care

## 2024-03-15 ENCOUNTER — Ambulatory Visit (HOSPITAL_COMMUNITY): Admission: RE | Admit: 2024-03-15 | Source: Home / Self Care | Admitting: Gastroenterology

## 2024-03-15 ENCOUNTER — Telehealth: Payer: Self-pay | Admitting: Nurse Practitioner

## 2024-03-15 SURGERY — COLONOSCOPY
Anesthesia: Choice

## 2024-03-15 NOTE — Telephone Encounter (Signed)
 03/15/2024  CT ANGIO CHEST AORTA scheduled for 03-23-2024 At [facility]. Arrival time is 10:15.  LMOVM to patient regarding this appointment.

## 2024-03-15 NOTE — Telephone Encounter (Signed)
 Checking percert on the following patient for testing scheduled  At [facility]  CT ANGIO CHEST AORTA W/CM &OR  03/23/2024

## 2024-03-18 ENCOUNTER — Encounter: Payer: Self-pay | Admitting: Dermatology

## 2024-03-18 ENCOUNTER — Ambulatory Visit: Admitting: Dermatology

## 2024-03-18 DIAGNOSIS — D485 Neoplasm of uncertain behavior of skin: Secondary | ICD-10-CM | POA: Diagnosis not present

## 2024-03-18 DIAGNOSIS — L821 Other seborrheic keratosis: Secondary | ICD-10-CM

## 2024-03-18 DIAGNOSIS — D239 Other benign neoplasm of skin, unspecified: Secondary | ICD-10-CM

## 2024-03-18 DIAGNOSIS — L814 Other melanin hyperpigmentation: Secondary | ICD-10-CM | POA: Diagnosis not present

## 2024-03-18 DIAGNOSIS — L578 Other skin changes due to chronic exposure to nonionizing radiation: Secondary | ICD-10-CM

## 2024-03-18 DIAGNOSIS — D225 Melanocytic nevi of trunk: Secondary | ICD-10-CM | POA: Diagnosis not present

## 2024-03-18 DIAGNOSIS — D1801 Hemangioma of skin and subcutaneous tissue: Secondary | ICD-10-CM

## 2024-03-18 DIAGNOSIS — W908XXA Exposure to other nonionizing radiation, initial encounter: Secondary | ICD-10-CM

## 2024-03-18 DIAGNOSIS — D229 Melanocytic nevi, unspecified: Secondary | ICD-10-CM

## 2024-03-18 DIAGNOSIS — L905 Scar conditions and fibrosis of skin: Secondary | ICD-10-CM | POA: Diagnosis not present

## 2024-03-18 HISTORY — DX: Other benign neoplasm of skin, unspecified: D23.9

## 2024-03-18 NOTE — Progress Notes (Signed)
 New Patient Visit   Subjective  George Pratt is a 63 y.o. male who presents for the following: referral from PCP to check areas at back. No personal of fhx skin cancer.   Upper body skin check  The following portions of the chart were reviewed this encounter and updated as appropriate: medications, allergies, medical history  Review of Systems:  No other skin or systemic complaints except as noted in HPI or Assessment and Plan.  Objective  Well appearing patient in no apparent distress; mood and affect are within normal limits.   A focused examination was performed of the following areas: Back, chest, face, scalp, arms  Relevant exam findings are noted in the Assessment and Plan.  right lower back 1.4 cm irregular brown patch  Left Upper Back 7 mm dark brown macule   Right Flank 1.0 cm pink macule with central papular component    Assessment & Plan   HEMANGIOMA Exam: red papule(s) Discussed benign nature. Recommend observation. Call for changes.  MELANOCYTIC NEVI Exam: Tan-brown and/or pink-flesh-colored symmetric macules and papules  Treatment Plan: Benign appearing on exam today. Recommend observation. Call clinic for new or changing moles. Recommend daily use of broad spectrum spf 30+ sunscreen to sun-exposed areas.   SEBORRHEIC KERATOSIS - Stuck-on, waxy, tan-brown papules and/or plaques  - Benign-appearing - Discussed benign etiology and prognosis. - Observe - Call for any changes  ACTINIC DAMAGE - chronic, secondary to cumulative UV radiation exposure/sun exposure over time - diffuse scaly erythematous macules with underlying dyspigmentation - Recommend daily broad spectrum sunscreen SPF 30+ to sun-exposed areas, reapply every 2 hours as needed.  - Recommend staying in the shade or wearing long sleeves, sun glasses (UVA+UVB protection) and wide brim hats (4-inch brim around the entire circumference of the hat). - Call for new or changing  lesions.  Skin-colored plaque on central forehead: Patient reports forehead lesion was biopsied and found to be benign, possibly a skin tag NEOPLASM OF UNCERTAIN BEHAVIOR OF SKIN (3) right lower back Skin / nail biopsy Type of biopsy: tangential   Informed consent: discussed and consent obtained   Timeout: patient name, date of birth, surgical site, and procedure verified   Procedure prep:  Patient was prepped and draped in usual sterile fashion Prep type:  Isopropyl alcohol Anesthesia: the lesion was anesthetized in a standard fashion   Anesthetic:  1% lidocaine w/ epinephrine 1-100,000 buffered w/ 8.4% NaHCO3 Instrument used: DermaBlade   Hemostasis achieved with: pressure and aluminum chloride   Outcome: patient tolerated procedure well   Post-procedure details: sterile dressing applied and wound care instructions given   Dressing type: bandage and petrolatum    Specimen 1 - Surgical pathology Differential Diagnosis: Dysplastic Nevus vs Melanoma  Check Margins: No Left Upper Back Skin / nail biopsy Type of biopsy: tangential   Informed consent: discussed and consent obtained   Timeout: patient name, date of birth, surgical site, and procedure verified   Procedure prep:  Patient was prepped and draped in usual sterile fashion Prep type:  Isopropyl alcohol Anesthesia: the lesion was anesthetized in a standard fashion   Anesthetic:  1% lidocaine w/ epinephrine 1-100,000 buffered w/ 8.4% NaHCO3 Instrument used: DermaBlade   Hemostasis achieved with: pressure and aluminum chloride   Outcome: patient tolerated procedure well   Post-procedure details: sterile dressing applied and wound care instructions given   Dressing type: bandage and petrolatum    Specimen 2 - Surgical pathology Differential Diagnosis: Dysplastic Nevus vs Melanoma  Check  Margins: No Right Flank Skin / nail biopsy Type of biopsy: tangential   Informed consent: discussed and consent obtained   Timeout:  patient name, date of birth, surgical site, and procedure verified   Procedure prep:  Patient was prepped and draped in usual sterile fashion Prep type:  Isopropyl alcohol Anesthesia: the lesion was anesthetized in a standard fashion   Anesthetic:  1% lidocaine w/ epinephrine 1-100,000 buffered w/ 8.4% NaHCO3 Instrument used: DermaBlade   Hemostasis achieved with: pressure and aluminum chloride   Outcome: patient tolerated procedure well   Post-procedure details: sterile dressing applied and wound care instructions given   Dressing type: bandage and petrolatum    Specimen 3 - Surgical pathology Differential Diagnosis: Dysplastic Nevus vs Melanoma  Check Margins: No Discussed that patient has many abnormal-appearing nevi. Unclear where they fall on dysplastic spectrum. Jointly decided to sample three clinically different lesions to get a sense of potential pathology. Patient's father had many nevi on back CHERRY ANGIOMA   LENTIGINES   MULTIPLE BENIGN NEVI   ACTINIC ELASTOSIS   SEBORRHEIC KERATOSES    Return if symptoms worsen or fail to improve, for pending bx results.  LILLETTE Lonell Drones, RMA, am acting as scribe for Boneta Sharps, MD .   Documentation: I have reviewed the above documentation for accuracy and completeness, and I agree with the above.  Boneta Sharps, MD

## 2024-03-18 NOTE — Patient Instructions (Signed)

## 2024-03-22 LAB — SURGICAL PATHOLOGY

## 2024-03-22 NOTE — Progress Notes (Signed)
   03/22/2024  Patient ID: George Pratt, male   DOB: 05-Dec-1960, 63 y.o.   MRN: [id number]  Pharmacy Quality Measure Review  This patient is appearing on a report for being at risk of failing the adherence measure for cholesterol (statin) and hypertension (ACEi/ARB) medications this calendar year.   Medication: valsartan  and atorvastatin  Last fill date: 03/12/24 for 90 day supply (each medication)  Insurance report was not up to date. No action needed at this time.   Lang Sieve, PharmD, BCGP Clinical Pharmacist  [phone]

## 2024-03-23 ENCOUNTER — Ambulatory Visit (HOSPITAL_BASED_OUTPATIENT_CLINIC_OR_DEPARTMENT_OTHER)
Admission: RE | Admit: 2024-03-23 | Discharge: 2024-03-23 | Disposition: A | Discharge status: Home / Self Care | Source: Ambulatory Visit | Attending: Nurse Practitioner | Admitting: Nurse Practitioner

## 2024-03-23 DIAGNOSIS — E041 Nontoxic single thyroid nodule: Secondary | ICD-10-CM | POA: Diagnosis not present

## 2024-03-23 DIAGNOSIS — I7121 Aneurysm of the ascending aorta, without rupture: Secondary | ICD-10-CM | POA: Diagnosis not present

## 2024-03-23 DIAGNOSIS — R918 Other nonspecific abnormal finding of lung field: Secondary | ICD-10-CM | POA: Diagnosis not present

## 2024-03-23 DIAGNOSIS — I77810 Thoracic aortic ectasia: Secondary | ICD-10-CM | POA: Diagnosis not present

## 2024-03-23 LAB — POCT I-STAT CREATININE: Creatinine, Ser: 1 mg/dL (ref 0.61–1.24)

## 2024-03-23 MED ORDER — IOHEXOL 350 MG/ML SOLN
75.0000 mL | Freq: Once | INTRAVENOUS | Status: AC | PRN
  Administered 2024-03-23: 80 mL via INTRAVENOUS

## 2024-03-25 ENCOUNTER — Encounter: Payer: Self-pay | Admitting: Dermatology

## 2024-03-25 ENCOUNTER — Ambulatory Visit: Payer: Self-pay | Admitting: Dermatology

## 2024-03-25 NOTE — Telephone Encounter (Signed)
-----   Message from [place] sent at 03/25/2024  8:22 AM EDT ----- Please call to schedule excisions and 6 month fbse  1 and 2: back Biopsies show severely atypical moles that cannot be distinguished from early melanoma skin cancers. Recommend excision to ensure they are fully removed  3. Abdomen Biopsy shows moderately atypical mole. It is benign and doesn't need treatment ----- Message ----- From: Interface, [facility] Sent: 03/22/2024   5:15 PM EDT To: Boneta Sharps, MD

## 2024-03-25 NOTE — Telephone Encounter (Signed)
 Spoke with patient and advised of biopsy results. Excisions and 6 month TBSE scheduled for patient.

## 2024-03-26 ENCOUNTER — Ambulatory Visit: Payer: Self-pay | Admitting: Nurse Practitioner

## 2024-05-15 ENCOUNTER — Ambulatory Visit: Admitting: Dermatology

## 2024-05-15 ENCOUNTER — Encounter: Payer: Self-pay | Admitting: Dermatology

## 2024-05-15 DIAGNOSIS — D225 Melanocytic nevi of trunk: Secondary | ICD-10-CM

## 2024-05-15 DIAGNOSIS — D239 Other benign neoplasm of skin, unspecified: Secondary | ICD-10-CM

## 2024-05-15 MED ORDER — MUPIROCIN 2 % EX OINT: TOPICAL_OINTMENT | CUTANEOUS | 0 refills | Status: AC

## 2024-05-15 NOTE — Progress Notes (Signed)
° °  Follow-Up Visit   Subjective  George Pratt is a 63 y.o. 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  Objective  Well appearing patient in no apparent distress; mood and affect are within normal limits.  A focused examination was performed of the following areas: Left upper back  Relevant physical exam findings are noted in the Assessment and Plan.   Left Upper Back Healing bx site   Assessment & Plan   DYSPLASTIC NEVUS Left Upper Back - Skin excision - Left Upper Back  Excision method:  elliptical Lesion length (cm):  0.7 Margin per side (cm):  0.4 Total excision diameter (cm):  1.5 Informed consent: discussed and consent obtained   Timeout: patient name, date of birth, surgical site, and procedure verified   Procedure prep:  Patient was prepped and draped in usual sterile fashion Prep type:  Chlorhexidine Anesthesia: the lesion was anesthetized in a standard fashion   Anesthetic:  1% lidocaine w/ epinephrine 1-100,000 buffered w/ 8.4% NaHCO3 (15 cc) Instrument used: #15 blade   Hemostasis achieved with: suture, pressure and electrodesiccation   Outcome: patient tolerated procedure well with no complications   Additional details:  Superior Tag  - Skin repair - Left Upper Back Complexity:  Intermediate Final length (cm):  4.6 Informed consent: discussed and consent obtained   Timeout: patient name, date of birth, surgical site, and procedure verified   Procedure prep:  Patient was prepped and draped in usual sterile fashion Prep type:  Chlorhexidine Anesthesia: the lesion was anesthetized in a standard fashion   Anesthetic:  1% lidocaine w/ epinephrine 1-100,000  buffered w/ 8.4% NaHCO3 Reason for type of repair: reduce tension to allow closure, reduce the risk of dehiscence, infection, and necrosis, reduce subcutaneous dead space and avoid a hematoma, allow closure of the large defect and preserve normal anatomy   Undermining: edges could be approximated without difficulty   Subcutaneous layers (deep stitches):  Suture size:  3-0 Suture type: Vicryl (polyglactin 910)   Stitches:  Buried vertical mattress Fine/surface layer approximation (top stitches):  Suture size:  4-0 Suture type: Prolene (polypropylene)   Stitches comment:  Running locked Suture removal (days):  12 Hemostasis achieved with: suture, pressure and electrodesiccation Outcome: patient tolerated procedure well with no complications   Post-procedure details: sterile dressing applied and wound care instructions given   Dressing type: petrolatum, bandage and pressure dressing    Specimen 1 - Surgical pathology Differential Diagnosis: DYSPLASTIC COMPOUND NEVUS WITH SEVERE ATYPIA  Check Margins: Yes   [id number] Superior Tag   Return in about 12 days (around 05/27/2024) for suture removal.  I, Fay Kirks, CMA, am acting as scribe for Boneta Sharps, MD .   Documentation: I have reviewed the above documentation for accuracy and completeness, and I agree with the above.  Boneta Sharps, MD

## 2024-05-15 NOTE — Patient Instructions (Signed)
Wound Care Instructions for After Surgery  On the day following your surgery, you should begin doing daily dressing changes until your sutures are removed:  Remove the bandage.  Cleanse the wound gently with soap and water.   Make sure you then dry the skin surrounding the wound completely or the tape will not stick to the skin. Do not use cotton balls on the wound.  After the wound is clean and dry, apply the ointment (either prescription antibiotic prescribed by your doctor or plain Vaseline if nothing was prescribed) gently with a Q-tip.  If you are using a bandaid to cover:  Apply a bandaid large enough to cover the entire wound.  If you do not have a bandaid large enough to cover the wound OR if you are sensitive to bandaid adhesive:  Cut a non-stick pad (such as Telfa) to fit the size of the wound.   Cover the wound with the non-stick pad.  If the wound is draining, you may want to add a small amount of gauze on top of the non-stick pad for a little added compression to the area.  Use tape to seal the area completely.  For the next 1-2 weeks:  Be sure to keep the wound moist with ointment 24/7 to ensure best healing. If you are unable to cover the wound with a bandage to hold the ointment in place, you may need to reapply the ointment several times a day.  Do not bend over or lift heavy items to reduce the chance of elevated blood pressure to the wound.  Do not participate in particularly strenuous activities.  Below is a list of dressing supplies you might need.   Cotton-tipped applicators - Q-tips  Gauze pads (2x2 and/or 4x4) - All-Purpose Sponges  New and clean tube of petroleum jelly (Vaseline) OR prescription antibiotic ointment if prescribed  Either a bandaid large enough to cover the entire wound OR non-stick dressing material (Telfa) and Tape (Paper or Hypafix)  FOR ADULT SURGERY PATIENTS: If you need something for pain relief, you may take 1 extra strength  Tylenol (acetaminophen) and 2 ibuprofen (200 mg) together every 4 hours as needed. (Do not take these medications if you are allergic to them or if you know you cannot take them for any other reason). Typically you may only need pain medication for 1-3 days.   Comments on the Post-Operative Period Slight swelling and redness often appear around the wound. This is normal and will disappear within several days following the surgery. The healing wound will drain a brownish-red-yellow discharge during healing. This is a normal phase of wound healing. As the wound begins to heal, the drainage may increase in amount. Again, this drainage is normal. Notify us if the drainage becomes persistently bloody, excessively swollen, or intensely painful or develops a foul odor or red streaks.  The healing wound will also typically be itchy. This is normal. If you have severe or persistent pain, Notify us if the discomfort is severe or persistent. Avoid alcoholic beverages when taking pain medicine.  In Case of Wound Hemorrhage A wound hemorrhage is when the bandage suddenly becomes soaked with bright red blood and flows profusely. If this happens, sit down or lie down with your head elevated. If the wound has a dressing on it, do not remove the dressing. Apply pressure to the existing gauze. If the wound is not covered, use a gauze pad to apply pressure and continue applying the pressure for 20 minutes without   peeking. DO NOT COVER THE WOUND WITH A LARGE TOWEL OR WASH CLOTH. Release your hand from the wound site but do not remove the dressing. If the bleeding has stopped, gently clean around the wound. Leave the dressing in place for 24 hours if possible. This wait time allows the blood vessels to close off so that you do not spark a new round of bleeding by disrupting the newly clotted blood vessels with an immediate dressing change. If the bleeding does not subside, continue to hold pressure for 40 minutes. If bleeding  continues, page your physician, contact an After Hours clinic or go to the Emergency Room. ?  

## 2024-05-17 LAB — SURGICAL PATHOLOGY

## 2024-05-18 ENCOUNTER — Ambulatory Visit: Payer: Self-pay | Admitting: Dermatology

## 2024-05-20 NOTE — Telephone Encounter (Signed)
 Left pt msg to call for bx result/sh

## 2024-05-20 NOTE — Telephone Encounter (Signed)
Patient advised of BX result. aw

## 2024-05-20 NOTE — Telephone Encounter (Signed)
-----   Message from Boneta Sharps, MD sent at 05/18/2024 12:26 PM EST ----- Diagnosis left upper back :       NO RESIDUAL DYSPLASTIC NEVUS, MARGINS FREE   Please call to share that excision was clear of abnormal mole and get update on surgical wound. Thank you.

## 2024-05-27 ENCOUNTER — Ambulatory Visit: Admitting: Dermatology

## 2024-05-28 ENCOUNTER — Encounter: Payer: Self-pay | Admitting: Dermatology

## 2024-05-28 ENCOUNTER — Ambulatory Visit: Admitting: Dermatology

## 2024-05-28 DIAGNOSIS — Z5189 Encounter for other specified aftercare: Secondary | ICD-10-CM

## 2024-05-28 DIAGNOSIS — Z48817 Encounter for surgical aftercare following surgery on the skin and subcutaneous tissue: Secondary | ICD-10-CM

## 2024-05-28 DIAGNOSIS — Z4802 Encounter for removal of sutures: Secondary | ICD-10-CM

## 2024-05-28 NOTE — Progress Notes (Signed)
" ° °  Follow-Up Visit   Subjective  George Pratt is a 63 y.o. male who presents for the following: Suture removal  Pathology showed NO RESIDUAL DYSPLASTIC NEVUS, MARGINS FREE   The following portions of the chart were reviewed this encounter and updated as appropriate: medications, allergies, medical history  Review of Systems:  No other skin or systemic complaints except as noted in HPI or Assessment and Plan.  Objective  Well appearing patient in no apparent distress; mood and affect are within normal limits.  Areas Examined: back Relevant physical exam findings are noted in the Assessment and Plan.    Assessment & Plan   VISIT FOR WOUND CHECK   ENCOUNTER FOR REMOVAL OF SUTURES   Encounter for Removal of Sutures - Incision site is clean, dry and intact. - Wound cleansed, sutures removed - Discussed pathology results showing NO RESIDUAL DYSPLASTIC NEVUS, MARGINS FREE  - Scars remodel for a full year. - Patient can apply over-the-counter silicone scar cream once to twice a day to help with scar remodeling if desired. - Patient advised to call with any concerns or if they notice any new or changing lesions.  Return for Surgery, as scheduled, with Dr. Claudene.  Called patient to check on them after yesterday's surgery. Patient doing fine with no questions or concerns. Keep SR appt as scheduled. Lonell RAMAN., RMA   Documentation: I have reviewed the above documentation for accuracy and completeness, and I agree with the above.  Boneta Claudene, MD  "

## 2024-05-28 NOTE — Patient Instructions (Signed)

## 2024-05-31 ENCOUNTER — Other Ambulatory Visit: Payer: Self-pay | Admitting: Family Medicine

## 2024-05-31 ENCOUNTER — Other Ambulatory Visit: Payer: Self-pay | Admitting: Cardiology

## 2024-06-05 ENCOUNTER — Ambulatory Visit: Admitting: Dermatology

## 2024-06-05 ENCOUNTER — Encounter: Payer: Self-pay | Admitting: Dermatology

## 2024-06-05 DIAGNOSIS — D239 Other benign neoplasm of skin, unspecified: Secondary | ICD-10-CM

## 2024-06-05 DIAGNOSIS — D225 Melanocytic nevi of trunk: Secondary | ICD-10-CM | POA: Diagnosis not present

## 2024-06-05 MED ORDER — MUPIROCIN 2 % EX OINT: 1.0000 | TOPICAL_OINTMENT | Freq: Every day | CUTANEOUS | 1 refills | Status: AC

## 2024-06-05 NOTE — Patient Instructions (Signed)

## 2024-06-05 NOTE — Progress Notes (Signed)
" ° °  Follow-Up Visit   Subjective  George Pratt is a 64 y.o. 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  Objective  Well appearing patient in no apparent distress; mood and affect are within normal limits.  A focused examination was performed of the following areas: back Relevant physical exam findings are noted in the Assessment and Plan.   Right Lower Back Pink bx site  Assessment & Plan   DYSPLASTIC NEVUS Right Lower Back - Skin excision - Right Lower Back  Excision method:  elliptical Lesion length (cm):  0.8 Lesion width (cm):  1.4 Margin per side (cm):  0.4 Total excision diameter (cm):  1.6 Informed consent: discussed and consent obtained   Timeout: patient name, date of birth, surgical site, and procedure verified   Procedure prep:  Patient was prepped and draped in usual sterile fashion Prep type:  Chlorhexidine Anesthesia: the lesion was anesthetized in a standard fashion   Anesthetic:  1% lidocaine w/ epinephrine 1-100,000 buffered w/ 8.4% NaHCO3 (15 cc) Instrument used: #15 blade   Hemostasis achieved with: suture, pressure and electrodesiccation   Outcome: patient tolerated procedure well with no complications    - Skin repair - Right Lower Back Complexity:  Intermediate Final length (cm):  5 Informed consent: discussed and consent obtained   Timeout: patient name, date of birth, surgical site, and procedure verified   Procedure prep:  Patient was prepped and draped in usual sterile fashion Prep type:  Chlorhexidine Anesthesia: the lesion was anesthetized in a standard fashion   Anesthetic:  1% lidocaine w/ epinephrine 1-100,000 buffered w/  8.4% NaHCO3 Reason for type of repair: reduce tension to allow closure, reduce the risk of dehiscence, infection, and necrosis, reduce subcutaneous dead space and avoid a hematoma, allow closure of the large defect and preserve normal anatomy   Undermining: edges could be approximated without difficulty   Subcutaneous layers (deep stitches):  Suture size:  2-0 and 3-0 Suture type: Vicryl (polyglactin 910)   Stitches:  Buried vertical mattress Fine/surface layer approximation (top stitches):  Suture size:  4-0 Suture type: Prolene (polypropylene)   Stitches comment:  Running locked Suture removal (days):  13 Hemostasis achieved with: suture, pressure and electrodesiccation Outcome: patient tolerated procedure well with no complications   Post-procedure details: sterile dressing applied and wound care instructions given   Dressing type: petrolatum, bandage and pressure dressing    Specimen 1 - Surgical pathology Differential Diagnosis: BX proven DYSPLASTIC COMPOUND NEVUS WITH SEVERE ATYPIA, WITH SCAR  Check Margins: yes [id number] Lateral tag   Return in about 2 weeks (around 06/19/2024) for Suture Removal, with Dr. Claudene.  LILLETTE Lonell Drones, RMA, am acting as scribe for Boneta Claudene, MD .   Documentation: I have reviewed the above documentation for accuracy and completeness, and I agree with the above.  Boneta Claudene, MD  "

## 2024-06-06 LAB — SURGICAL PATHOLOGY

## 2024-06-09 ENCOUNTER — Ambulatory Visit: Payer: Self-pay | Admitting: Dermatology

## 2024-06-10 NOTE — Telephone Encounter (Signed)
 Patient advised pathology showed margins free, keep SR appt as scheduled. Lonell RAMAN., RMA

## 2024-06-10 NOTE — Telephone Encounter (Signed)
-----   Message from Boneta Sharps, MD sent at 06/09/2024  5:09 PM EST ----- Diagnosis right lower back :       NO RESIDUAL DYSPLASTIC NEVUS, MARGINS FREE   Please call to share that excision was clear of abnormal mole and get update on surgical wound. Thank you.

## 2024-06-20 ENCOUNTER — Ambulatory Visit: Admitting: Dermatology

## 2024-06-20 ENCOUNTER — Encounter: Payer: Self-pay | Admitting: Dermatology

## 2024-06-20 DIAGNOSIS — Z4802 Encounter for removal of sutures: Secondary | ICD-10-CM

## 2024-06-20 DIAGNOSIS — Z48817 Encounter for surgical aftercare following surgery on the skin and subcutaneous tissue: Secondary | ICD-10-CM

## 2024-06-20 DIAGNOSIS — Z5189 Encounter for other specified aftercare: Secondary | ICD-10-CM

## 2024-06-20 NOTE — Progress Notes (Signed)
" ° °  Follow-Up Visit   Subjective  George Pratt is a 64 y.o. male who presents for the following: Suture removal  Pathology showed right lower back :       NO RESIDUAL DYSPLASTIC NEVUS, MARGINS FREE   The following portions of the chart were reviewed this encounter and updated as appropriate: medications, allergies, medical history  Review of Systems:  No other skin or systemic complaints except as noted in HPI or Assessment and Plan.  Objective  Well appearing patient in no apparent distress; mood and affect are within normal limits.  Areas Examined:     Relevant physical exam findings are noted in the Assessment and Plan.    Assessment & Plan   VISIT FOR WOUND CHECK   ENCOUNTER FOR REMOVAL OF SUTURES   Encounter for Removal of Sutures - Incision site is clean, dry and intact. - Wound cleansed, sutures removed, wound cleansed and steri strips applied.  - Discussed pathology results showing right lower back :       NO RESIDUAL DYSPLASTIC NEVUS, MARGINS FREE  - Patient advised to keep steri-strips dry until they fall off. - Scars remodel for a full year. - Once steri-strips fall off, patient can apply over-the-counter silicone scar cream once to twice a day to help with scar remodeling if desired. - Patient advised to call with any concerns or if they notice any new or changing lesions.  Return for TBSE As Scheduled.    Documentation: I have reviewed the above documentation for accuracy and completeness, and I agree with the above.  Boneta Sharps, MD  "

## 2024-06-20 NOTE — Patient Instructions (Signed)

## 2024-09-23 ENCOUNTER — Ambulatory Visit: Admitting: Dermatology

## 2025-02-14 ENCOUNTER — Encounter: Admitting: Family Medicine
